# Patient Record
Sex: Male | Born: 1945 | Race: White | Hispanic: No | Smoking: Never smoker
Health system: Southern US, Community
[De-identification: ages and names within clinical notes are randomized; demographics above are authoritative.]

## PROBLEM LIST (undated history)

## (undated) DIAGNOSIS — F259 Schizoaffective disorder, unspecified: Secondary | ICD-10-CM

## (undated) DIAGNOSIS — F0390 Unspecified dementia without behavioral disturbance: Secondary | ICD-10-CM

## (undated) DIAGNOSIS — F250 Schizoaffective disorder, bipolar type: Secondary | ICD-10-CM

## (undated) DIAGNOSIS — I679 Cerebrovascular disease, unspecified: Secondary | ICD-10-CM

## (undated) DIAGNOSIS — R569 Unspecified convulsions: Secondary | ICD-10-CM

---

## 2005-07-22 ENCOUNTER — Inpatient Hospital Stay: Payer: Self-pay | Admitting: Unknown Physician Specialty

## 2006-10-27 ENCOUNTER — Ambulatory Visit: Payer: Self-pay | Admitting: Cardiovascular Disease

## 2008-06-09 ENCOUNTER — Ambulatory Visit: Payer: Self-pay | Admitting: Internal Medicine

## 2008-08-08 ENCOUNTER — Ambulatory Visit: Payer: Self-pay | Admitting: Gastroenterology

## 2008-11-16 ENCOUNTER — Ambulatory Visit: Payer: Self-pay | Admitting: Internal Medicine

## 2009-01-14 ENCOUNTER — Emergency Department: Payer: Self-pay | Admitting: Internal Medicine

## 2011-02-21 ENCOUNTER — Inpatient Hospital Stay: Payer: Self-pay | Admitting: Orthopedic Surgery

## 2011-03-07 ENCOUNTER — Emergency Department: Payer: Self-pay | Admitting: Emergency Medicine

## 2011-11-05 ENCOUNTER — Ambulatory Visit: Payer: Self-pay | Admitting: Urology

## 2011-11-05 DIAGNOSIS — I10 Essential (primary) hypertension: Secondary | ICD-10-CM

## 2011-11-05 LAB — BASIC METABOLIC PANEL
Anion Gap: 7 (ref 7–16)
BUN: 15 mg/dL (ref 7–18)
Chloride: 102 mmol/L (ref 98–107)
Co2: 33 mmol/L — ABNORMAL HIGH (ref 21–32)
EGFR (Non-African Amer.): >60
Osmolality: 283 (ref 275–301)
Potassium: 4.6 mmol/L (ref 3.5–5.1)
Sodium: 142 mmol/L (ref 136–145)

## 2011-11-05 LAB — HEMOGLOBIN: HGB: 15.6 g/dL (ref 13.0–18.0)

## 2011-11-12 ENCOUNTER — Ambulatory Visit: Payer: Self-pay | Admitting: Urology

## 2011-11-14 LAB — PATHOLOGY REPORT

## 2012-01-19 ENCOUNTER — Ambulatory Visit: Payer: Self-pay

## 2012-02-16 ENCOUNTER — Inpatient Hospital Stay: Payer: Self-pay | Admitting: Psychiatry

## 2012-02-16 LAB — CBC
HCT: 43.3 % (ref 40.0–52.0)
HGB: 14.8 g/dL (ref 13.0–18.0)
MCHC: 34.3 g/dL (ref 32.0–36.0)
RBC: 4.79 10*6/uL (ref 4.40–5.90)
WBC: 5.5 10*3/uL (ref 3.8–10.6)

## 2012-02-16 LAB — SALICYLATE LEVEL: Salicylates, Serum: <1.7 mg/dL

## 2012-02-16 LAB — TSH: Thyroid Stimulating Horm: 2.01 u[IU]/mL

## 2012-02-16 LAB — COMPREHENSIVE METABOLIC PANEL
Anion Gap: 9 (ref 7–16)
BUN: 17 mg/dL (ref 7–18)
Bilirubin,Total: 0.4 mg/dL (ref 0.2–1.0)
Calcium, Total: 9.4 mg/dL (ref 8.5–10.1)
Chloride: 105 mmol/L (ref 98–107)
Co2: 26 mmol/L (ref 21–32)
Creatinine: 0.69 mg/dL (ref 0.60–1.30)
EGFR (African American): >60
EGFR (Non-African Amer.): >60
Potassium: 3.9 mmol/L (ref 3.5–5.1)
SGOT(AST): 46 U/L — ABNORMAL HIGH (ref 15–37)
SGPT (ALT): 45 U/L (ref 12–78)
Sodium: 140 mmol/L (ref 136–145)
Total Protein: 7.1 g/dL (ref 6.4–8.2)

## 2012-02-16 LAB — DRUG SCREEN, URINE
Amphetamines, Ur Screen: NEGATIVE (ref ?–1000)
Barbiturates, Ur Screen: NEGATIVE (ref ?–200)
Cocaine Metabolite,Ur ~~LOC~~: NEGATIVE (ref ?–300)
MDMA (Ecstasy)Ur Screen: NEGATIVE (ref ?–500)
Methadone, Ur Screen: NEGATIVE (ref ?–300)
Opiate, Ur Screen: NEGATIVE (ref ?–300)
Phencyclidine (PCP) Ur S: NEGATIVE (ref ?–25)
Tricyclic, Ur Screen: NEGATIVE (ref ?–1000)

## 2012-02-16 LAB — ETHANOL: Ethanol: <3 mg/dL

## 2012-02-16 LAB — VALPROIC ACID LEVEL: Valproic Acid: 74 ug/mL

## 2012-02-16 LAB — ACETAMINOPHEN LEVEL: Acetaminophen: <2 ug/mL

## 2012-02-22 LAB — VALPROIC ACID LEVEL: Valproic Acid: 93 ug/mL

## 2012-02-29 LAB — VALPROIC ACID LEVEL: Valproic Acid: 97 ug/mL

## 2014-07-12 NOTE — Op Note (Signed)
PATIENT NAME:  Mason May, Mason May MR#:  [id number] DATE OF BIRTH:  1945-10-02  DATE OF PROCEDURE:  11/12/2011  PREOPERATIVE DIAGNOSIS: Bladder tumor.   POSTOPERATIVE DIAGNOSIS: Bladder tumor.  PROCEDURES: Cystoscopy, bladder biopsy, and mitomycin bladder instillation.   SURGEON: Assunta GamblesBrian [place], M.D.   ANESTHESIA: Laryngeal mask airway anesthesia.   INDICATIONS: The patient is a 69 year old gentleman who presented for evaluation of hematuria and detrusor instability. On examination he was noted to have an approximate 1 to 1.5 cm right lateral wall papillary-appearing tumor consistent with early papillary transitional cell carcinoma. He presents for biopsy and mitomycin bladder instillation.   DESCRIPTION OF PROCEDURE: After informed consent was obtained, the patient was taken to the Operating Room and placed in the dorsal lithotomy position under laryngeal mask airway anesthesia. The patient was then prepped and draped in the usual standard fashion. The 22 French rigid cystoscope was introduced into the urethra under direct vision. A urethral false passage was noted at the 5 o'clock position in mid bulbar urethra. Upon entering the prostate, moderate bilobar hypertrophy was noted with partial visual obstruction. Upon entering the bladder, the mucosa was inspected in its entirety. Several small diverticula were noted with the predominate being on the left lateral wall. Just in front of the area of the diverticulum was an approximate 1 to 1.5 cm papillary-appearing lesion consistent with early papillary transitional cell carcinoma. No other lesions were noted throughout the bladder. Prominent trabeculation was present. Cold cup biopsy forceps were then utilized to remove the lesion. The area was then extensively cauterized up to the opening of the diverticulum. No significant bleeding was encountered. The bladder was drained, the cystoscope was removed, and an 18 French red rubber catheter was then placed without  difficulty. 20 mg of mitomycin reconstituted in 50 mL of sterile water was administered into the bladder. The catheter was then removed. The patient was     returned to the supine position and awakened from laryngeal mask airway anesthesia. He was taken to the recovery room in stable condition. There were no problems or complications. The patient tolerated the procedure well. ____________________________ Madolyn FriezeBrian S. Achilles Dunkope, MD bsc:slb D: 11/12/2011 08:22:42 ET T: 11/12/2011 10:47:26 ET JOB#: [id number]  cc: Madolyn FriezeBrian S. Achilles Dunkope, MD, <Dictator> Madolyn FriezeBRIAN S Lessly Stigler MD ELECTRONICALLY SIGNED 11/14/2011 8:59

## 2014-07-12 NOTE — Consult Note (Signed)
Brief Consult Note: Diagnosis: 1. LE edema - likely dependent edema (no evidence of cellulitis) 2. Schizophrenia 3. hx of CAD 4. HTN 5. Hypothyroidism 6. Restless leg syndrome. 7. BPH 8. Urinary Incontinence.   Patient was seen by consultant.   Consult note dictated.   Orders entered.   Comments: 69 yo  male w/ hx of Schizophrenia, HTN, CAD, Hyperlipidemia, restless leg syndrome, hypothyroidism, BPH, Urinary incotinence came into hospital due to auditory hallucinations/delusions due to schizophrenia.    1. LE swelling/redness - unlikely cellulitis but likely dependent edema from PVD.   - will get Doppler to r/o DVt.  [facility] and keep leg elevated when not ambulating.  - no abx at this time.  2. HTN - cont. Metoprolol, HCTZ 3. Hypothyroidism - cont. synthroid.  4. Restless leg - cont. Requip.  5. BPH - cont. Flomax.   6. Schizophrenia/delusions - cont. care as per Psych.   Full Code Thanks for the consult.  Job # [id number].  Electronic Signatures: [place] SirenSainani, Vivek J (MD)  (Signed ([phone]-Nov-13 17:12)  Authored: Brief Consult Note   Last Updated: 30-Nov-13 17:12 by [place] SirenSainani, Vivek J (MD)

## 2014-07-12 NOTE — Discharge Summary (Signed)
PATIENT NAME:  Quinteros, Mason May MR#:  [id number] DATE OF BIRTH:  [birth year]  DATE OF ADMISSION:  02/16/2012 DATE OF DISCHARGE:  03/04/2012  HOSPITAL COURSE: See dictated history and physical for details of admission. This 69 year old man was brought to the hospital under involuntary commitment because of increase in paranoid behavior and agitation with conflict with his neighbors. He had become convinced that people were somehow piping gases into his house that he could smell and had become more disruptive in his milieu, was frequently calling the law. In the hospital the patient was initially quite disorganized and bizarre in his appearance and behavior. He was also fairly agitated and belligerent at times. He was not violent or threatening, however, and at no time voiced any suicidal ideation. Psychiatrically he was treated with medication as well as individual and group therapy focusing on psychoeducation and cognitive and supportive therapy. His medications primarily have been Seroquel and Depakote. We have tried to find a dosage of medication that will treat his psychotic manic symptoms but also be agreeable to the patient and cause as few side effects as possible. After some titration we seem to have achieved that. He seems to only be able to tolerate 200 mg total of Seroquel a day without getting oversedated. He particularly is very sedated if he takes any in the morning and is very resistant to increasing the nighttime dose. Higher doses of Depakote also seem to cause some slurring of his speech and weakness. He is tolerating his current medication profile well, however. He has become more appropriate in his appearance and shows adequate ability to take care of himself. He is not reporting having any hallucinations and does not appear paranoid currently. He is agreeable to following up with Dr. Janeece RiggersSu in the community. As far as his medical treatment, we have continued to treat his blood pressure, his urinary  incontinence and his hypercholesterolemia as well as his chronic obstructive pulmonary disease, hypothyroidism and gastric reflux and restless legs. Some minor changes have been made in medication but overall he has stayed stable. Patient understands his medical condition and is agreeable to staying on all of his medicine and to following up with his physician's assistant in the community. At the time of discharge he is not suicidal, aggressive, violent and is agreeable to the outpatient treatment plan.   DISCHARGE MEDICATIONS:  1. Quetiapine 200 mg at bedtime.  2. Colace 200 mg twice a day.  3. Depakote 500 mg twice a day.  4. Lorazepam 2 mg every six hours p.r.n. for anxiety. I have educated the patient that he should not take this medicine more than twice a day at most and should keep his doses at least six preferably eight hours apart and should only take it if he is feeling panicky in a way that he cannot manage on his own.  5. Flomax 0.4 mg per day.  6. Requip 2 mg per day, which he prefers to take in the morning.  7. Ditropan 5 mg twice a day.  8. Prilosec 20 mg in the morning.  9. Synthroid 50 mcg per day.  10. Hydrochlorothiazide 25 mg per day.  11. Advair Diskus 1 puff twice a day.  12. Zetia 10 mg per day.  13. Celebrex 200 mg per day.   LABORATORY, DIAGNOSTIC AND RADIOLOGICAL DATA: Admission labs done on the 24th showed a drug screen that was negative, valproic acid level of 74, TSH 2.01 which is normal, alcohol undetectable, chemistry panel without significant abnormalities.  Slightly elevated AST, but only at 46. Hematology panel entirely normal. Acetaminophen and salicylates undetectable. Blood gas showed pO2 slightly low at 65. I am not sure if that really is an accurate test given that it does not reflect any problems with shortness of breath he is having. CT of the head was done and showed chronic involutional changes but no acute abnormality, valproic acid level retested on 11/30  was 93. it was 97 on 12/07. Ultrasound was done of the lower extremity to rule out deep vein thrombosis and did not show any sign of a thrombus.   DISCHARGE MENTAL STATUS EXAMINATION: Neatly dressed and groomed. Good eye contact. Normal psychomotor activity. Speech normal in rate, tone, and volume. Affect somewhat blunted but not grossly bizarre. Mood stated as being good. Thoughts are lucid without any loosening of associations or delusional thinking. Denies auditory or visual hallucinations. Denies any olfactory hallucinations. Denies suicidal or homicidal ideation. Judgment and insight are improved. Baseline intelligence normal. Short and long-term memory intact. Alert and oriented x4.   DISPOSITION: Discharge back to independent living. His sister will be taking him home. He is to follow-up with Dr. Janeece Riggers and with his physician's assistant for primary care.   DIAGNOSIS PRINCIPLE AND PRIMARY: AXIS I: Bipolar disorder, manic, with psychotic features, now resolved.   SECONDARY DIAGNOSES:  AXIS I: No further.   AXIS II: Deferred.   AXIS III:  1. Hypertension.  2. Elevated cholesterol.  3. Chronic obstructive pulmonary disease. 4. Chronic pain in his left knee and ankle. 5. Hypothyroidism. 6. Gastric reflux symptoms.  7. Constipation. 8. Urinary incontinence probably related to prostate problem.   AXIS IV: Moderate. Chronic stress from isolation and mental illness.   AXIS V: Functioning at time of discharge 60.   ____________________________ Audery Amel, MD jtc:cms D: 03/04/2012 11:59:55 ET T: 03/04/2012 12:10:18 ET JOB#: [id number]  cc: Audery Amel, MD, <Dictator> Audery Amel MD ELECTRONICALLY SIGNED 03/04/2012 13:55

## 2014-07-12 NOTE — H&P (Signed)
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 Patient reports in 1996 when his wife died he started living by himself in a one bedroom apartment. Patient comes for inpatient [facility] in [facility] with a chief complaint "It is not my problem. It is my neighbor's problem. They think something is wrong with me. I don't even pass by them. I don't even walk around them and I have been living there longer than them."   HISTORY OF PRESENT ILLNESS: According to information obtained from the chart patient had been paranoid and agitated and hostile and deranging property. For several weeks he has been smelling vapors coming through the wall of his apartment. He stated that his neighboring apartment was using gas but that now they are using hair spray. He has woken his other neighbors in the middle of the night and has called the fire department out to the residence on multiple occasions. He was shown several times that there is nothing in the apartment that he is talking about and nothing has been discovered to come from the presence of any harmful substances. There are no smells that officers or fire fighters have been able to detect. He has jammed clothes hangers in his windows and doors and has attempted to stuff things on the residences of the neighbors. He has also been trying to get to  his neighbors with clothes hangers. He is believed paranoid and is dangerous to himself or neighbors. Patient absolutely denies all these statements and he said the neighbors came in after he was living there and they are accusing him of things that he has not done. In fact patient has been wearing dark glasses and reports that there is some vision  problem and he is sensitive to the light and he has been told the same by his physicians.  PAST PSYCHIATRIC HISTORY: Patient reports that his first inpatient hospitalization in psychiatry [place] when he admitted himself on a voluntary basis for evaluation in 1969. He was inpatient for two months. Longest period of inpatient hospitalization in psychiatry [place] two months in [place]. Last inpatient hospitalization was in 2007 to [facility] when he was a patient of Dr. Alycia Rossettiyan and was sent to state hospital for further help and he did not like it. He tried to kill himself on one occasion when he tried to overdose of pills. Currently he is being [place]. Last appointment a couple of weeks ago and next appointment is on 02/18/2012. Patient reports that Seroquel and Depakote are the medications that helped him the best.   FAMILY HISTORY OF MENTAL ILLNESS: Not known for mental illness. No known history of suicides in the family.   FAMILY HISTORY: Raised by mother until they were divorced and then by father and stepmother. Father was a Psychologist, occupationalwelder. Father died of cancer and he does not know his age. Mother worked for an Scientist, forensicinsurance company and an Film/video editoroffice worker. Stepmother was a Armed forces operational officerdental hygienist. Mother and stepmother died. Has four living sisters and three living brothers. Not close to them as they all live in [place].  PERSONAL HISTORY: [place], [place] most of the time. Moved  to [place] in 2005 to be closer to his siblings who lived here at that time. Graduated from high school. One year of college in [place], [place] and quit because it did not work out as he was playing baseball and had to spend most of the time playing baseball and had to work 20 hours a week to keep himself going.   WORK HISTORY: First job cigarettes Customer service manager at 20 years. This job lasted for two months. Quit because of having to  move and difficulties with living situation. 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   MARRIAGES: Married once. Marriage lasted for 12 years until she died. No children because she could not have any children but had two stepchildren.  ALCOHOL AND DRUGS: First drink of alcohol never. Denies street or prescription drug abuse. Denies smoking nicotine cigarettes.   PAST MEDICAL HISTORY: Mild hypertension which is labile. No known history of diabetes mellitus. Status post arthroscopy surgery on left knee. Status post surgery for pilonidal cyst removal on the back many years ago. No history of motor vehicle accident, never been unconscious. No known drug allergies. Was being followed at [facility] by Dr. Mariana Kaufman. Last appointment was with the PA about a week ago. Next appointment is on 03/05/2012. He is on Flomax, aspirin 81 mg every day, multivitamin, metoprolol, levothyroxine for hypothyroidism.     PHYSICAL EXAMINATION: VITAL SIGNS: Temperature 97.6, pulse 90 per minute regular, respirations 20 per minute regular, blood pressure 140/90 mmHg.   HEENT: Head is normocephalic, atraumatic. Pupils are equal, round, and reactive to light and accommodation. Fundi bilaterally benign. Extraocular movements visualized. Tympanic membrane visualized, no exudates. Edentulous, moist.   NECK: Supple without any organomegaly, lymphadenopathy, thyromegaly.  CHEST: Normal expansion. Normal breath sounds heard.   ABDOMEN: Soft. No organomegaly. Bowel sounds heard.   RECTAL: Deferred.  NEUROLOGICAL: Gait is normal. Romberg is negative. Cranial nerves II through XII grossly intact. DTRs 2+ and normal. Plantars normal response.  MENTAL STATUS EXAM: Patient is dressed in street clothes, alert and oriented to place, person and time, pleasant, calm and cooperative. No agitation.  Affect is appropriate with his mood which is not depressed and he denies feeling depressed and he smiled while talking about the same. Admits that his neighbors are all against him and he moved in a long time before them and they are talking about him and are against him. In fact he is wearing dark glasses and said that he is sensitive to the light and he was told by his doctors about the same. When he was asked about his behavior he reports that he did not do it and it was correct that there was some smell coming and he had to take care of it. He is paranoid and suspicious about people around him. Denies any paranoid or suspicious ideas and reports that these are all true things and in fact his neighbors are paranoid about him. He could spell the word world forward and backward. He could do serial sevens. He could count money. He could tell his date of birth. Denies any ideas or plans to hurt himself or others. Insight and judgment guarded/impaired.  IMPRESSION: AXIS I: Schizophrenia, chronic paranoid exacerbation.   AXIS II: Deferred.  AXIS III: 1. Status post arthroscopic surgery of his left knee-remote.  2. Hypertension-mild.  3.  Hypothyroidism.   AXIS IV: Severe-occupational, financial, long history of mental illness and noncompliance with medication.  AXIS V: Global Assessment of Functioning 25.  PLAN: Patient admitted to [facility] for close observation, evaluation and help. He will be started back on all of his medications and he will be stabilized. During the stay in the hospital compliance issues will be addressed. Social Services will try to contact mental health about his follow-up appointment and compliance issues. He will be stabilized and appropriate follow-up appointment will be made at the time of discharge.   ____________________________ Jannet Mantis. Guss Bunde, MD skc:cms D: 02/16/2012 18:48:00 ET T: 02/17/2012 07:04:16 ET JOB#: [id number]  cc: Monika Salk K. Guss Bunde, MD,  <Dictator> Beau Fanny MD ELECTRONICALLY SIGNED 02/18/2012 20:06

## 2014-07-12 NOTE — Consult Note (Signed)
PATIENT NAME:  Mason May, Mason May MR#:  [id number] DATE OF BIRTH:  [birth year]  DATE OF CONSULTATION:  02/22/2012  REFERRING PHYSICIAN:  Jolanta B. Jennet Maduro, MD  CONSULTING PHYSICIAN:  Rolly Pancake. Cherlynn Kaiser, MD PRIMARY CARE PHYSICIAN: Mason [place], MD    REASON FOR CONSULTATION: Lower extremity swelling and redness.   HISTORY OF PRESENT ILLNESS: [name] who was admitted to Behavioral Medicine for schizophrenia with auditory hallucinations and delusions. Hospitalist Services were contacted as he has been having some lower extremity swelling and redness, more on the left compared to the right, with also some red areas suspicious for cellulitis. The patient is a poor historian given his underlying psychiatric illness. As per the patient, he has been having swelling in his left leg for about a month or so, and the right leg started to get swollen maybe about a week or so ago. He does not complain of any pain on ambulation. He does note that there are some red areas which have appeared. He denies any fevers, chills, nausea, vomiting, abdominal pain or any other associated symptoms presently.   REVIEW OF SYSTEMS: CONSTITUTIONAL: No documented fever. No weight gain, no weight loss. EYES: No blurry or double vision. ENT: No tinnitus, no postnasal drip, no redness of the oropharynx. RESPIRATORY: No cough, no wheeze, no hemoptysis. CARDIOVASCULAR: No chest pain, no orthopnea, no palpitations, no syncope. GASTROINTESTINAL: No nausea, no vomiting, no diarrhea, no abdominal pain, no melena or hematochezia. GU: No dysuria, no hematuria. ENDOCRINE: No polyuria or nocturia. No heat or cold intolerance. HEMATOLOGIC/LYMPHATIC: No anemia, no bruising, no bleeding. INTEGUMENTARY: No rashes, no lesions. MUSCULOSKELETAL: No arthritis, no swelling, no gout. NEUROLOGIC: No numbness, tingling, no ataxia, no seizure-type activity. PSYCHIATRIC: No anxiety, no insomnia, no ADD.   PAST MEDICAL HISTORY:  1. Coronary artery  disease.  2. Hypertension.  3. Hypothyroidism.  4. Restless leg syndrome.  5. Benign prostatic hypertrophy.  6. Urinary incontinence.  7. Schizophrenia.  8. Hyperlipidemia.   ALLERGIES: No known drug allergies.   SOCIAL HISTORY: No smoking. No alcohol abuse. No illicit drug abuse. He lives by himself.   FAMILY HISTORY: [name] died from complications of congestive heart failure. Father died from a malignancy of unknown type.   CURRENT MEDICATIONS:  1. Aspirin 81 mg daily.  2. Benztropine 1 mg b.i.d.  3. Celebrex 200 mg daily.  4. Cymbalta 30 mg daily.  5. Depakote 5 mg b.i.d.  6. Colace 100 mg daily.  7. Metoprolol tartrate 25 mg b.i.d.  8. Omeprazole 20 mg daily.  9. Oxybutynin 15 mg b.i.d.  10. Requip 2 mg at bedtime.  11. Risperidone 2 mg at bedtime.  12. Seroquel 100 mg at bedtime.  13. Synthroid 50 mcg daily.  14. Flomax 0.4 mg in the morning and also at bedtime.  15. Zetia 10 mg daily.   PHYSICAL EXAMINATION:  VITAL SIGNS: Presently, temperature is 97.7, pulse 75, respirations 20, blood pressure 143/71.   GENERAL: The patient is a pleasant-appearing male in no apparent distress.   HEENT: Atraumatic, normocephalic. Extraocular muscles are intact. Pupils are equal and reactive to light. Sclerae are anicteric. No conjunctival injection. No pharyngeal erythema.   NECK: Supple. There is no jugular venous distention, no bruits, no lymphadenopathy. No thyromegaly.   HEART: Regular rate and rhythm. No murmurs, rubs, or clicks.   LUNGS: Clear to auscultation bilaterally. No rales, no rhonchi, no wheezes.   ABDOMEN: Soft, flat, nontender, nondistended. Good bowel sounds. No hepatosplenomegaly appreciated.  EXTREMITIES: No evidence of any cyanosis or clubbing. Does have +2 pitting edema from the knees to the ankles bilaterally. He also has some excoriations on his lower extremities bilaterally which appear like scratches.   SKIN: Moist and warm with no  rashes.   LYMPHATIC: There is no cervical or axillary lymphadenopathy.   NEUROLOGICAL: He is alert, awake, and oriented x3 with no focal motor or sensory deficits appreciated bilaterally.   ASSESSMENT AND PLAN: [name] with a history of schizophrenia, hypertension, coronary artery disease, hyperlipidemia, restless leg syndrome, hypothyroidism, benign prostatic hypertrophy, urinary incontinence, who presented to the hospital due to auditory  hallucinations, delusions, admitted to Behavioral Medicine. Hospitalist Services were contacted for lower extremity edema and redness.   1. Lower extremity edema and redness: This is likely dependent edema from peripheral vascular disease and poor mobility, unlikely cellulitis. The patient is afebrile, does not have an elevated Zalenski cell count. His leg is not fluctuant or red consistent with cellulitis. I will get Dopplers of his lower extremities to just rule out a deep vein thrombosis. I will place a TED hose from the knees to the ankles bilaterally and tell him to keep his leg elevated when not ambulating. I will not prescribe any antibiotics at this time.  2. Hypertension: He is presently hemodynamically stable. I will continue his metoprolol and hydrochlorothiazide.  3. Hypothyroidism: Continue with his Synthroid.  4. Restless leg syndrome: Continue Requip. 5. Benign prostatic hypertrophy: Continue Flomax.  6. Schizophrenia/delusions: Continue care as per Psychiatry.   CODE STATUS:  The patient is a FULL CODE.    Thank you so much for the consultation. I will sign off for now. Call back if any further help is needed. If the patient's Doppler of his lower extremities are positive, I will come back and re-evaluate the patient.   TIME SPENT WITH CONSULT:   45 minutes.   ____________________________ Rolly PancakeVivek J. Cherlynn KaiserSainani, MD vjs:cbb D: 02/22/2012 17:13:37 ET T: 02/23/2012 11:11:55 ET JOB#: [id number]  cc: Rolly PancakeVivek J. Cherlynn KaiserSainani, MD,  <Dictator> Vic RipperPaul C. Mariana Kaufmanobin, MD [place] SirenVIVEK J SAINANI MD ELECTRONICALLY SIGNED 02/27/2012 20:31

## 2016-03-26 ENCOUNTER — Emergency Department: Payer: Medicare Other

## 2016-03-26 ENCOUNTER — Encounter: Payer: Self-pay | Admitting: Emergency Medicine

## 2016-03-26 ENCOUNTER — Inpatient Hospital Stay
Admission: EM | Admit: 2016-03-26 | Discharge: 2016-04-01 | DRG: 871 | Disposition: A | Discharge status: Skilled Nursing Facility | Payer: Medicare Other | Attending: Internal Medicine | Admitting: Internal Medicine

## 2016-03-26 DIAGNOSIS — J189 Pneumonia, unspecified organism: Secondary | ICD-10-CM | POA: Diagnosis present

## 2016-03-26 DIAGNOSIS — A419 Sepsis, unspecified organism: Secondary | ICD-10-CM | POA: Diagnosis not present

## 2016-03-26 DIAGNOSIS — F250 Schizoaffective disorder, bipolar type: Secondary | ICD-10-CM | POA: Diagnosis not present

## 2016-03-26 DIAGNOSIS — F259 Schizoaffective disorder, unspecified: Secondary | ICD-10-CM | POA: Diagnosis present

## 2016-03-26 DIAGNOSIS — E872 Acidosis: Secondary | ICD-10-CM | POA: Diagnosis present

## 2016-03-26 DIAGNOSIS — E039 Hypothyroidism, unspecified: Secondary | ICD-10-CM | POA: Diagnosis present

## 2016-03-26 DIAGNOSIS — R4182 Altered mental status, unspecified: Secondary | ICD-10-CM | POA: Diagnosis not present

## 2016-03-26 DIAGNOSIS — Z8673 Personal history of transient ischemic attack (TIA), and cerebral infarction without residual deficits: Secondary | ICD-10-CM

## 2016-03-26 DIAGNOSIS — R4189 Other symptoms and signs involving cognitive functions and awareness: Secondary | ICD-10-CM | POA: Insufficient documentation

## 2016-03-26 DIAGNOSIS — Z79899 Other long term (current) drug therapy: Secondary | ICD-10-CM

## 2016-03-26 DIAGNOSIS — Z66 Do not resuscitate: Secondary | ICD-10-CM | POA: Diagnosis present

## 2016-03-26 DIAGNOSIS — N485 Ulcer of penis: Secondary | ICD-10-CM | POA: Diagnosis present

## 2016-03-26 DIAGNOSIS — J9621 Acute and chronic respiratory failure with hypoxia: Secondary | ICD-10-CM | POA: Diagnosis present

## 2016-03-26 DIAGNOSIS — Z7951 Long term (current) use of inhaled steroids: Secondary | ICD-10-CM | POA: Diagnosis not present

## 2016-03-26 DIAGNOSIS — Z9981 Dependence on supplemental oxygen: Secondary | ICD-10-CM

## 2016-03-26 DIAGNOSIS — F22 Delusional disorders: Secondary | ICD-10-CM | POA: Diagnosis present

## 2016-03-26 DIAGNOSIS — Z7952 Long term (current) use of systemic steroids: Secondary | ICD-10-CM | POA: Diagnosis not present

## 2016-03-26 DIAGNOSIS — R569 Unspecified convulsions: Secondary | ICD-10-CM | POA: Diagnosis not present

## 2016-03-26 DIAGNOSIS — J849 Interstitial pulmonary disease, unspecified: Secondary | ICD-10-CM

## 2016-03-26 DIAGNOSIS — N390 Urinary tract infection, site not specified: Secondary | ICD-10-CM | POA: Diagnosis present

## 2016-03-26 DIAGNOSIS — R2681 Unsteadiness on feet: Secondary | ICD-10-CM

## 2016-03-26 DIAGNOSIS — I679 Cerebrovascular disease, unspecified: Secondary | ICD-10-CM | POA: Diagnosis present

## 2016-03-26 DIAGNOSIS — F0390 Unspecified dementia without behavioral disturbance: Secondary | ICD-10-CM | POA: Diagnosis present

## 2016-03-26 DIAGNOSIS — J9611 Chronic respiratory failure with hypoxia: Secondary | ICD-10-CM

## 2016-03-26 HISTORY — DX: Unspecified dementia, unspecified severity, without behavioral disturbance, psychotic disturbance, mood disturbance, and anxiety: F03.90

## 2016-03-26 HISTORY — DX: Unspecified convulsions: R56.9

## 2016-03-26 HISTORY — DX: Cerebrovascular disease, unspecified: I67.9

## 2016-03-26 HISTORY — DX: Schizoaffective disorder, bipolar type: F25.0

## 2016-03-26 HISTORY — DX: Schizoaffective disorder, unspecified: F25.9

## 2016-03-26 LAB — URINALYSIS, COMPLETE (UACMP) WITH MICROSCOPIC
BACTERIA UA: NONE SEEN
Bilirubin Urine: NEGATIVE
Glucose, UA: NEGATIVE mg/dL
KETONES UR: 80 mg/dL — AB
Leukocytes, UA: NEGATIVE
Nitrite: NEGATIVE
PROTEIN: 100 mg/dL — AB
Specific Gravity, Urine: 1.024 (ref 1.005–1.030)
pH: 5 (ref 5.0–8.0)

## 2016-03-26 LAB — COMPREHENSIVE METABOLIC PANEL
ALBUMIN: 3.9 g/dL (ref 3.5–5.0)
ALK PHOS: 51 U/L (ref 38–126)
ALT: 32 U/L (ref 17–63)
AST: 50 U/L — AB (ref 15–41)
Anion gap: 13 (ref 5–15)
BUN: 12 mg/dL (ref 6–20)
CHLORIDE: 97 mmol/L — AB (ref 101–111)
CO2: 28 mmol/L (ref 22–32)
CREATININE: 0.7 mg/dL (ref 0.61–1.24)
Calcium: 9 mg/dL (ref 8.9–10.3)
GFR calc Af Amer: >60 mL/min (ref >60)
GFR calc non Af Amer: >60 mL/min (ref >60)
GLUCOSE: 91 mg/dL (ref 65–99)
Potassium: 3.4 mmol/L — ABNORMAL LOW (ref 3.5–5.1)
SODIUM: 138 mmol/L (ref 135–145)
Total Bilirubin: 0.7 mg/dL (ref 0.3–1.2)
Total Protein: 6.9 g/dL (ref 6.5–8.1)

## 2016-03-26 LAB — URINE DRUG SCREEN, QUALITATIVE (ARMC ONLY)
Amphetamines, Ur Screen: NOT DETECTED
BENZODIAZEPINE, UR SCRN: NOT DETECTED
Barbiturates, Ur Screen: NOT DETECTED
Cannabinoid 50 Ng, Ur ~~LOC~~: NOT DETECTED
Cocaine Metabolite,Ur ~~LOC~~: NOT DETECTED
MDMA (ECSTASY) UR SCREEN: NOT DETECTED
Methadone Scn, Ur: NOT DETECTED
Opiate, Ur Screen: POSITIVE — AB
PHENCYCLIDINE (PCP) UR S: NOT DETECTED
TRICYCLIC, UR SCREEN: NOT DETECTED

## 2016-03-26 LAB — CBC
HCT: 38.5 % — ABNORMAL LOW (ref 40.0–52.0)
HEMOGLOBIN: 13 g/dL (ref 13.0–18.0)
MCH: 29.2 pg (ref 26.0–34.0)
MCHC: 33.8 g/dL (ref 32.0–36.0)
MCV: 86.2 fL (ref 80.0–100.0)
Platelets: 228 10*3/uL (ref 150–440)
RBC: 4.47 MIL/uL (ref 4.40–5.90)
RDW: 14.3 % (ref 11.5–14.5)
WBC: 13.6 10*3/uL — AB (ref 3.8–10.6)

## 2016-03-26 LAB — LACTIC ACID, PLASMA
LACTIC ACID, VENOUS: 3.9 mmol/L — AB (ref 0.5–1.9)
LACTIC ACID, VENOUS: 1.1 mmol/L (ref 0.5–1.9)

## 2016-03-26 LAB — INFLUENZA PANEL BY PCR (TYPE A & B)
INFLAPCR: NEGATIVE
INFLBPCR: NEGATIVE

## 2016-03-26 LAB — TROPONIN I: Troponin I: <0.03 ng/mL (ref <0.03)

## 2016-03-26 LAB — VALPROIC ACID LEVEL: Valproic Acid Lvl: 39 ug/mL — ABNORMAL LOW (ref 50.0–100.0)

## 2016-03-26 LAB — MRSA PCR SCREENING: MRSA BY PCR: NEGATIVE

## 2016-03-26 MED ORDER — PIPERACILLIN-TAZOBACTAM 3.375 G IVPB 30 MIN
3.3750 g | Freq: Once | INTRAVENOUS | Status: AC
  Administered 2016-03-26: 3.375 g via INTRAVENOUS
  Filled 2016-03-26: qty 50

## 2016-03-26 MED ORDER — ATORVASTATIN CALCIUM 20 MG PO TABS
40.0000 mg | ORAL_TABLET | Freq: Every day | ORAL | Status: DC
  Administered 2016-03-26 – 2016-03-31 (×5): 40 mg via ORAL
  Filled 2016-03-26 (×5): qty 2

## 2016-03-26 MED ORDER — ALFUZOSIN HCL ER 10 MG PO TB24
10.0000 mg | ORAL_TABLET | Freq: Every day | ORAL | Status: DC
  Administered 2016-03-27 – 2016-04-01 (×6): 10 mg via ORAL
  Filled 2016-03-26 (×6): qty 1

## 2016-03-26 MED ORDER — SODIUM CHLORIDE 0.9 % IV SOLN
1000.0000 mL | Freq: Once | INTRAVENOUS | Status: AC
  Administered 2016-03-26: 1000 mL via INTRAVENOUS

## 2016-03-26 MED ORDER — PANTOPRAZOLE SODIUM 40 MG PO TBEC
40.0000 mg | DELAYED_RELEASE_TABLET | Freq: Every day | ORAL | Status: DC
  Administered 2016-03-26 – 2016-04-01 (×7): 40 mg via ORAL
  Filled 2016-03-26 (×7): qty 1

## 2016-03-26 MED ORDER — LORAZEPAM 2 MG/ML IJ SOLN
2.0000 mg | Freq: Four times a day (QID) | INTRAMUSCULAR | Status: DC | PRN
  Administered 2016-03-27 – 2016-04-01 (×3): 2 mg via INTRAVENOUS
  Filled 2016-03-26 (×3): qty 1

## 2016-03-26 MED ORDER — TRAMADOL HCL 50 MG PO TABS
50.0000 mg | ORAL_TABLET | Freq: Four times a day (QID) | ORAL | Status: DC | PRN
  Administered 2016-03-26 – 2016-04-01 (×8): 50 mg via ORAL
  Filled 2016-03-26 (×8): qty 1

## 2016-03-26 MED ORDER — SODIUM CHLORIDE 0.9 % IV SOLN
INTRAVENOUS | Status: DC
  Administered 2016-03-26 – 2016-03-28 (×3): via INTRAVENOUS

## 2016-03-26 MED ORDER — SODIUM CHLORIDE 0.9% FLUSH
3.0000 mL | Freq: Two times a day (BID) | INTRAVENOUS | Status: DC
  Administered 2016-03-26 – 2016-04-01 (×9): 3 mL via INTRAVENOUS

## 2016-03-26 MED ORDER — MOMETASONE FURO-FORMOTEROL FUM 200-5 MCG/ACT IN AERO
2.0000 | INHALATION_SPRAY | Freq: Two times a day (BID) | RESPIRATORY_TRACT | Status: DC
  Administered 2016-03-26 – 2016-04-01 (×12): 2 via RESPIRATORY_TRACT
  Filled 2016-03-26: qty 8.8

## 2016-03-26 MED ORDER — NYSTATIN 100000 UNIT/GM EX POWD
Freq: Three times a day (TID) | CUTANEOUS | Status: DC
  Administered 2016-03-26 – 2016-04-01 (×16): via TOPICAL
  Filled 2016-03-26 (×2): qty 15

## 2016-03-26 MED ORDER — MYCOPHENOLATE MOFETIL 250 MG PO CAPS
1500.0000 mg | ORAL_CAPSULE | Freq: Two times a day (BID) | ORAL | Status: DC
  Administered 2016-03-26 – 2016-04-01 (×12): 1500 mg via ORAL
  Filled 2016-03-26 (×12): qty 6

## 2016-03-26 MED ORDER — BENZONATATE 100 MG PO CAPS: 100.0000 mg | ORAL_CAPSULE | Freq: Three times a day (TID) | ORAL | Status: DC | PRN

## 2016-03-26 MED ORDER — DIVALPROEX SODIUM 500 MG PO DR TAB
500.0000 mg | DELAYED_RELEASE_TABLET | Freq: Two times a day (BID) | ORAL | Status: DC
  Administered 2016-03-26 – 2016-03-28 (×4): 500 mg via ORAL
  Filled 2016-03-26 (×4): qty 1

## 2016-03-26 MED ORDER — VANCOMYCIN HCL IN DEXTROSE 1-5 GM/200ML-% IV SOLN
1000.0000 mg | Freq: Two times a day (BID) | INTRAVENOUS | Status: DC
  Administered 2016-03-26 – 2016-03-27 (×3): 1000 mg via INTRAVENOUS
  Filled 2016-03-26 (×4): qty 200

## 2016-03-26 MED ORDER — DULOXETINE HCL 30 MG PO CPEP
30.0000 mg | ORAL_CAPSULE | Freq: Every day | ORAL | Status: DC
  Administered 2016-03-26 – 2016-04-01 (×7): 30 mg via ORAL
  Filled 2016-03-26 (×7): qty 1

## 2016-03-26 MED ORDER — PREDNISONE 10 MG PO TABS
10.0000 mg | ORAL_TABLET | Freq: Every day | ORAL | Status: DC
  Administered 2016-03-26 – 2016-04-01 (×7): 10 mg via ORAL
  Filled 2016-03-26 (×7): qty 1

## 2016-03-26 MED ORDER — DARIFENACIN HYDROBROMIDE ER 7.5 MG PO TB24
7.5000 mg | ORAL_TABLET | Freq: Every day | ORAL | Status: DC
  Administered 2016-03-26 – 2016-04-01 (×7): 7.5 mg via ORAL
  Filled 2016-03-26 (×7): qty 1

## 2016-03-26 MED ORDER — RISPERIDONE 1 MG PO TABS
2.0000 mg | ORAL_TABLET | Freq: Two times a day (BID) | ORAL | Status: DC
  Administered 2016-03-26 – 2016-04-01 (×12): 2 mg via ORAL
  Filled 2016-03-26 (×12): qty 2

## 2016-03-26 MED ORDER — HEPARIN SODIUM (PORCINE) 5000 UNIT/ML IJ SOLN
5000.0000 [IU] | Freq: Three times a day (TID) | INTRAMUSCULAR | Status: DC
  Administered 2016-03-26 – 2016-04-01 (×17): 5000 [IU] via SUBCUTANEOUS
  Filled 2016-03-26 (×18): qty 1

## 2016-03-26 MED ORDER — VANCOMYCIN HCL IN DEXTROSE 1-5 GM/200ML-% IV SOLN
1000.0000 mg | Freq: Once | INTRAVENOUS | Status: AC
  Administered 2016-03-26: 1000 mg via INTRAVENOUS
  Filled 2016-03-26: qty 200

## 2016-03-26 MED ORDER — LEVOTHYROXINE SODIUM 50 MCG PO TABS
50.0000 ug | ORAL_TABLET | Freq: Every day | ORAL | Status: DC
  Administered 2016-03-27 – 2016-04-01 (×6): 50 ug via ORAL
  Filled 2016-03-26 (×6): qty 1

## 2016-03-26 MED ORDER — PIPERACILLIN-TAZOBACTAM 3.375 G IVPB
3.3750 g | Freq: Three times a day (TID) | INTRAVENOUS | Status: DC
  Administered 2016-03-26 – 2016-03-31 (×14): 3.375 g via INTRAVENOUS
  Filled 2016-03-26 (×14): qty 50

## 2016-03-26 NOTE — ED Triage Notes (Signed)
Pt presents to ED c/o AMS. EMS report they were called out for a "sick call." Reports pt lives alone, house was extremely cold and pt did not want to run gas heater because he uses home O2. Reports pt had seizure in truck PTA. Pt is confused on arrival and somewhat combative. D/[facility] where he was seen for schizophrenia.

## 2016-03-26 NOTE — Progress Notes (Signed)
Sister, Cherylann RatelJanis, called to say that patient's..." Psychiatrist that prescribes the Risperdol, is not associated with any hospital; They were trying to wean him off of it...and were going to try Invega".  Windy Carinaurner,Masayoshi Couzens K, RN10:46 PM 03/26/2016

## 2016-03-26 NOTE — ED Triage Notes (Signed)
Pt's sister states pt is confused at baseline, hx dementia and cerebral vascular disease. [facility]. Pt has not started abt.

## 2016-03-26 NOTE — Progress Notes (Signed)
RN received admission information from sister Mason May. Pt is alert to self and time. Best number to reach Mason May at is [phone].  Jomaira Darr [employer]

## 2016-03-26 NOTE — ED Provider Notes (Addendum)
[facility] Emergency Department Provider Note   ____________________________________________    I have reviewed the triage vital signs and the nursing notes.   HISTORY  Chief Complaint Altered Mental Status and Seizures  History limited by altered mental status   HPI Yunior Jain is a 71 y.o. male with a history of dementia and schizophrenia who presents with altered mental status. Patient apparently lives alone, was found down in his apartment with the heat turned off. Was recently treated at [facility] per EMS. Patient is reportedly on oxygen at home, questionable history of seizures.   Past Medical History:  Diagnosis Date  . Cerebral vascular disease   . Dementia   . Schizo affective schizophrenia (HCC)   . Seizures (HCC)     There are no active problems to display for this patient.   History reviewed. No pertinent surgical history.  Prior to Admission medications   Not on File     Allergies Patient has no known allergies.  History reviewed. No pertinent family history.  Social History Social History  Substance Use Topics  . Smoking status: Never Smoker  . Smokeless tobacco: Never Used  . Alcohol use No    Level V caveat: Unable to obtain Review of Systems due to altered mental status    ____________________________________________   PHYSICAL EXAM:  VITAL SIGNS: ED Triage Vitals  Enc Vitals Group     BP 03/26/16 1057 (!) 150/76     Pulse Rate 03/26/16 1057 (!) 119     Resp 03/26/16 1057 (!) 23     Temp 03/26/16 1057 98.7 F (37.1 C)     Temp Source 03/26/16 1057 Oral     SpO2 03/26/16 1057 94 %     Weight 03/26/16 1055 150 lb (68 kg)     Height 03/26/16 1055 5' 9.5" (1.765 m)     Head Circumference --      Peak Flow --      Pain Score --      Pain Loc --      Pain Edu? --      Excl. in GC? --     Constitutional: Alert, disoriented Eyes: Conjunctivae are normal.  Head: Mild erythema across the forehead, no  ecchymosis Nose: No congestion/rhinnorhea. No epistaxis or bruising Mouth/Throat: Mucous membranes are moist.   Neck:  Painless ROM, no vertebral tenderness to palpation Cardiovascular: Tachycardia, regular rhythm. Grossly normal heart sounds.  Good peripheral circulation. Respiratory: Normal respiratory effort.  No retractions. Lungs CTAB. Gastrointestinal: Soft and nontender. No distention.  No CVA tenderness. Genitourinary: deferred Musculoskeletal:  Warm and well perfused. Ecchymosis to the left lateral upper arm, mild bruising to the right lower extremity Neurologic:   No gross focal neurologic deficits are appreciated.  Skin:  Skin is warm, dry. Psychiatric: Unable to examine  ____________________________________________   LABS (all labs ordered are listed, but only abnormal results are displayed)  Labs Reviewed  CBC - Abnormal; Notable for the following:       Result Value   WBC 13.6 (*)    HCT 38.5 (*)    All other components within normal limits  COMPREHENSIVE METABOLIC PANEL - Abnormal; Notable for the following:    Potassium 3.4 (*)    Chloride 97 (*)    AST 50 (*)    All other components within normal limits  LACTIC ACID, PLASMA - Abnormal; Notable for the following:    Lactic Acid, Venous 3.9 (*)    All other  components within normal limits  CULTURE, BLOOD (ROUTINE X 2)  CULTURE, BLOOD (ROUTINE X 2)  URINE CULTURE  TROPONIN I  URINALYSIS, COMPLETE (UACMP) WITH MICROSCOPIC  URINE DRUG SCREEN, QUALITATIVE ([facility] ONLY)  LACTIC ACID, PLASMA   ____________________________________________  EKG  ED ECG REPORT I, Jene EveryKINNER, Loreli Debruler, the attending physician, personally viewed and interpreted this ECG.  Date: 03/26/2016 EKG Time: 1:50 PM Rate: 111 Rhythm: Sinus tachycardia QRS Axis: normal Intervals: normal ST/T Wave abnormalities: normal Conduction Disturbances: none   ____________________________________________  RADIOLOGY  CT head unremarkable Chest  x-ray consistent with interstitial lung disease, possible potential superimposed pneumonia ____________________________________________   PROCEDURES  Procedure(s) performed: No    Critical Care performed: No ____________________________________________   INITIAL IMPRESSION / ASSESSMENT AND PLAN / ED COURSE  Pertinent labs & imaging results that were available during my care of the patient were reviewed by me and considered in my medical decision making (see chart for details).  ----------------------------------------- 12:37 PM on 03/26/2016 -----------------------------------------   Patient presents with altered mental status, he is tachycardic as lactic acid is 3.9 his [place] count is elevated as well. He is immunocompromised on CellCept and prednisone for his lung disease. Strongly suspect sepsis, we will treat empirically and admit to the hospitalist for further management  Clinical Course    ____________________________________________   FINAL CLINICAL IMPRESSION(S) / ED DIAGNOSES  Final diagnoses:  Altered mental status, unspecified altered mental status type  Sepsis, due to unspecified organism [facility](HCC)      NEW MEDICATIONS STARTED DURING THIS VISIT:  New Prescriptions   No medications on file     Note:  This document was prepared using Dragon voice recognition software and may include unintentional dictation errors.    Jene Everyobert Nadia Viar, MD 03/26/16 [id number]    Jene Everyobert Romani Wilbon, MD 03/26/16 [phone]

## 2016-03-26 NOTE — Progress Notes (Signed)
Verbal order to place order for Ativan 2 mg Q6 PRN for seizures and agitation.   Mason May [employer]

## 2016-03-26 NOTE — Progress Notes (Signed)
Pt arrived on the unit, VSS. Pt is alert to self and time only. Bed alarm was placed , seizure pads were placed on the bed and central monitoring was placed on pt.   Tom Macpherson [employer]

## 2016-03-26 NOTE — Consult Note (Signed)
Pharmacy Antibiotic Note  Mason May is a 71 y.o. male admitted on 03/26/2016 with pneumonia and sepsis.  Pharmacy has been consulted for vancomycin/zosyn dosing. Pt is immunocomprosimed on cellcept and prednisone. Therefore no PCT ordered.   Plan: Pt received 1g of vancomycin in the ED. Will give next dose in 6 hours for stacked dosing. Vancomycin 1000mg  IV every 12 hours.  Goal trough 15-20 mcg/mL. Zosyn 3.375g IV q8h (4 hour infusion).  Trough at steady state 1/4 @ 0630  Height: 5' 9.5" (176.5 cm) Weight: 150 lb (68 kg) IBW/kg (Calculated) : 71.85  Temp (24hrs), Avg:98.7 F (37.1 C), Min:98.7 F (37.1 C), Max:98.7 F (37.1 C)   Recent Labs Lab 03/26/16 1120  WBC 13.6*  CREATININE 0.70  LATICACIDVEN 3.9*    Estimated Creatinine Clearance: 85 mL/min (by C-G formula based on SCr of 0.7 mg/dL).    No Known Allergies  Antimicrobials this admission: vancomcin 1/2 >>  zosyn 1/2 >>  Dose adjustments this admission:   Microbiology results: 1/2 BCx:  1/2 UCx:   1/2 MRSA PCR: needs to be collected 1/2 chest x-ray 1. Areas of bilateral interstitial and airspace lung opacity that are similar to the prior chest radiographs. Chronic areas of scarring suspected. Acute superimposed pneumonia is possible and should be considered likely if there are consistent clinical symptoms. No evidence pulmonary edema.  Thank you for allowing pharmacy to be a part of this patient's care.  Olene FlossMelissa D Linley Moskal, Pharm.D, BCPS Clinical Pharmacist  03/26/2016 1:52 PM

## 2016-03-26 NOTE — H&P (Signed)
[employer] [facility] [facility] [place] [facility]   PATIENT NAME: Mason May    MR#:  [id number]  DATE OF BIRTH:  Jan 19, 1946  DATE OF ADMISSION:  03/26/2016  PRIMARY CARE PHYSICIAN: No primary care provider on file.   REQUESTING/REFERRING PHYSICIAN: kinner  CHIEF COMPLAINT:   Chief Complaint  Patient presents with  . Altered Mental Status  . Seizures    HISTORY OF PRESENT ILLNESS: Mason May  is a 71 y.o. male with a known history of CVA, dementia, seizure, schizophrenia- for last 4-5 days was more confused and having some chills he was also having visual hallucinations and delusions as were his sister was present in the room at the time of my visit. Worried with this she took him to ER in [facility] days ago, where he was found to have UTI and was given some antibiotic injection 2 times while he was in ER for 12 hours. They wanted to admit him but because he was having his flight of thoughts due to schizophrenia he denied to get admitted and they discharged him home with prescriptions sent to his pharmacy for antibiotic. He did not receive the medicine so far so did not receive any antibiotics at home. Continue to be worsening and more confused so sister decided to call EMS and bring him to ER again. In ER he was noted to have a high lactic acid, confusion, bilateral pneumonia and he also had 2 episodes of seizures and one in EMS transportation and 1 in ER.\ Patient is confused and so all history obtained from his sister in the room.   PAST MEDICAL HISTORY:   Past Medical History:  Diagnosis Date  . Cerebral vascular disease   . Dementia   . Schizo affective schizophrenia (HCC)   . Seizures (HCC)     PAST SURGICAL HISTORY: History reviewed. No pertinent surgical history.  SOCIAL HISTORY:  Social History  Substance Use Topics  . Smoking status: Never Smoker  . Smokeless tobacco: Never Used  . Alcohol use No    FAMILY HISTORY:  Family History  Problem  Relation Age of Onset  . Kidney cancer Mother   . Lung cancer Father     DRUG ALLERGIES: No Known Allergies  REVIEW OF SYSTEMS:   Unable to get review of systems because patient is confused.  MEDICATIONS AT HOME:  Prior to Admission medications   Medication Sig Start Date End Date Taking? Authorizing Provider  alfuzosin (UROXATRAL) 10 MG 24 hr tablet Take 10 mg by mouth daily with breakfast.   Yes Historical Provider, MD  atorvastatin (LIPITOR) 40 MG tablet Take 40 mg by mouth daily.   Yes Historical Provider, MD  azithromycin (ZITHROMAX) 250 MG tablet Take 250 mg by mouth daily.   Yes Historical Provider, MD  benzonatate (TESSALON) 100 MG capsule Take 100 mg by mouth 3 (three) times daily as needed for cough.   Yes Historical Provider, MD  budesonide-formoterol (SYMBICORT) 160-4.5 MCG/ACT inhaler Inhale 2 puffs into the lungs 2 (two) times daily.   Yes Historical Provider, MD  divalproex (DEPAKOTE) 500 MG DR tablet Take 500 mg by mouth 2 (two) times daily.   Yes Historical Provider, MD  DULoxetine (CYMBALTA) 30 MG capsule Take 30 mg by mouth daily.   Yes Historical Provider, MD  levothyroxine (SYNTHROID, LEVOTHROID) 50 MCG tablet Take 50 mcg by mouth daily before breakfast.   Yes Historical Provider, MD  mycophenolate (CELLCEPT) 500 MG tablet Take 1,500 mg by mouth 2 (  two) times daily.   Yes Historical Provider, MD  nystatin (MYCOSTATIN/NYSTOP) powder Apply topically 3 (three) times daily.   Yes Historical Provider, MD  omeprazole (PRILOSEC) 40 MG capsule Take 40 mg by mouth daily.   Yes Historical Provider, MD  predniSONE (DELTASONE) 10 MG tablet Take 10 mg by mouth daily.   Yes Historical Provider, MD  risperiDONE (RISPERDAL) 2 MG tablet Take 2 mg by mouth 2 (two) times daily.   Yes Historical Provider, MD  traMADol (ULTRAM) 50 MG tablet Take 50 mg by mouth every 6 (six) hours as needed for severe pain.   Yes Historical Provider, MD  solifenacin (VESICARE) 10 MG tablet Take 10 mg by  mouth daily.    Historical Provider, MD      PHYSICAL EXAMINATION:   VITAL SIGNS: Blood pressure (!) 150/76, pulse (!) 119, temperature 98.7 F (37.1 C), temperature source Oral, resp. rate (!) 23, height 5' 9.5" (1.765 m), weight 68 kg (150 lb), SpO2 94 %.  GENERAL:  71 y.o.-year-old patient lying in the bed with no acute distress. Appears completely confused and moving his limbs trying to remove is covering and his clothes. EYES: Pupils equal, round, reactive to light and accommodation. No scleral icterus. Extraocular muscles intact.  HEENT: Head atraumatic, normocephalic. Oropharynx and nasopharynx clear.  NECK:  Supple, no jugular venous distention. No thyroid enlargement, no tenderness.  LUNGS: Normal breath sounds bilaterally, no wheezing,  some  crepitation. No use of accessory muscles of respiration.  CARDIOVASCULAR: S1, S2 normal. No murmurs, rubs, or gallops.  ABDOMEN: Soft, nontender, nondistended. Bowel sounds present. No organomegaly or mass. on his penis there is a small ulcer present   EXTREMITIES: No pedal edema, cyanosis, or clubbing.  NEUROLOGIC:  patient is completely confused, appears agitated. He is moving his limbs and trying to remove his diaper and his coverings. He is not following commands. ER nurses saw him having tonic-clonic seizures.  PSYCHIATRIC: The patient isconfused.  SKIN:  some redness on his left knee.  LABORATORY PANEL:   CBC  Recent Labs Lab 03/26/16 1120  WBC 13.6*  HGB 13.0  HCT 38.5*  PLT 228  MCV 86.2  MCH 29.2  MCHC 33.8  RDW 14.3   ------------------------------------------------------------------------------------------------------------------  Chemistries   Recent Labs Lab 03/26/16 1120  NA 138  K 3.4*  CL 97*  CO2 28  GLUCOSE 91  BUN 12  CREATININE 0.70  CALCIUM 9.0  AST 50*  ALT 32  ALKPHOS 51  BILITOT 0.7    ------------------------------------------------------------------------------------------------------------------ estimated creatinine clearance is 85 mL/min (by C-G formula based on SCr of 0.7 mg/dL). ------------------------------------------------------------------------------------------------------------------ No results for input(s): TSH, T4TOTAL, T3FREE, THYROIDAB in the last 72 hours.  Invalid input(s): FREET3   Coagulation profile No results for input(s): INR, PROTIME in the last 168 hours. ------------------------------------------------------------------------------------------------------------------- No results for input(s): DDIMER in the last 72 hours. -------------------------------------------------------------------------------------------------------------------  Cardiac Enzymes  Recent Labs Lab 03/26/16 1120  TROPONINI <0.03   ------------------------------------------------------------------------------------------------------------------ Invalid input(s): POCBNP  ---------------------------------------------------------------------------------------------------------------  Urinalysis    Component Value Date/Time   COLORURINE YELLOW (A) 03/26/2016 1120   APPEARANCEUR CLEAR (A) 03/26/2016 1120   LABSPEC 1.024 03/26/2016 1120   PHURINE 5.0 03/26/2016 1120   GLUCOSEU NEGATIVE 03/26/2016 1120   HGBUR MODERATE (A) 03/26/2016 1120   BILIRUBINUR NEGATIVE 03/26/2016 1120   KETONESUR 80 (A) 03/26/2016 1120   PROTEINUR 100 (A) 03/26/2016 1120   NITRITE NEGATIVE 03/26/2016 1120   LEUKOCYTESUR NEGATIVE 03/26/2016 1120     RADIOLOGY: Ct Head Wo Contrast  Result Date: 03/26/2016 CLINICAL DATA:  Altered mental status.  Seizure. EXAM: CT HEAD WITHOUT CONTRAST TECHNIQUE: Contiguous axial images were obtained from the base of the skull through the vertex without intravenous contrast. COMPARISON:  CT scan dated 02/16/2012 FINDINGS: Brain: No evidence of acute  infarction, hemorrhage, hydrocephalus, extra-axial collection or mass lesion/mass effect. Diffuse cerebral cortical and cerebellar atrophy, unchanged since the prior study. No ventricular dilatation. Vascular: No hyperdense vessel or unexpected calcification. Skull: Normal. Negative for fracture or focal lesion. Sinuses/Orbits: No acute finding. Other: None. IMPRESSION: No acute abnormality.  Chronic atrophy, stable. Electronically Signed   By: Francene BoyersJames  Maxwell M.D.   On: 03/26/2016 11:47   Dg Chest Port 1 View  Result Date: 03/26/2016 CLINICAL DATA:  Pt presents to ED c/o AMS. EMS report they were called out for a "sick call." Reports pt lives alone, house was extremely cold and pt did not want to run gas heater because he uses home O2. Reports pt had seizure in truck PTA. Pt is confused on arrival and somewhat combative. [facility] where he was seen for schizophrenia. EXAM: PORTABLE CHEST 1 VIEW COMPARISON:  CT, 02/26/2002.  Chest radiograph, 02/25/2011. FINDINGS: Cardiac silhouette is normal in size.  Mediastinal hilar masses. There are irregular bilateral interstitial airspace opacities, similar to the prior radiographs. This may reflect chronic scarring. Acute infiltrate not excluded but felt less likely. No evidence of pulmonary edema. No convincing pleural effusion. No pneumothorax. Old, healed rib fractures on the left. IMPRESSION: 1. Areas of bilateral interstitial and airspace lung opacity that are similar to the prior chest radiographs. Chronic areas of scarring suspected. Acute superimposed pneumonia is possible and should be considered likely if there are consistent clinical symptoms. No evidence pulmonary edema. Electronically Signed   By: Amie Portlandavid  Ormond M.D.   On: 03/26/2016 11:57    EKG: Orders placed or performed during the hospital encounter of 03/26/16  . ED EKG  . ED EKG  . EKG 12-Lead  . EKG 12-Lead    IMPRESSION AND PLAN:  * Sepsis, altered mental status, lactic  acidosis, bilateral pneumonia, healthcare to suggest a pneumonia      IV fluids, broad-spectrum antibiotics, blood culture and urine culture are sent.   Check for influenza and MRSA PCR.    * Seizures   Check Depakote level.   Neurologic consult.  * Hypothyroidism   Continue levothyroxine.  * Interstitial lung disease and on chronic immune suppressant.   Continue meds for now.  * Schizophrenia and hallucinations.   Psych consult.   All the records are reviewed and case discussed with ED provider. Management plans discussed with the patient, family and they are in agreement.  CODE STATUS: DO NOT RESUSCITATE  Code Status History    This patient does not have a recorded code status. Please follow your organizational policy for patients in this situation.     Patient's sister was present in the room during my visit.  TOTAL TIME TAKING CARE OF THIS PATIENT:  50 critical care minutes.    Altamese DillingVACHHANI, Mason May M.D on 03/26/2016   Between 7am to 6pm - Pager - [phone]  After 6pm go to [email or web address] - Social research officer, [email or web address] [email or web address]  [facility] [place] [facility]  Office  [phone]  CC: Primary care physician; No primary care provider on file.   Note: This dictation was prepared with Dragon dictation along with smaller phrase technology. Any transcriptional errors that result from this process are unintentional.

## 2016-03-26 NOTE — Progress Notes (Signed)
Family Meeting Note  Advance Directive:no  Today a meeting took place with the sister.  Patient is unable to participate due ZO:[id number]:Lacked capacity confused.   The following clinical team members were present during this meeting:confused  The following were discussed:Patient's diagnosis: schizophrenia, sepsis, Patient's progosis: Unable to determine and Goals for treatment: DNR  Additional follow-up to be provided: neurology and psych consult.  Time spent during discussion:20 minutes  Deloma Spindle, Heath GoldVAIBHAVKUMAR, MD
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
## 2016-03-26 NOTE — ED Notes (Signed)
Pt's sister Modena JanskyJanis Kim states she may be reached at [phone]

## 2016-03-27 DIAGNOSIS — F250 Schizoaffective disorder, bipolar type: Secondary | ICD-10-CM

## 2016-03-27 DIAGNOSIS — F259 Schizoaffective disorder, unspecified: Secondary | ICD-10-CM

## 2016-03-27 LAB — URINE CULTURE: Culture: NO GROWTH

## 2016-03-27 LAB — CBC
HEMATOCRIT: 37.1 % — AB (ref 40.0–52.0)
Hemoglobin: 12.4 g/dL — ABNORMAL LOW (ref 13.0–18.0)
MCH: 28.9 pg (ref 26.0–34.0)
MCHC: 33.5 g/dL (ref 32.0–36.0)
MCV: 86.3 fL (ref 80.0–100.0)
PLATELETS: 199 10*3/uL (ref 150–440)
RBC: 4.3 MIL/uL — AB (ref 4.40–5.90)
RDW: 14.7 % — ABNORMAL HIGH (ref 11.5–14.5)
WBC: 7.7 10*3/uL (ref 3.8–10.6)

## 2016-03-27 LAB — BASIC METABOLIC PANEL
ANION GAP: 9 (ref 5–15)
BUN: 7 mg/dL (ref 6–20)
CHLORIDE: 101 mmol/L (ref 101–111)
CO2: 29 mmol/L (ref 22–32)
Calcium: 8.3 mg/dL — ABNORMAL LOW (ref 8.9–10.3)
Creatinine, Ser: 0.58 mg/dL — ABNORMAL LOW (ref 0.61–1.24)
GFR calc non Af Amer: >60 mL/min (ref >60)
Glucose, Bld: 96 mg/dL (ref 65–99)
Potassium: 2.9 mmol/L — ABNORMAL LOW (ref 3.5–5.1)
SODIUM: 139 mmol/L (ref 135–145)

## 2016-03-27 MED ORDER — LORAZEPAM 0.5 MG PO TABS
0.2500 mg | ORAL_TABLET | Freq: Two times a day (BID) | ORAL | Status: DC
  Administered 2016-03-27 – 2016-03-29 (×6): 0.25 mg via ORAL
  Administered 2016-03-30: 0.5 mg via ORAL
  Administered 2016-03-30 – 2016-04-01 (×4): 0.25 mg via ORAL
  Filled 2016-03-27 (×11): qty 1

## 2016-03-27 MED ORDER — ONDANSETRON HCL 4 MG/2ML IJ SOLN: 4.0000 mg | Freq: Four times a day (QID) | INTRAMUSCULAR | Status: DC | PRN

## 2016-03-27 MED ORDER — POTASSIUM CHLORIDE CRYS ER 20 MEQ PO TBCR
40.0000 meq | EXTENDED_RELEASE_TABLET | ORAL | Status: AC
  Administered 2016-03-27: 40 meq via ORAL
  Filled 2016-03-27: qty 2
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
## 2016-03-27 NOTE — Progress Notes (Signed)
[employer] [facility] - [place] at [facility]   PATIENT NAME: Mason May    MR#:  [id number]  DATE OF BIRTH:  1945-05-07  SUBJECTIVE:  CHIEF COMPLAINT:   Chief Complaint  Patient presents with  . Altered Mental Status  . Seizures   Has cough. Some shortness of breath. Overall feels better.  REVIEW OF SYSTEMS:    Review of Systems  Unable to perform ROS: Mental acuity   DRUG ALLERGIES:  No Known Allergies  VITALS:  Blood pressure (!) 113/53, pulse 74, temperature 98.9 F (37.2 C), temperature source Oral, resp. rate 18, height 5' 9.5" (1.765 m), weight 68 kg (150 lb), SpO2 97 %.  PHYSICAL EXAMINATION:   Physical Exam  GENERAL:  71 y.o.-year-old patient lying in the bed with no acute distress.  EYES: Pupils equal, round, reactive to light and accommodation. No scleral icterus. Extraocular muscles intact.  HEENT: Head atraumatic, normocephalic. Oropharynx and nasopharynx clear.  NECK:  Supple, no jugular venous distention. No thyroid enlargement, no tenderness.  LUNGS: Normal breath sounds bilaterally, no wheezing, rales, rhonchi. No use of accessory muscles of respiration.  CARDIOVASCULAR: S1, S2 normal. No murmurs, rubs, or gallops.  ABDOMEN: Soft, nontender, nondistended. Bowel sounds present. No organomegaly or mass.  EXTREMITIES: No cyanosis, clubbing or edema b/l.    NEUROLOGIC: Cranial nerves II through XII are intact. No focal Motor or sensory deficits b/l.   PSYCHIATRIC: The patient is alert and oriented x 3.  SKIN: No obvious rash, lesion, or ulcer.   LABORATORY PANEL:   CBC  Recent Labs Lab 03/27/16 0424  WBC 7.7  HGB 12.4*  HCT 37.1*  PLT 199   ------------------------------------------------------------------------------------------------------------------ Chemistries   Recent Labs Lab 03/26/16 1120 03/27/16 0424  NA 138 139  K 3.4* 2.9*  CL 97* 101  CO2 28 29  GLUCOSE 91 96  BUN 12 7  CREATININE 0.70 0.58*  CALCIUM 9.0 8.3*   AST 50*  --   ALT 32  --   ALKPHOS 51  --   BILITOT 0.7  --    ------------------------------------------------------------------------------------------------------------------  Cardiac Enzymes  Recent Labs Lab 03/26/16 1120  TROPONINI <0.03   ------------------------------------------------------------------------------------------------------------------  RADIOLOGY:  Ct Head Wo Contrast  Result Date: 03/26/2016 CLINICAL DATA:  Altered mental status.  Seizure. EXAM: CT HEAD WITHOUT CONTRAST TECHNIQUE: Contiguous axial images were obtained from the base of the skull through the vertex without intravenous contrast. COMPARISON:  CT scan dated 02/16/2012 FINDINGS: Brain: No evidence of acute infarction, hemorrhage, hydrocephalus, extra-axial collection or mass lesion/mass effect. Diffuse cerebral cortical and cerebellar atrophy, unchanged since the prior study. No ventricular dilatation. Vascular: No hyperdense vessel or unexpected calcification. Skull: Normal. Negative for fracture or focal lesion. Sinuses/Orbits: No acute finding. Other: None. IMPRESSION: No acute abnormality.  Chronic atrophy, stable. Electronically Signed   By: Francene Boyers M.D.   On: 03/26/2016 11:47   Dg Chest Port 1 View  Result Date: 03/26/2016 CLINICAL DATA:  Pt presents to ED c/o AMS. EMS report they were called out for a "sick call." Reports pt lives alone, house was extremely cold and pt did not want to run gas heater because he uses home O2. Reports pt had seizure in truck PTA. Pt is confused on arrival and somewhat combative. D/c from [facility] ED 12/31 where he was seen for schizophrenia. EXAM: PORTABLE CHEST 1 VIEW COMPARISON:  CT, 02/26/2002.  Chest radiograph, 02/25/2011. FINDINGS: Cardiac silhouette is normal in size.  Mediastinal hilar masses. There are irregular bilateral interstitial airspace  opacities, similar to the prior radiographs. This may reflect chronic scarring. Acute infiltrate not excluded but felt  less likely. No evidence of pulmonary edema. No convincing pleural effusion. No pneumothorax. Old, healed rib fractures on the left. IMPRESSION: 1. Areas of bilateral interstitial and airspace lung opacity that are similar to the prior chest radiographs. Chronic areas of scarring suspected. Acute superimposed pneumonia is possible and should be considered likely if there are consistent clinical symptoms. No evidence pulmonary edema. Electronically Signed   By: Amie Portlandavid  Ormond M.D.   On: 03/26/2016 11:57     ASSESSMENT AND PLAN:   * Bibasilar pneumonia with sepsis and acute on chronic respiratory failure IV antibiotics. Influenza negative. Wean oxygen as tolerated. Nebulizers when necessary    * Seizures Consult neurology. Continue Depakote.  * Hypothyroidism   Continue levothyroxine.  * Interstitial lung disease and on chronic immune suppressant.   Continue meds for now.  * Schizophrenia and hallucinations.   Psychiatry consulted   All the records are reviewed and case discussed with Care Management/Social Workerr. Management plans discussed with the patient, family and they are in agreement.  CODE STATUS: FULL CODE  DVT Prophylaxis: SCDs  TOTAL TIME TAKING CARE OF THIS PATIENT: 35 minutes.   POSSIBLE D/C IN 2-3 DAYS, DEPENDING ON CLINICAL CONDITION.  Milagros LollSudini, Rylin Seavey R M.D on 03/27/2016 at 1:55 PM  Between 7am to 6pm - Pager - [phone]  After 6pm go to [email or web address] - password [email or web address] [facility]  [facility]  Office  [phone]  CC: Primary care physician; No primary care provider on file.  Note: This dictation was prepared with Dragon dictation along with smaller phrase technology. Any transcriptional errors that result from this process are unintentional.

## 2016-03-27 NOTE — Progress Notes (Signed)
Dr. Sheryle Hailiamond notified of K+ of 2.9. Acknowledged. Windy Carinaurner,Mariany Mackintosh K, RN 6:31 AM 03/27/2016

## 2016-03-27 NOTE — Consult Note (Signed)
Reason for Consult:Seizures Referring Physician: Sudini  CC: Seizures  HPI: Mason May is an 71 y.o. male who is unable to provide any history since he reports there is no history of seizures and he is unable to tell me why he is on Depakote.  From chart though it appears that the patient has a history of seizures.  On Depakote.  Was admitted due to a high lactic acid, confusion, bilateral pneumonia.  He also had 2 episodes of seizures and one in EMS during transportation and 1 in the ER.  Consult called for further recommendations.    Past Medical History:  Diagnosis Date  . Cerebral vascular disease   . Dementia   . Schizo affective schizophrenia (HCC)   . Seizures (HCC)     History reviewed. No pertinent surgical history.  Family History  Problem Relation Age of Onset  . Kidney cancer Mother   . Lung cancer Father     Social History:  reports that he has never smoked. He has never used smokeless tobacco. He reports that he does not drink alcohol or use drugs.  No Known Allergies  Medications:  I have reviewed the patient's current medications. Prior to Admission:  Prescriptions Prior to Admission  Medication Sig Dispense Refill Last Dose  . alfuzosin (UROXATRAL) 10 MG 24 hr tablet Take 10 mg by mouth daily with breakfast.   unknown at unknown  . atorvastatin (LIPITOR) 40 MG tablet Take 40 mg by mouth daily.   unknown at unknown  . azithromycin (ZITHROMAX) 250 MG tablet Take 250 mg by mouth daily.   unknown at unknown  . benzonatate (TESSALON) 100 MG capsule Take 100 mg by mouth 3 (three) times daily as needed for cough.   unknown at unknown  . budesonide-formoterol (SYMBICORT) 160-4.5 MCG/ACT inhaler Inhale 2 puffs into the lungs 2 (two) times daily.   unknown at unknown  . divalproex (DEPAKOTE) 500 MG DR tablet Take 500 mg by mouth 2 (two) times daily.   unknown at unknown  . DULoxetine (CYMBALTA) 30 MG capsule Take 30 mg by mouth daily.   unknown at unknown  .  levothyroxine (SYNTHROID, LEVOTHROID) 50 MCG tablet Take 50 mcg by mouth daily before breakfast.   unknown at unknown  . mycophenolate (CELLCEPT) 500 MG tablet Take 1,500 mg by mouth 2 (two) times daily.   unknown at unknown  . nystatin (MYCOSTATIN/NYSTOP) powder Apply topically 3 (three) times daily.   unknown at unknown  . omeprazole (PRILOSEC) 40 MG capsule Take 40 mg by mouth daily.   unknown at unknown  . predniSONE (DELTASONE) 10 MG tablet Take 10 mg by mouth daily.   unknown at unknown  . risperiDONE (RISPERDAL) 2 MG tablet Take 2 mg by mouth 2 (two) times daily.   unknown at unknown  . traMADol (ULTRAM) 50 MG tablet Take 50 mg by mouth every 6 (six) hours as needed for severe pain.   unknown at unknown  . solifenacin (VESICARE) 10 MG tablet Take 10 mg by mouth daily.   unknown at unknown   Scheduled: . alfuzosin  10 mg Oral Q breakfast  . atorvastatin  40 mg Oral Daily  . darifenacin  7.5 mg Oral Daily  . divalproex  500 mg Oral BID  . DULoxetine  30 mg Oral Daily  . heparin  5,000 Units Subcutaneous Q8H  . levothyroxine  50 mcg Oral QAC breakfast  . mometasone-formoterol  2 puff Inhalation BID  . mycophenolate  1,500 mg Oral BID  .  nystatin   Topical TID  . pantoprazole  40 mg Oral Daily  . piperacillin-tazobactam (ZOSYN)  IV  3.375 g Intravenous Q8H  . potassium chloride  40 mEq Oral Q4H  . predniSONE  10 mg Oral Daily  . risperiDONE  2 mg Oral BID  . sodium chloride flush  3 mL Intravenous Q12H  . vancomycin  1,000 mg Intravenous Q12H    ROS: History obtained from the patient  General ROS: negative for - chills, fatigue, fever, night sweats, weight gain or weight loss Psychological ROS: negative for - behavioral disorder, hallucinations, memory difficulties, mood swings or suicidal ideation Ophthalmic ROS: negative for - blurry vision, double vision, eye pain or loss of vision ENT ROS: negative for - epistaxis, nasal discharge, oral lesions, sore throat, tinnitus or  vertigo Allergy and Immunology ROS: negative for - hives or itchy/watery eyes Hematological and Lymphatic ROS: negative for - bleeding problems, bruising or swollen lymph nodes Endocrine ROS: negative for - galactorrhea, hair pattern changes, polydipsia/polyuria or temperature intolerance Respiratory ROS: negative for - cough, hemoptysis, shortness of breath or wheezing Cardiovascular ROS: left leg swelling Gastrointestinal ROS: negative for - abdominal pain, diarrhea, hematemesis, nausea/vomiting or stool incontinence Genito-Urinary ROS: negative for - dysuria, hematuria, incontinence or urinary frequency/urgency Musculoskeletal ROS: left arm soreness Neurological ROS: as noted in HPI Dermatological ROS: negative for rash and skin lesion changes  Physical Examination: Blood pressure (!) 113/53, pulse 74, temperature 98.9 F (37.2 C), temperature source Oral, resp. rate 18, height 5' 9.5" (1.765 m), weight 68 kg (150 lb), SpO2 97 %.  HEENT-  Normocephalic, no lesions, without obvious abnormality.  Normal external eye and conjunctiva.  Normal TM's bilaterally.  Normal auditory canals and external ears. Normal external nose, mucus membranes and septum.  Normal pharynx. Cardiovascular- S1, S2 normal, pulses palpable throughout   Lungs- chest clear, no wheezing, rales, normal symmetric air entry Abdomen- soft, non-tender; bowel sounds normal; no masses,  no organomegaly Extremities- left leg edema Lymph-no adenopathy palpable Musculoskeletal-no joint tenderness, deformity or swelling Skin-multiple areas of bruising  Neurological Examination Mental Status: Alert, oriented, thought content appropriate.  Speech fluent without evidence of aphasia.  Able to follow 3 step commands without difficulty. Cranial Nerves: II: Discs flat bilaterally; Visual fields grossly normal, pupils equal, round, reactive to light and accommodation III,IV, VI: ptosis not present, extra-ocular motions intact  bilaterally V,VII: smile symmetric, facial light touch sensation normal bilaterally VIII: hearing normal bilaterally IX,X: gag reflex present XI: bilateral shoulder shrug XII: midline tongue extension Motor: Generalized weakness with patient giving 5-/5 strength throughout Sensory: Pinprick and light touch intact throughout, bilaterally Deep Tendon Reflexes: 1+ and symmetric with absent AJ's bilaterally Plantars: Right: downgoing   Left: downgoing Cerebellar: Normal finger-to-nose and normal heel-to-shin testing bilaterally Gait: not tested due to safety concerns    Laboratory Studies:   Basic Metabolic Panel:  Recent Labs Lab 03/26/16 1120 03/27/16 0424  NA 138 139  K 3.4* 2.9*  CL 97* 101  CO2 28 29  GLUCOSE 91 96  BUN 12 7  CREATININE 0.70 0.58*  CALCIUM 9.0 8.3*    Liver Function Tests:  Recent Labs Lab 03/26/16 1120  AST 50*  ALT 32  ALKPHOS 51  BILITOT 0.7  PROT 6.9  ALBUMIN 3.9   No results for input(s): LIPASE, AMYLASE in the last 168 hours. No results for input(s): AMMONIA in the last 168 hours.  CBC:  Recent Labs Lab 03/26/16 1120 03/27/16 0424  WBC 13.6* 7.7  HGB  13.0 12.4*  HCT 38.5* 37.1*  MCV 86.2 86.3  PLT 228 199    Cardiac Enzymes:  Recent Labs Lab 03/26/16 1120  TROPONINI <0.03    BNP: Invalid input(s): POCBNP  CBG: No results for input(s): GLUCAP in the last 168 hours.  Microbiology: Results for orders placed or performed during the hospital encounter of 03/26/16  Blood Culture (routine x 2)     Status: None (Preliminary result)   Collection Time: 03/26/16 11:20 AM  Result Value Ref Range Status   Specimen Description BLOOD LEFT AC  Final   Special Requests   Final    BOTTLES DRAWN AEROBIC AND ANAEROBIC AER ANA   Culture NO GROWTH < 24 HOURS  Final   Report Status PENDING  Incomplete  Blood Culture (routine x 2)     Status: None (Preliminary result)   Collection Time: 03/26/16  2:30 PM  Result Value Ref  Range Status   Specimen Description BLOOD L AC  Final   Special Requests   Final    BOTTLES DRAWN AEROBIC AND ANAEROBIC AER ANA   Culture NO GROWTH < 24 HOURS  Final   Report Status PENDING  Incomplete  MRSA PCR Screening     Status: None   Collection Time: 03/26/16  3:34 PM  Result Value Ref Range Status   MRSA by PCR NEGATIVE NEGATIVE Final    Comment:        The GeneXpert MRSA Assay (FDA approved for NASAL specimens only), is one component of a comprehensive MRSA colonization surveillance program. It is not intended to diagnose MRSA infection nor to guide or monitor treatment for MRSA infections.     Coagulation Studies: No results for input(s): LABPROT, INR in the last 72 hours.  Urinalysis:  Recent Labs Lab 03/26/16 1120  COLORURINE YELLOW*  LABSPEC 1.024  PHURINE 5.0  GLUCOSEU NEGATIVE  HGBUR MODERATE*  BILIRUBINUR NEGATIVE  KETONESUR 80*  PROTEINUR 100*  NITRITE NEGATIVE  LEUKOCYTESUR NEGATIVE    Lipid Panel:  No results found for: CHOL, TRIG, HDL, CHOLHDL, VLDL, LDLCALC  HgbA1C: No results found for: HGBA1C  Urine Drug Screen:     Component Value Date/Time   LABOPIA POSITIVE (A) 03/26/2016 1120   COCAINSCRNUR NONE DETECTED 03/26/2016 1120   LABBENZ NONE DETECTED 03/26/2016 1120   AMPHETMU NONE DETECTED 03/26/2016 1120   THCU NONE DETECTED 03/26/2016 1120   LABBARB NONE DETECTED 03/26/2016 1120    Alcohol Level: No results for input(s): ETH in the last 168 hours.  Other results: EKG: sinus tachycardia at 111 bpm.  Imaging: Ct Head Wo Contrast  Result Date: 03/26/2016 CLINICAL DATA:  Altered mental status.  Seizure. EXAM: CT HEAD WITHOUT CONTRAST TECHNIQUE: Contiguous axial images were obtained from the base of the skull through the vertex without intravenous contrast. COMPARISON:  CT scan dated 02/16/2012 FINDINGS: Brain: No evidence of acute infarction, hemorrhage, hydrocephalus, extra-axial collection or mass lesion/mass effect. Diffuse  cerebral cortical and cerebellar atrophy, unchanged since the prior study. No ventricular dilatation. Vascular: No hyperdense vessel or unexpected calcification. Skull: Normal. Negative for fracture or focal lesion. Sinuses/Orbits: No acute finding. Other: None. IMPRESSION: No acute abnormality.  Chronic atrophy, stable. Electronically Signed   By: Francene Boyers M.D.   On: 03/26/2016 11:47   Dg Chest Port 1 View  Result Date: 03/26/2016 CLINICAL DATA:  Pt presents to ED c/o AMS. EMS report they were called out for a "sick call." Reports pt lives alone, house was extremely cold  and pt did not want to run gas heater because he uses home O2. Reports pt had seizure in truck PTA. Pt is confused on arrival and somewhat combative. D/c from [facility] ED 12/31 where he was seen for schizophrenia. EXAM: PORTABLE CHEST 1 VIEW COMPARISON:  CT, 02/26/2002.  Chest radiograph, 02/25/2011. FINDINGS: Cardiac silhouette is normal in size.  Mediastinal hilar masses. There are irregular bilateral interstitial airspace opacities, similar to the prior radiographs. This may reflect chronic scarring. Acute infiltrate not excluded but felt less likely. No evidence of pulmonary edema. No convincing pleural effusion. No pneumothorax. Old, healed rib fractures on the left. IMPRESSION: 1. Areas of bilateral interstitial and airspace lung opacity that are similar to the prior chest radiographs. Chronic areas of scarring suspected. Acute superimposed pneumonia is possible and should be considered likely if there are consistent clinical symptoms. No evidence pulmonary edema. Electronically Signed   By: Amie Portland M.D.   On: 03/26/2016 11:57     Assessment/Plan: 71 year old male with a history of seizures on Depakote presenting with infection.  Noted to have two seizures as well.  Head CT reviewed and shows no acute changes.  Seizure threshold likely decreased due to infection.  Depakote level low as well at 39.  From review of old level  patient is usually therapeutic.  Unclear if he was taking his medications correctly prior to admission since he had some confusion and hallucinations.    Recommendations: 1.  Seizure precautions 2.  Ativan prn seizures 3.  Repeat Depakote level in AM.  Will make dosage adjustments if level remains low.    Thana Farr, MD Neurology [phone] 03/27/2016, 2:05 PM

## 2016-03-27 NOTE — Consult Note (Signed)
[facility] Consult   Reason for Consult:  Consult for 71 year old man with a history of schizophrenia or schizoaffective disorder brought into the hospital with altered mental status and found to have a pneumonia Referring Physician:  [place] Patient Identification: Mason May MRN:  [id number] Principal Diagnosis: Sepsis [facility]) Diagnosis:   Patient Active Problem List   Diagnosis Date Noted  . 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 Sepsis ([place]) [A41.9] 03/26/2016  . Seizure ([place]) [R56.9] 03/26/2016  . UTI (urinary tract infection) [N39.0] 03/26/2016    Total Time spent with patient: 1 hour  Subjective:   Mason May is a 71 y.o. male patient admitted with "I guess I just wasn't doing well".  HPI:  Patient interviewed. Chart reviewed. Labs and vitals reviewed. Patient is a 71 year old man with chronic mental health problems and medical issues who was brought in unresponsive and also with reports of having witnessed seizures. On interview today the patient tells a rambling story about how he was trying to tidy up around his house. It's easy to redirect him and get some specific answers to questions but he is not really able to form a clear story of what brought him into the hospital. He is aware that he was told that he had had seizures but doesn't have any particular memory of it although he did say that he had been jerking in his limbs recently. Patient claims that he had been compliant with his medicines but he is a little vague as to the specifics. He tells me that he had been trying to taper himself off of his lorazepam but he is not able to tell me realistic doses. As far as current psychiatric symptoms the patient says his mood feels fine. He denies feeling depressed. He says that he feels like he has been sleeping okay. He denies any suicidal or homicidal thoughts. He denies any awareness of having hallucinations. On the other hand he tells me almost as  an afterthought that there are people who together around outside the window of his apartment and then when he falls asleep they come in and rape him. This sort of thing has been his typical chronic delusion.  Social history: Patient lives by himself in an apartment. We have some reports in the chart that it sounds like he's been in pretty bad shape. Just from his description of it it sounds like it is a mass and that maybe he has some hoarding tendencies. He admits that he doesn't turn his heat on because he is afraid that it will be too dangerous with his home oxygen. He has a sister who is his closest relative and checks up on him.  Medical history: Notably the patient does not have any past history of seizure disorder at all. He has a history of hypothyroidism. He uses home oxygen for reasons that aren't entirely clear to me. He has elevated lipids.  Substance abuse history: Patient has long been prescribed benzodiazepines by his outpatient psychiatrist. Multiple providers in hospital settings have made the recommendation that this ultimately be discontinued. Patient has shown a tendency to overuse benzodiazepines to his detriment in the past. Does not drink regularly and denies any other drug abuse.  Past Psychiatric History: Long history of psychotic disorder. Last hospitalization here was several years ago. It looks like he's had some follow-up visits at [facility] in the interim area patient denies ever having tried to kill himself or been violent in the past. His typical delusions are  believing that someone or something is coming into his house somehow and hurting him. He has been maintained on Depakote and antipsychotics. He tells me that he thinks that he is supposed to be taking Invega right now but the most recent note from [facility] indicates risperidone.  Risk to Self: Is patient at risk for suicide?: No Risk to Others:   Prior Inpatient Therapy:   Prior Outpatient Therapy:    Past Medical History:   Past Medical History:  Diagnosis Date  . Cerebral vascular disease   . Dementia   . 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 Seizures ([place])    History reviewed. No pertinent surgical history. Family History:  Family History  Problem Relation Age of Onset  . Kidney cancer Mother   . Lung cancer Father    Family Psychiatric  History: He is not aware of any family history of mental illness Social History:  History  Alcohol Use No     History  Drug Use No    Social History   Social History  . Marital status: Widowed    Spouse name: N/A  . Number of children: N/A  . Years of education: N/A   Social History Main Topics  . Smoking status: Never Smoker  . Smokeless tobacco: Never Used  . Alcohol use No  . Drug use: No  . Sexual activity: No   Other Topics Concern  . None   Social History Narrative  . None   Additional Social History:    Allergies:  No Known Allergies  Labs:  Results for orders placed or performed during the hospital encounter of 03/26/16 (from the past 48 hour(s))  CBC     Status: Abnormal   Collection Time: 03/26/16 11:20 AM  Result Value Ref Range   WBC 13.6 (H) 3.8 - 10.6 K/uL   RBC 4.47 4.40 - 5.90 MIL/uL   Hemoglobin 13.0 13.0 - 18.0 g/dL   HCT 38.5 (L) 40.0 - 52.0 %   MCV 86.2 80.0 - 100.0 fL   MCH 29.2 26.0 - 34.0 pg   MCHC 33.8 32.0 - 36.0 g/dL   RDW 14.3 11.5 - 14.5 %   Platelets 228 150 - 440 K/uL  Comprehensive metabolic panel     Status: Abnormal   Collection Time: 03/26/16 11:20 AM  Result Value Ref Range   Sodium 138 135 - 145 mmol/L   Potassium 3.4 (L) 3.5 - 5.1 mmol/L   Chloride 97 (L) 101 - 111 mmol/L   CO2 28 22 - 32 mmol/L   Glucose, Bld 91 65 - 99 mg/dL   BUN 12 6 - 20 mg/dL   Creatinine, Ser 0.70 0.61 - 1.24 mg/dL   Calcium 9.0 8.9 - 10.3 mg/dL   Total Protein 6.9 6.5 - 8.1 g/dL   Albumin 3.9 3.5 - 5.0 g/dL   AST 50 (H) 15 - 41 U/L   ALT 32 17 - 63 U/L   Alkaline Phosphatase 51 38 - 126 U/L   Total Bilirubin  0.7 0.3 - 1.2 mg/dL   GFR calc non Af Amer >60 >60 mL/min   GFR calc Af Amer >60 >60 mL/min    Comment: (NOTE) The eGFR has been calculated using the CKD EPI equation. This calculation has not been validated in all clinical situations. eGFR's persistently <60 mL/min signify possible Chronic Kidney Disease.    Anion gap 13 5 - 15  Troponin I     Status: None   Collection Time: 03/26/16 11:20  AM  Result Value Ref Range   Troponin I <0.03 <0.03 ng/mL  Urinalysis, Complete w Microscopic     Status: Abnormal   Collection Time: 03/26/16 11:20 AM  Result Value Ref Range   Color, Urine YELLOW (A) YELLOW   APPearance CLEAR (A) CLEAR   Specific Gravity, Urine 1.024 1.005 - 1.030   pH 5.0 5.0 - 8.0   Glucose, UA NEGATIVE NEGATIVE mg/dL   Hgb urine dipstick MODERATE (A) NEGATIVE   Bilirubin Urine NEGATIVE NEGATIVE   Ketones, ur 80 (A) NEGATIVE mg/dL   Protein, ur 100 (A) NEGATIVE mg/dL   Nitrite NEGATIVE NEGATIVE   Leukocytes, UA NEGATIVE NEGATIVE   RBC / HPF 0-5 0 - 5 RBC/hpf   WBC, UA 0-5 0 - 5 WBC/hpf   Bacteria, UA NONE SEEN NONE SEEN   Squamous Epithelial / LPF 0-5 (A) NONE SEEN   Mucous PRESENT   Urine Drug Screen, Qualitative ([facility] only)     Status: Abnormal   Collection Time: 03/26/16 11:20 AM  Result Value Ref Range   Tricyclic, Ur Screen NONE DETECTED NONE DETECTED   Amphetamines, Ur Screen NONE DETECTED NONE DETECTED   MDMA (Ecstasy)Ur Screen NONE DETECTED NONE DETECTED   Cocaine Metabolite,Ur [place] NONE DETECTED NONE DETECTED   Opiate, Ur Screen POSITIVE (A) NONE DETECTED   Phencyclidine (PCP) Ur S NONE DETECTED NONE DETECTED   Cannabinoid 50 Ng, Ur  NONE DETECTED NONE DETECTED   Barbiturates, Ur Screen NONE DETECTED NONE DETECTED   Benzodiazepine, Ur Scrn NONE DETECTED NONE DETECTED   Methadone Scn, Ur NONE DETECTED NONE DETECTED    Comment: (NOTE) 540  Tricyclics, urine               Cutoff 1000 ng/mL 200  Amphetamines, urine             Cutoff 1000 ng/mL 300  MDMA  (Ecstasy), urine           Cutoff 500 ng/mL 400  Cocaine Metabolite, urine       Cutoff 300 ng/mL 500  Opiate, urine                   Cutoff 300 ng/mL 600  Phencyclidine (PCP), urine      Cutoff 25 ng/mL 700  Cannabinoid, urine              Cutoff 50 ng/mL 800  Barbiturates, urine             Cutoff 200 ng/mL 900  Benzodiazepine, urine           Cutoff 200 ng/mL 1000 Methadone, urine                Cutoff 300 ng/mL 1100 1200 The urine drug screen provides only a preliminary, unconfirmed 1300 analytical test result and should not be used for non-medical 1400 purposes. Clinical consideration and professional judgment should 1500 be applied to any positive drug screen result due to possible 1600 interfering substances. A more specific alternate chemical method 1700 must be used in order to obtain a confirmed analytical result.  1800 Gas chromato graphy / mass spectrometry (GC/MS) is the preferred 1900 confirmatory method.   Lactic acid, plasma     Status: Abnormal   Collection Time: 03/26/16 11:20 AM  Result Value Ref Range   Lactic Acid, Venous 3.9 (HH) 0.5 - 1.9 mmol/L    Comment: CRITICAL RESULT CALLED TO, READ BACK BY AND VERIFIED WITH ALISHA GRANGER AT 1205 ON 03/26/2016 JLJ  Blood Culture (routine x 2)     Status: None (Preliminary result)   Collection Time: 03/26/16 11:20 AM  Result Value Ref Range   Specimen Description BLOOD LEFT AC    Special Requests      BOTTLES DRAWN AEROBIC AND ANAEROBIC AER 9ML ANA 9ML   Culture NO GROWTH < 24 HOURS    Report Status PENDING   Urine culture     Status: None   Collection Time: 03/26/16 11:20 AM  Result Value Ref Range   Specimen Description URINE, RANDOM    Special Requests NONE    Culture NO GROWTH Performed at [facility]     Report Status 03/27/2016 FINAL   Valproic acid level     Status: Abnormal   Collection Time: 03/26/16 11:20 AM  Result Value Ref Range   Valproic Acid Lvl 39 (L) 50.0 - 100.0 ug/mL  Lactic acid,  plasma     Status: None   Collection Time: 03/26/16  2:30 PM  Result Value Ref Range   Lactic Acid, Venous 1.1 0.5 - 1.9 mmol/L  Blood Culture (routine x 2)     Status: None (Preliminary result)   Collection Time: 03/26/16  2:30 PM  Result Value Ref Range   Specimen Description BLOOD L AC    Special Requests      BOTTLES DRAWN AEROBIC AND ANAEROBIC AER 6ML ANA 7ML   Culture NO GROWTH < 24 HOURS    Report Status PENDING   Influenza panel by PCR (type A & B, H1N1)     Status: None   Collection Time: 03/26/16  3:34 PM  Result Value Ref Range   Influenza A By PCR NEGATIVE NEGATIVE   Influenza B By PCR NEGATIVE NEGATIVE    Comment: (NOTE) The Xpert Xpress Flu assay is intended as an aid in the diagnosis of  influenza and should not be used as a sole basis for treatment.  This  assay is FDA approved for nasopharyngeal swab specimens only. Nasal  washings and aspirates are unacceptable for Xpert Xpress Flu testing.   MRSA PCR Screening     Status: None   Collection Time: 03/26/16  3:34 PM  Result Value Ref Range   MRSA by PCR NEGATIVE NEGATIVE    Comment:        The GeneXpert MRSA Assay (FDA approved for NASAL specimens only), is one component of a comprehensive MRSA colonization surveillance program. It is not intended to diagnose MRSA infection nor to guide or monitor treatment for MRSA infections.   Basic metabolic panel     Status: Abnormal   Collection Time: 03/27/16  4:24 AM  Result Value Ref Range   Sodium 139 135 - 145 mmol/L   Potassium 2.9 (L) 3.5 - 5.1 mmol/L   Chloride 101 101 - 111 mmol/L   CO2 29 22 - 32 mmol/L   Glucose, Bld 96 65 - 99 mg/dL   BUN 7 6 - 20 mg/dL   Creatinine, Ser 0.58 (L) 0.61 - 1.24 mg/dL   Calcium 8.3 (L) 8.9 - 10.3 mg/dL   GFR calc non Af Amer >60 >60 mL/min   GFR calc Af Amer >60 >60 mL/min    Comment: (NOTE) The eGFR has been calculated using the CKD EPI equation. This calculation has not been validated in all clinical  situations. eGFR's persistently <60 mL/min signify possible Chronic Kidney Disease.    Anion gap 9 5 - 15  CBC     Status: Abnormal  Collection Time: 03/27/16  4:24 AM  Result Value Ref Range   WBC 7.7 3.8 - 10.6 K/uL   RBC 4.30 (L) 4.40 - 5.90 MIL/uL   Hemoglobin 12.4 (L) 13.0 - 18.0 g/dL   HCT 37.1 (L) 40.0 - 52.0 %   MCV 86.3 80.0 - 100.0 fL   MCH 28.9 26.0 - 34.0 pg   MCHC 33.5 32.0 - 36.0 g/dL   RDW 14.7 (H) 11.5 - 14.5 %   Platelets 199 150 - 440 K/uL    Current Facility-Administered Medications  Medication Dose Route Frequency Provider Last Rate Last Dose  . 0.9 %  sodium chloride infusion   Intravenous Continuous Vaughan Basta, MD 100 mL/hr at 03/26/16 1618    . alfuzosin (UROXATRAL) 24 hr tablet 10 mg  10 mg Oral Q breakfast Vaughan Basta, MD   10 mg at 03/27/16 0731  . atorvastatin (LIPITOR) tablet 40 mg  40 mg Oral Daily Vaughan Basta, MD   40 mg at 03/26/16 1709  . benzonatate (TESSALON) capsule 100 mg  100 mg Oral TID PRN Vaughan Basta, MD      . darifenacin (ENABLEX) 24 hr tablet 7.5 mg  7.5 mg Oral Daily Vaughan Basta, MD   7.5 mg at 03/27/16 0959  . divalproex (DEPAKOTE) DR tablet 500 mg  500 mg Oral BID Vaughan Basta, MD   500 mg at 03/27/16 0959  . DULoxetine (CYMBALTA) DR capsule 30 mg  30 mg Oral Daily Vaughan Basta, MD   30 mg at 03/27/16 0959  . heparin injection 5,000 Units  5,000 Units Subcutaneous Q8H Vaughan Basta, MD   5,000 Units at 03/27/16 1432  . levothyroxine (SYNTHROID, LEVOTHROID) tablet 50 mcg  50 mcg Oral QAC breakfast Vaughan Basta, MD   50 mcg at 03/27/16 0731  . LORazepam (ATIVAN) injection 2 mg  2 mg Intravenous Q6H PRN Vaughan Basta, MD   2 mg at 03/27/16 1134  . LORazepam (ATIVAN) tablet 0.25 mg  0.25 mg Oral BID Gonzella Lex, MD      . mometasone-formoterol [facility]) 200-5 MCG/ACT inhaler 2 puff  2 puff Inhalation BID Vaughan Basta, MD   2 puff at  03/27/16 0731  . mycophenolate (CELLCEPT) capsule 1,500 mg  1,500 mg Oral BID Vaughan Basta, MD   1,500 mg at 03/27/16 0958  . nystatin (MYCOSTATIN/NYSTOP) topical powder   Topical TID Vaughan Basta, MD      . ondansetron (ZOFRAN) injection 4 mg  4 mg Intravenous Q6H PRN Srikar Sudini, MD      . pantoprazole (PROTONIX) EC tablet 40 mg  40 mg Oral Daily Vaughan Basta, MD   40 mg at 03/27/16 0959  . piperacillin-tazobactam (ZOSYN) IVPB 3.375 g  3.375 g Intravenous Q8H Melissa D Maccia, RPH   3.375 g at 03/27/16 1134  . predniSONE (DELTASONE) tablet 10 mg  10 mg Oral Daily Vaughan Basta, MD   10 mg at 03/27/16 0959  . risperiDONE (RISPERDAL) tablet 2 mg  2 mg Oral BID Vaughan Basta, MD   2 mg at 03/27/16 0959  . sodium chloride flush (NS) 0.9 % injection 3 mL  3 mL Intravenous Q12H Vaughan Basta, MD   3 mL at 03/27/16 1007  . traMADol (ULTRAM) tablet 50 mg  50 mg Oral Q6H PRN Vaughan Basta, MD   50 mg at 03/26/16 1709  . vancomycin (VANCOCIN) IVPB 1000 mg/200 mL premix  1,000 mg Intravenous Q12H Ramond Dial, RPH   1,000 mg at 03/27/16 0623  Musculoskeletal: Strength & Muscle Tone: decreased Gait & Station: unsteady Patient leans: N/A  Psychiatric Specialty Exam: Physical Exam  Nursing note and vitals reviewed. Constitutional: He appears well-developed and well-nourished.  HENT:  Head: Normocephalic and atraumatic.  Eyes: Conjunctivae are normal. Pupils are equal, round, and reactive to light.  Neck: Normal range of motion.  Cardiovascular: Normal heart sounds.   Respiratory: He is in respiratory distress.  GI: Soft.  Musculoskeletal: Normal range of motion.  Neurological: He is alert.  Skin: Skin is warm and dry.  Psychiatric: His affect is blunt. His speech is delayed. He is slowed. Thought content is paranoid and delusional. He expresses impulsivity. He expresses no homicidal and no suicidal ideation. He exhibits abnormal  recent memory and abnormal remote memory.    Review of Systems  Constitutional: Positive for malaise/fatigue.  HENT: Negative.   Eyes: Negative.   Respiratory: Positive for shortness of breath.   Cardiovascular: Negative.   Gastrointestinal: Negative.   Musculoskeletal: Negative.   Skin: Negative.   Neurological: Positive for weakness.  Psychiatric/Behavioral: Negative for depression, hallucinations, memory loss, substance abuse and suicidal ideas. The patient is not nervous/anxious and does not have insomnia.     Blood pressure (!) 113/53, pulse 74, temperature 98.9 F (37.2 C), temperature source Oral, resp. rate 18, height 5' 9.5" (1.765 m), weight 68 kg (150 lb), SpO2 97 %.Body mass index is 21.83 kg/m.  General Appearance: Disheveled  Eye Contact:  Fair  Speech:  Slow  Volume:  Decreased  Mood:  Euthymic  Affect:  Constricted  Thought Process:  Disorganized  Orientation:  Full (Time, Place, and Person)  Thought Content:  Paranoid Ideation and Tangential  Suicidal Thoughts:  No  Homicidal Thoughts:  No  Memory:  Immediate;   Good Recent;   Fair Remote;   Fair  Judgement:  Impaired  Insight:  Shallow  Psychomotor Activity:  Decreased  Concentration:  Concentration: Poor  Recall:  [employer] of Knowledge:  Fair  Language:  Fair  Akathisia:  No  Handed:  Right  AIMS (if indicated):     Assets:  Housing Social Support  ADL's:  Impaired  Cognition:  Impaired,  Mild  Sleep:        Treatment Plan Summary: Daily contact with patient to assess and evaluate symptoms and progress in treatment, Medication management and Plan 71 year old man with schizophrenia or schizoaffective disorder. He has been put back on his psychiatric medicine consistent with what he was taking previously although not on any standing lorazepam. I have restarted it at a much lower dose of 0.25 mg twice a day.. Patient probably had several factors contributing to his altered mental status and  presentation mostly his sepsis and his pneumonia. I'm not certain why he was having seizures given that he doesn't have a seizure disorder. One possibility would be that he had abruptly discontinued all of his Depakote. Another might be that he had overtaken his Ativan and then abruptly discontinued it. It sounds like his home situation is really not all that safe. Probably his sister should be involved before discharge and making sure that there is some kind of clear safety in his home living. Perhaps an Adult Protective Services consult .I am not going to make any changes to his medication at this point. He is certainly doing much better now than when he came into the hospital. His paranoia is probably somewhat chronic. I will continue to follow-up while he is here.  Disposition: Patient does not  meet criteria for psychiatric inpatient admission. Supportive therapy provided about ongoing stressors.  Alethia Berthold, MD 03/27/2016 3:13 PM

## 2016-03-27 NOTE — Plan of Care (Signed)
Problem: Physical Regulation: Goal: Ability to maintain clinical measurements within normal limits will improve Outcome: Progressing No seizure activity noted overnight; slept well; large incontinent urine output overnight; K+ 2.9 with new order written.

## 2016-03-28 ENCOUNTER — Inpatient Hospital Stay: Payer: Medicare Other

## 2016-03-28 DIAGNOSIS — R4182 Altered mental status, unspecified: Secondary | ICD-10-CM

## 2016-03-28 DIAGNOSIS — R4189 Other symptoms and signs involving cognitive functions and awareness: Secondary | ICD-10-CM | POA: Insufficient documentation

## 2016-03-28 DIAGNOSIS — R404 Transient alteration of awareness: Secondary | ICD-10-CM | POA: Insufficient documentation

## 2016-03-28 LAB — GLUCOSE, CAPILLARY: GLUCOSE-CAPILLARY: 121 mg/dL — AB (ref 65–99)

## 2016-03-28 LAB — BLOOD GAS, ARTERIAL
ACID-BASE EXCESS: 6.1 mmol/L — AB (ref 0.0–2.0)
BICARBONATE: 32.2 mmol/L — AB (ref 20.0–28.0)
FIO2: 0.32
O2 SAT: 95.9 %
PCO2 ART: 52 mmHg — AB (ref 32.0–48.0)
PH ART: 7.4 (ref 7.350–7.450)
PO2 ART: 81 mmHg — AB (ref 83.0–108.0)
Patient temperature: 37

## 2016-03-28 LAB — BASIC METABOLIC PANEL
ANION GAP: 4 — AB (ref 5–15)
ANION GAP: 5 (ref 5–15)
BUN: 8 mg/dL (ref 6–20)
BUN: 6 mg/dL (ref 6–20)
CALCIUM: 8.5 mg/dL — AB (ref 8.9–10.3)
CHLORIDE: 105 mmol/L (ref 101–111)
CO2: 29 mmol/L (ref 22–32)
CO2: 32 mmol/L (ref 22–32)
Calcium: 8.9 mg/dL (ref 8.9–10.3)
Chloride: 106 mmol/L (ref 101–111)
Creatinine, Ser: 0.54 mg/dL — ABNORMAL LOW (ref 0.61–1.24)
Creatinine, Ser: 0.69 mg/dL (ref 0.61–1.24)
GFR calc Af Amer: >60 mL/min (ref >60)
GFR calc non Af Amer: >60 mL/min (ref >60)
GLUCOSE: 102 mg/dL — AB (ref 65–99)
GLUCOSE: 145 mg/dL — AB (ref 65–99)
POTASSIUM: 2.9 mmol/L — AB (ref 3.5–5.1)
Potassium: 3.4 mmol/L — ABNORMAL LOW (ref 3.5–5.1)
SODIUM: 139 mmol/L (ref 135–145)
Sodium: 142 mmol/L (ref 135–145)

## 2016-03-28 LAB — TROPONIN I: Troponin I: <0.03 ng/mL (ref <0.03)

## 2016-03-28 LAB — CBC
HEMATOCRIT: 38.5 % — AB (ref 40.0–52.0)
HEMOGLOBIN: 12.7 g/dL — AB (ref 13.0–18.0)
MCH: 29 pg (ref 26.0–34.0)
MCHC: 33 g/dL (ref 32.0–36.0)
MCV: 87.8 fL (ref 80.0–100.0)
Platelets: 185 10*3/uL (ref 150–440)
RBC: 4.38 MIL/uL — ABNORMAL LOW (ref 4.40–5.90)
RDW: 14.7 % — ABNORMAL HIGH (ref 11.5–14.5)
WBC: 4.7 10*3/uL (ref 3.8–10.6)

## 2016-03-28 LAB — VALPROIC ACID LEVEL: VALPROIC ACID LVL: 47 ug/mL — AB (ref 50.0–100.0)

## 2016-03-28 LAB — AMMONIA: Ammonia: 28 umol/L (ref 9–35)

## 2016-03-28 LAB — VANCOMYCIN, TROUGH: VANCOMYCIN TR: 5 ug/mL — AB (ref 15–20)

## 2016-03-28 MED ORDER — LEVOFLOXACIN 500 MG PO TABS: 500.0000 mg | ORAL_TABLET | Freq: Every day | ORAL | 0 refills | Status: DC

## 2016-03-28 MED ORDER — SODIUM CHLORIDE 0.9 % IV SOLN
1000.0000 mg | INTRAVENOUS | Status: AC
  Administered 2016-03-28: 1000 mg via INTRAVENOUS
  Filled 2016-03-28: qty 10

## 2016-03-28 MED ORDER — VANCOMYCIN HCL 10 G IV SOLR
1250.0000 mg | Freq: Three times a day (TID) | INTRAVENOUS | Status: AC
  Administered 2016-03-28 (×2): 1250 mg via INTRAVENOUS
  Filled 2016-03-28 (×3): qty 1250

## 2016-03-28 MED ORDER — GADOBENATE DIMEGLUMINE 529 MG/ML IV SOLN
20.0000 mL | Freq: Once | INTRAVENOUS | Status: AC | PRN
  Administered 2016-03-28: 20 mL via INTRAVENOUS

## 2016-03-28 NOTE — Progress Notes (Signed)
Chaplain responded to a rapid response. Chaplain provided support to the medical team. There was no family member for Chaplain to support, but Chaplain did a follow up with the patient.

## 2016-03-28 NOTE — Progress Notes (Signed)
[facility] [place] [facility]   PATIENT NAME: Mason May    MR#:  [id number]  DATE OF BIRTH:  [birth year]  SUBJECTIVE:  CHIEF COMPLAINT:   Chief Complaint  Patient presents with  . Altered Mental Status  . Seizures   Has cough. shortness of breath better.  No further seizures  REVIEW OF SYSTEMS:    Review of Systems  Unable to perform ROS: Mental acuity   DRUG ALLERGIES:  No Known Allergies  VITALS:  Blood pressure (!) 141/65, pulse 84, temperature 98 F (36.7 C), temperature source Oral, resp. rate (!) 22, height 5' 9.5" (1.765 m), weight 68 kg (150 lb), SpO2 98 %.  PHYSICAL EXAMINATION:   Physical Exam  GENERAL:  71 y.o.-year-old patient lying in the bed with no acute distress.  EYES: Pupils equal, round, reactive to light and accommodation. No scleral icterus. Extraocular muscles intact.  HEENT: Head atraumatic, normocephalic. Oropharynx and nasopharynx clear.  NECK:  Supple, no jugular venous distention. No thyroid enlargement, no tenderness.  LUNGS: Normal breath sounds bilaterally, no wheezing, rales, rhonchi. No use of accessory muscles of respiration.  CARDIOVASCULAR: S1, S2 normal. No murmurs, rubs, or gallops.  ABDOMEN: Soft, nontender, nondistended. Bowel sounds present. No organomegaly or mass.  EXTREMITIES: No cyanosis, clubbing or edema b/l.    NEUROLOGIC: Cranial nerves II through XII are intact. No focal Motor or sensory deficits b/l.   PSYCHIATRIC: The patient is alert and awake SKIN: No obvious rash, lesion, or ulcer.   LABORATORY PANEL:   CBC  Recent Labs Lab 03/27/16 0424  WBC 7.7  HGB 12.4*  HCT 37.1*  PLT 199   ------------------------------------------------------------------------------------------------------------------ Chemistries   Recent Labs Lab 03/26/16 1120  03/28/16 0546  NA 138  < > 139  K 3.4*  < > 2.9*  CL 97*  < > 106  CO2 28  < > 29  GLUCOSE 91  < > 102*  BUN 12  < > 8  CREATININE 0.70   < > 0.54*  CALCIUM 9.0  < > 8.5*  AST 50*  --   --   ALT 32  --   --   ALKPHOS 51  --   --   BILITOT 0.7  --   --   < > = values in this interval not displayed. ------------------------------------------------------------------------------------------------------------------  Cardiac Enzymes  Recent Labs Lab 03/26/16 1120  TROPONINI <0.03   ------------------------------------------------------------------------------------------------------------------  RADIOLOGY:  No results found.   ASSESSMENT AND PLAN:   * Bibasilar pneumonia with sepsis and acute on chronic respiratory failure IV antibiotics. Influenza negative. Wean oxygen as tolerated. Nebulizers when necessary    * Seizures, New onset Due to withdrawal from meds? MRI brain. EEG. Discussed with Dr. Thad Ranger of neurology. Seizure precautions.  * Hypothyroidism   Continue levothyroxine.  * Interstitial lung disease and on chronic immune suppressant.   Continue meds for now.  * Schizophrenia and hallucinations.   Psychiatry consulted   All the records are reviewed and case discussed with Care Management/Social Workerr. Management plans discussed with the patient, family and they are in agreement.  CODE STATUS: FULL CODE  DVT Prophylaxis: SCDs  TOTAL TIME TAKING CARE OF THIS PATIENT: 35 minutes.   POSSIBLE D/C IN 2-3 DAYS, DEPENDING ON CLINICAL CONDITION.  Milagros Loll R M.D on 03/28/2016 at 1:53 PM  Between 7am to 6pm - Pager - [phone]  After 6pm go to [email or web address] - password [email or web address] [facility]  [facility]  Office  [phone]  CC: Primary care physician; No primary care provider on file.  Note: This dictation was prepared with Dragon dictation along with smaller phrase technology. Any transcriptional errors that result from this process are unintentional.

## 2016-03-28 NOTE — Progress Notes (Addendum)
Notified Dr. Elpidio AnisSudini that patient is unresponsive. Will place order for keppra 1,000mg  iv once. Pt just returned from EEG and MRI. RN found patient to be unresponsive. No response from patinet with sternal rub. Rapid response was called. Per MRI and EEG this is how patient had been for some time. Pt had wet himself, and pupils were unresponsive. Also will place order for continuous pulse ox and ABG.  Will continue to monitor. Dr. Luberta MutterKonidena to come see patient.

## 2016-03-28 NOTE — Progress Notes (Signed)
Pt unresponsive after he  Came back from MRI brain and EEG.according to RN ,pt was completely alert and communication before he went down to radiology.meds reviwed.only addition is ativan 0.25 mg.in addition to Risperdal  And cymbalta. Physical exam;unresponsive but his vitals,BG,ABG,ekg.wnl.hypokalemiia also resolved. Cvs;s1,s2 regular Lungs;clear to  Auscultation A/p;'Encephalopathy:;unclear reason;MRI brain unremarkable,is it post ictal:no seizure episode reported ;started KEppra  One dose by Primary team. conrtinue abx/steroids for pneumonia, Check EEG.,amonia,d/c Depakote as  Per psych Asked RN to insert foley as  He had full episode of incontinence and bed sheets  Soiled with urine when he came back from radiology. Time spent;20 min

## 2016-03-28 NOTE — Consult Note (Signed)
[place] [facility]iatry Consult   Reason for Consult:  Consult for 71 year old man with a history of schizophrenia or schizoaffective disorder brought into the hospital with altered mental status and found to have a pneumonia Referring Physician:  [place] Patient Identification: Mason May MRN:  [id number] Principal Diagnosis: Sepsis [facility]) Diagnosis:   Patient Active Problem List   Diagnosis Date Noted  . 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 Sepsis ([place]) [A41.9] 03/26/2016  . Seizure ([place]) [R56.9] 03/26/2016  . UTI (urinary tract infection) [N39.0] 03/26/2016    Total Time spent with patient: 15 minutes  Subjective:   Mason May is a 71 y.o. male patient admitted with "I guess I just wasn't doing well".  Follow-up for Thursday the fourth. Patient seen this afternoon. It looks like he has had an abrupt change in mental status today. Rapid response was called in the afternoon because of unresponsiveness. When I came to see the patient he was responding but was not able to speak articulately and was only responding with soft moaning. Not moving. I know that a workup is being done. Agree with checking ammonia level although I really doubt that that is going to have anything to do with the current presentation. Meanwhile I will discontinue the Depakote as he is unlikely to be undertaken in his current condition anyway. I will follow-up I'll he is in the hospital.  HPI:  Patient interviewed. Chart reviewed. Labs and vitals reviewed. Patient is a 71 year old man with chronic mental health problems and medical issues who was brought in unresponsive and also with reports of having witnessed seizures. On interview today the patient tells a rambling story about how he was trying to tidy up around his house. It's easy to redirect him and get some specific answers to questions but he is not really able to form a clear story of what brought him into the hospital. He is aware that  he was told that he had had seizures but doesn't have any particular memory of it although he did say that he had been jerking in his limbs recently. Patient claims that he had been compliant with his medicines but he is a little vague as to the specifics. He tells me that he had been trying to taper himself off of his lorazepam but he is not able to tell me realistic doses. As far as current psychiatric symptoms the patient says his mood feels fine. He denies feeling depressed. He says that he feels like he has been sleeping okay. He denies any suicidal or homicidal thoughts. He denies any awareness of having hallucinations. On the other hand he tells me almost as an afterthought that there are people who together around outside the window of his apartment and then when he falls asleep they come in and rape him. This sort of thing has been his typical chronic delusion.  Social history: Patient lives by himself in an apartment. We have some reports in the chart that it sounds like he's been in pretty bad shape. Just from his description of it it sounds like it is a mass and that maybe he has some hoarding tendencies. He admits that he doesn't turn his heat on because he is afraid that it will be too dangerous with his home oxygen. He has a sister who is his closest relative and checks up on him.  Medical history: Notably the patient does not have any past history of seizure disorder at all. He has a history of hypothyroidism.  He uses home oxygen for reasons that aren't entirely clear to me. He has elevated lipids.  Substance abuse history: Patient has long been prescribed benzodiazepines by his outpatient psychiatrist. Multiple providers in hospital settings have made the recommendation that this ultimately be discontinued. Patient has shown a tendency to overuse benzodiazepines to his detriment in the past. Does not drink regularly and denies any other drug abuse.  Past Psychiatric History: Long history of  psychotic disorder. Last hospitalization here was several years ago. It looks like he's had some follow-up visits at [facility] in the interim area patient denies ever having tried to kill himself or been violent in the past. His typical delusions are believing that someone or something is coming into his house somehow and hurting him. He has been maintained on Depakote and antipsychotics. He tells me that he thinks that he is supposed to be taking Invega right now but the most recent note from [facility] indicates risperidone.  Risk to Self: Is patient at risk for suicide?: No Risk to Others:   Prior Inpatient Therapy:   Prior Outpatient Therapy:    Past Medical History:  Past Medical History:  Diagnosis Date  . Cerebral vascular disease   . Dementia   . Schizo affective schizophrenia ([place])   . Seizures ([place])    History reviewed. No pertinent surgical history. Family History:  Family History  Problem Relation Age of Onset  . Kidney cancer Mother   . Lung cancer Father    Family Psychiatric  History: He is not aware of any family history of mental illness Social History:  History  Alcohol Use No     History  Drug Use No    Social History   Social History  . Marital status: Widowed    Spouse name: N/A  . Number of children: N/A  . Years of education: N/A   Social History Main Topics  . Smoking status: Never Smoker  . Smokeless tobacco: Never Used  . Alcohol use No  . Drug use: No  . Sexual activity: No   Other Topics Concern  . None   Social History Narrative  . None   Additional Social History:    Allergies:  No Known Allergies  Labs:  Results for orders placed or performed during the hospital encounter of 03/26/16 (from the past 48 hour(s))  Basic metabolic panel     Status: Abnormal   Collection Time: 03/27/16  4:24 AM  Result Value Ref Range   Sodium 139 135 - 145 mmol/L   Potassium 2.9 (L) 3.5 - 5.1 mmol/L   Chloride 101 101 - 111 mmol/L   CO2 29 22 - 32 mmol/L    Glucose, Bld 96 65 - 99 mg/dL   BUN 7 6 - 20 mg/dL   Creatinine, Ser 0.58 (L) 0.61 - 1.24 mg/dL   Calcium 8.3 (L) 8.9 - 10.3 mg/dL   GFR calc non Af Amer >60 >60 mL/min   GFR calc Af Amer >60 >60 mL/min    Comment: (NOTE) The eGFR has been calculated using the CKD EPI equation. This calculation has not been validated in all clinical situations. eGFR's persistently <60 mL/min signify possible Chronic Kidney Disease.    Anion gap 9 5 - 15  CBC     Status: Abnormal   Collection Time: 03/27/16  4:24 AM  Result Value Ref Range   WBC 7.7 3.8 - 10.6 K/uL   RBC 4.30 (L) 4.40 - 5.90 MIL/uL   Hemoglobin 12.4 (L)  13.0 - 18.0 g/dL   HCT 37.1 (L) 40.0 - 52.0 %   MCV 86.3 80.0 - 100.0 fL   MCH 28.9 26.0 - 34.0 pg   MCHC 33.5 32.0 - 36.0 g/dL   RDW 14.7 (H) 11.5 - 14.5 %   Platelets 199 150 - 440 K/uL  Basic metabolic panel     Status: Abnormal   Collection Time: 03/28/16  5:46 AM  Result Value Ref Range   Sodium 139 135 - 145 mmol/L   Potassium 2.9 (L) 3.5 - 5.1 mmol/L   Chloride 106 101 - 111 mmol/L   CO2 29 22 - 32 mmol/L   Glucose, Bld 102 (H) 65 - 99 mg/dL   BUN 8 6 - 20 mg/dL   Creatinine, Ser 0.54 (L) 0.61 - 1.24 mg/dL   Calcium 8.5 (L) 8.9 - 10.3 mg/dL   GFR calc non Af Amer >60 >60 mL/min   GFR calc Af Amer >60 >60 mL/min    Comment: (NOTE) The eGFR has been calculated using the CKD EPI equation. This calculation has not been validated in all clinical situations. eGFR's persistently <60 mL/min signify possible Chronic Kidney Disease.    Anion gap 4 (L) 5 - 15  Valproic acid level     Status: Abnormal   Collection Time: 03/28/16  5:46 AM  Result Value Ref Range   Valproic Acid Lvl 47 (L) 50.0 - 100.0 ug/mL  Vancomycin, trough     Status: Abnormal   Collection Time: 03/28/16  5:46 AM  Result Value Ref Range   Vancomycin Tr 5 (L) 15 - 20 ug/mL  Glucose, capillary     Status: Abnormal   Collection Time: 03/28/16  3:52 PM  Result Value Ref Range   Glucose-Capillary 121  (H) 65 - 99 mg/dL   Comment 1 Notify RN   Blood gas, arterial     Status: Abnormal   Collection Time: 03/28/16  4:05 PM  Result Value Ref Range   FIO2 0.32    Delivery systems NASAL CANNULA    pH, Arterial 7.40 7.350 - 7.450   pCO2 arterial 52 (H) 32.0 - 48.0 mmHg   pO2, Arterial 81 (L) 83.0 - 108.0 mmHg   Bicarbonate 32.2 (H) 20.0 - 28.0 mmol/L   Acid-Base Excess 6.1 (H) 0.0 - 2.0 mmol/L   O2 Saturation 95.9 %   Patient temperature 37.0    Collection site RIGHT RADIAL    Sample type ARTERIAL DRAW    Allens test (pass/fail) PASS PASS    Current Facility-Administered Medications  Medication Dose Route Frequency Provider Last Rate Last Dose  . 0.9 %  sodium chloride infusion   Intravenous Continuous Vaughan Basta, MD 100 mL/hr at 03/28/16 0732    . alfuzosin (UROXATRAL) 24 hr tablet 10 mg  10 mg Oral Q breakfast Vaughan Basta, MD   10 mg at 03/28/16 1030  . atorvastatin (LIPITOR) tablet 40 mg  40 mg Oral Daily Vaughan Basta, MD   40 mg at 03/27/16 1806  . benzonatate (TESSALON) capsule 100 mg  100 mg Oral TID PRN Vaughan Basta, MD      . darifenacin (ENABLEX) 24 hr tablet 7.5 mg  7.5 mg Oral Daily Vaughan Basta, MD   7.5 mg at 03/28/16 1031  . DULoxetine (CYMBALTA) DR capsule 30 mg  30 mg Oral Daily Vaughan Basta, MD   30 mg at 03/28/16 1033  . heparin injection 5,000 Units  5,000 Units Subcutaneous Q8H Vaughan Basta, MD   5,000 Units  at 03/28/16 1605  . levothyroxine (SYNTHROID, LEVOTHROID) tablet 50 mcg  50 mcg Oral QAC breakfast Vaughan Basta, MD   50 mcg at 03/28/16 1033  . LORazepam (ATIVAN) injection 2 mg  2 mg Intravenous Q6H PRN Vaughan Basta, MD   2 mg at 03/27/16 1134  . LORazepam (ATIVAN) tablet 0.25 mg  0.25 mg Oral BID Gonzella Lex, MD   0.25 mg at 03/28/16 1032  . mometasone-formoterol (DULERA) 200-5 MCG/ACT inhaler 2 puff  2 puff Inhalation BID Vaughan Basta, MD   2 puff at 03/28/16 1030  .  mycophenolate (CELLCEPT) capsule 1,500 mg  1,500 mg Oral BID Vaughan Basta, MD   1,500 mg at 03/28/16 1308  . nystatin (MYCOSTATIN/NYSTOP) topical powder   Topical TID Vaughan Basta, MD      . ondansetron (ZOFRAN) injection 4 mg  4 mg Intravenous Q6H PRN Srikar Sudini, MD      . pantoprazole (PROTONIX) EC tablet 40 mg  40 mg Oral Daily Vaughan Basta, MD   40 mg at 03/28/16 1032  . piperacillin-tazobactam (ZOSYN) IVPB 3.375 g  3.375 g Intravenous Q8H Melissa D Maccia, RPH   3.375 g at 03/28/16 1605  . predniSONE (DELTASONE) tablet 10 mg  10 mg Oral Daily Vaughan Basta, MD   10 mg at 03/28/16 1031  . risperiDONE (RISPERDAL) tablet 2 mg  2 mg Oral BID Vaughan Basta, MD   2 mg at 03/28/16 1033  . sodium chloride flush (NS) 0.9 % injection 3 mL  3 mL Intravenous Q12H Vaughan Basta, MD   3 mL at 03/28/16 1039  . traMADol (ULTRAM) tablet 50 mg  50 mg Oral Q6H PRN Vaughan Basta, MD   50 mg at 03/26/16 1709  . vancomycin (VANCOCIN) 1,250 mg in sodium chloride 0.9 % 250 mL IVPB  1,250 mg Intravenous Q8H Srikar Sudini, MD   1,250 mg at 03/28/16 1034    Musculoskeletal: Strength & Muscle Tone: decreased Gait & Station: unsteady Patient leans: N/A  Psychiatric Specialty Exam: Physical Exam  Nursing note and vitals reviewed. Constitutional: He appears well-developed and well-nourished.  HENT:  Head: Normocephalic and atraumatic.  Eyes: Conjunctivae are normal. Pupils are equal, round, and reactive to light.  Neck: Normal range of motion.  Cardiovascular: Normal heart sounds.   Respiratory: He is in respiratory distress.  GI: Soft.  Musculoskeletal: Normal range of motion.  Neurological: He is alert.  Skin: Skin is warm and dry.  Psychiatric: His affect is blunt. His speech is delayed. He is slowed. Thought content is paranoid and delusional. He expresses impulsivity. He expresses no homicidal and no suicidal ideation. He exhibits abnormal  recent memory and abnormal remote memory.    Review of Systems  Constitutional: Positive for malaise/fatigue.  HENT: Negative.   Eyes: Negative.   Respiratory: Positive for shortness of breath.   Cardiovascular: Negative.   Gastrointestinal: Negative.   Musculoskeletal: Negative.   Skin: Negative.   Neurological: Positive for weakness.  Psychiatric/Behavioral: Negative for depression, hallucinations, memory loss, substance abuse and suicidal ideas. The patient is not nervous/anxious and does not have insomnia.     Blood pressure (!) 103/58, pulse 83, temperature 98 F (36.7 C), temperature source Oral, resp. rate (!) 28, height 5' 9.5" (1.765 m), weight 68 kg (150 lb), SpO2 97 %.Body mass index is 21.83 kg/m.  General Appearance: Disheveled  Eye Contact:  Fair  Speech:  Slow  Volume:  Decreased  Mood:  Euthymic  Affect:  Constricted  Thought Process:  Disorganized  Orientation:  Full (Time, Place, and Person)  Thought Content:  Paranoid Ideation and Tangential  Suicidal Thoughts:  No  Homicidal Thoughts:  No  Memory:  Immediate;   Good Recent;   Fair Remote;   Fair  Judgement:  Impaired  Insight:  Shallow  Psychomotor Activity:  Decreased  Concentration:  Concentration: Poor  Recall:  [employer] of Knowledge:  Fair  Language:  Fair  Akathisia:  No  Handed:  Right  AIMS (if indicated):     Assets:  Housing Social Support  ADL's:  Impaired  Cognition:  Impaired,  Mild  Sleep:        Treatment Plan Summary: Daily contact with patient to assess and evaluate symptoms and progress in treatment, Medication management and Plan Only changes to discontinue Depakote for now because of his current mental status. Ammonia level pending. Other labs and workup pending. Follow-up as needed.  Disposition: Patient does not meet criteria for psychiatric inpatient admission. Supportive therapy provided about ongoing stressors.  Alethia Berthold, MD 03/28/2016 4:59 PM

## 2016-03-28 NOTE — Care Management (Signed)
Patient lives at home alone.  Sister available for support.  Sister provides transportation, and runs errands for him.  Patient open with [facility] at home for RN, PT and OT.  Tim with [facility] notified of admission.  Patient has RW, hospital bed, and continuous home O2.  Sister is unsure of which agency O2 is through. Sister will transport at discharge, RNCM following

## 2016-03-28 NOTE — Discharge Instructions (Signed)
Resume diet and activity as before ° °Continue Oxygen °

## 2016-03-28 NOTE — Consult Note (Signed)
Pharmacy Antibiotic Note  Mason May is a 71 y.o. male admitted on 03/26/2016 with pneumonia and sepsis.  Pharmacy has been consulted for vancomycin/zosyn dosing. Pt is immunocomprosimed on cellcept and prednisone. Therefore no PCT ordered.   Plan: Pt received 1g of vancomycin in the ED. Will give next dose in 6 hours for stacked dosing. Vancomycin 1000mg  IV every 12 hours.  Goal trough 15-20 mcg/mL. Zosyn 3.375g IV q8h (4 hour infusion).  Trough at steady state 1/4 @ 0630  01/04 0546 vancomycin trough 5 mcg/mL. Recalculated Ke 0.135 hr-1. Increase vancomycin to 1.25 gm IV Q8H, predicted trough 15 mcg/mL. Pharmacy will continue to follow and adjust as needed to maintain trough 15 to 20 mcg/mL.   01/04: End date scheduled for 01/04 @ 23:59 based on conversation with MD Sudini  Height: 5' 9.5" (176.5 cm) Weight: 150 lb (68 kg) IBW/kg (Calculated) : 71.85  Temp (24hrs), Avg:98.4 F (36.9 C), Min:97.6 F (36.4 C), Max:98.9 F (37.2 C)   Recent Labs Lab 03/26/16 1120 03/26/16 1430 03/27/16 0424 03/28/16 0546  WBC 13.6*  --  7.7  --   CREATININE 0.70  --  0.58* 0.54*  LATICACIDVEN 3.9* 1.1  --   --   VANCOTROUGH  --   --   --  5*    Estimated Creatinine Clearance: 85 mL/min (by C-G formula based on SCr of 0.54 mg/dL (L)).    No Known Allergies  Antimicrobials this admission: vancomcin 1/2 >>  zosyn 1/2 >>  Dose adjustments this admission:   Microbiology results: 1/2 BCx:  1/2 UCx:   1/2 MRSA PCR: needs to be collected 1/2 chest x-ray 1. Areas of bilateral interstitial and airspace lung opacity that are similar to the prior chest radiographs. Chronic areas of scarring suspected. Acute superimposed pneumonia is possible and should be considered likely if there are consistent clinical symptoms. No evidence pulmonary edema.  Thank you for allowing pharmacy to be a part of this patient's care.  Nathan A. Sturgeonookson, [place] BCPS Clinical Pharmacist 03/28/2016 10:22  AM

## 2016-03-28 NOTE — Consult Note (Signed)
Pharmacy Antibiotic Note  Mason May is a 71 y.o. male admitted on 03/26/2016 with pneumonia and sepsis.  Pharmacy has been consulted for vancomycin/zosyn dosing. Pt is immunocomprosimed on cellcept and prednisone. Therefore no PCT ordered.   Plan: Pt received 1g of vancomycin in the ED. Will give next dose in 6 hours for stacked dosing. Vancomycin 1000mg  IV every 12 hours.  Goal trough 15-20 mcg/mL. Zosyn 3.375g IV q8h (4 hour infusion).  Trough at steady state 1/4 @ 0630  01/04 0546 vancomycin trough 5 mcg/mL. Recalculated Ke 0.135 hr-1. Increase vancomycin to 1.25 gm IV Q8H, predicted trough 15 mcg/mL. Pharmacy will continue to follow and adjust as needed to maintain trough 15 to 20 mcg/mL.   Height: 5' 9.5" (176.5 cm) Weight: 150 lb (68 kg) IBW/kg (Calculated) : 71.85  Temp (24hrs), Avg:98.4 F (36.9 C), Min:97.6 F (36.4 C), Max:98.9 F (37.2 C)   Recent Labs Lab 03/26/16 1120 03/26/16 1430 03/27/16 0424 03/28/16 0546  WBC 13.6*  --  7.7  --   CREATININE 0.70  --  0.58* 0.54*  LATICACIDVEN 3.9* 1.1  --   --   VANCOTROUGH  --   --   --  5*    Estimated Creatinine Clearance: 85 mL/min (by C-G formula based on SCr of 0.54 mg/dL (L)).    No Known Allergies  Antimicrobials this admission: vancomcin 1/2 >>  zosyn 1/2 >>  Dose adjustments this admission:   Microbiology results: 1/2 BCx:  1/2 UCx:   1/2 MRSA PCR: needs to be collected 1/2 chest x-ray 1. Areas of bilateral interstitial and airspace lung opacity that are similar to the prior chest radiographs. Chronic areas of scarring suspected. Acute superimposed pneumonia is possible and should be considered likely if there are consistent clinical symptoms. No evidence pulmonary edema.  Thank you for allowing pharmacy to be a part of this patient's care.  Jaizon Deroos A. Ovalookson, [place]D., BCPS Clinical Pharmacist 03/28/2016 6:58 AM

## 2016-03-28 NOTE — Progress Notes (Signed)
Subjective: Patient sleeping but arousable.  No new complaints.    Objective: Current vital signs: BP (!) 141/65 (BP Location: Right Arm)   Pulse 84   Temp 98 F (36.7 C) (Oral)   Resp (!) 22   Ht 5' 9.5" (1.765 m)   Wt 68 kg (150 lb)   SpO2 98%   BMI 21.83 kg/m  Vital signs in last 24 hours: Temp:  [97.5 F (36.4 C)-98.7 F (37.1 C)] 98 F (36.7 C) (01/04 1256) Pulse Rate:  [76-84] 84 (01/04 1256) Resp:  [18-22] 22 (01/04 1256) BP: (112-141)/(55-65) 141/65 (01/04 1256) SpO2:  [95 %-100 %] 98 % (01/04 1256)  Intake/Output from previous day: 01/03 0701 - 01/04 0700 In: 2366 [P.O.:300; I.V.:2021; IV Piggyback:45] Out: 800 [Urine:800] Intake/Output this shift: Total I/O In: 901 [I.V.:620; IV Piggyback:281] Out: 475 [Urine:475] Nutritional status: Diet regular Room service appropriate? Yes; Fluid consistency: Thin  Neurologic Exam: Mental Status: Lethargic.  Follows commands.  Speech fluent.   Cranial Nerves: II: Discs flat bilaterally; Visual fields grossly normal, pupils equal, round, reactive to light and accommodation III,IV, VI: ptosis not present, extra-ocular motions intact bilaterally V,VII: smile symmetric, facial light touch sensation normal bilaterally VIII: hearing normal bilaterally IX,X: gag reflex present XI: bilateral shoulder shrug XII: midline tongue extension Motor: Moves all extremities.  No focal weakness noted.     Lab Results: Basic Metabolic Panel:  Recent Labs Lab 03/26/16 1120 03/27/16 0424 03/28/16 0546  NA 138 139 139  K 3.4* 2.9* 2.9*  CL 97* 101 106  CO2 28 29 29   GLUCOSE 91 96 102*  BUN 12 7 8   CREATININE 0.70 0.58* 0.54*  CALCIUM 9.0 8.3* 8.5*    Liver Function Tests:  Recent Labs Lab 03/26/16 1120  AST 50*  ALT 32  ALKPHOS 51  BILITOT 0.7  PROT 6.9  ALBUMIN 3.9   No results for input(s): LIPASE, AMYLASE in the last 168 hours. No results for input(s): AMMONIA in the last 168 hours.  CBC:  Recent Labs Lab  03/26/16 1120 03/27/16 0424  WBC 13.6* 7.7  HGB 13.0 12.4*  HCT 38.5* 37.1*  MCV 86.2 86.3  PLT 228 199    Cardiac Enzymes:  Recent Labs Lab 03/26/16 1120  TROPONINI <0.03    Lipid Panel: No results for input(s): CHOL, TRIG, HDL, CHOLHDL, VLDL, LDLCALC in the last 168 hours.  CBG: No results for input(s): GLUCAP in the last 168 hours.  Microbiology: Results for orders placed or performed during the hospital encounter of 03/26/16  Blood Culture (routine x 2)     Status: None (Preliminary result)   Collection Time: 03/26/16 11:20 AM  Result Value Ref Range Status   Specimen Description BLOOD LEFT AC  Final   Special Requests   Final    BOTTLES DRAWN AEROBIC AND ANAEROBIC AER ANA   Culture NO GROWTH 2 DAYS  Final   Report Status PENDING  Incomplete  Urine culture     Status: None   Collection Time: 03/26/16 11:20 AM  Result Value Ref Range Status   Specimen Description URINE, RANDOM  Final   Special Requests NONE  Final   Culture NO GROWTH Performed at [facility]   Final   Report Status 03/27/2016 FINAL  Final  Blood Culture (routine x 2)     Status: None (Preliminary result)   Collection Time: 03/26/16  2:30 PM  Result Value Ref Range Status   Specimen Description BLOOD L AC  Final  Special Requests   Final    BOTTLES DRAWN AEROBIC AND ANAEROBIC AER 6ML ANA 7ML   Culture NO GROWTH 2 DAYS  Final   Report Status PENDING  Incomplete  MRSA PCR Screening     Status: None   Collection Time: 03/26/16  3:34 PM  Result Value Ref Range Status   MRSA by PCR NEGATIVE NEGATIVE Final    Comment:        The GeneXpert MRSA Assay (FDA approved for NASAL specimens only), is one component of a comprehensive MRSA colonization surveillance program. It is not intended to diagnose MRSA infection nor to guide or monitor treatment for MRSA infections.     Coagulation Studies: No results for input(s): LABPROT, INR in the last 72 hours.  Imaging: No  results found.  Medications:  I have reviewed the patient's current medications. Scheduled: . alfuzosin  10 mg Oral Q breakfast  . atorvastatin  40 mg Oral Daily  . darifenacin  7.5 mg Oral Daily  . divalproex  500 mg Oral BID  . DULoxetine  30 mg Oral Daily  . heparin  5,000 Units Subcutaneous Q8H  . levothyroxine  50 mcg Oral QAC breakfast  . LORazepam  0.25 mg Oral BID  . mometasone-formoterol  2 puff Inhalation BID  . mycophenolate  1,500 mg Oral BID  . nystatin   Topical TID  . pantoprazole  40 mg Oral Daily  . piperacillin-tazobactam (ZOSYN)  IV  3.375 g Intravenous Q8H  . predniSONE  10 mg Oral Daily  . risperiDONE  2 mg Oral BID  . sodium chloride flush  3 mL Intravenous Q12H  . vancomycin  1,250 mg Intravenous Q8H    Assessment/Plan: Although patient has history of seizures in his chart, it appears that he actually may not have a history of seizures.  With that being the case, full seizure work up required due to unclear etiology.  Depakote level 47.  Recommendations: 1.  MRI of the brain with and without contrast 2.  EEG 3.  Continue Depakote at current dosage.      LOS: 2 days   Thana FarrLeslie Josiel Gahm, MD Neurology [phone] 03/28/2016  1:51 PM

## 2016-03-28 NOTE — Progress Notes (Signed)
RT to room for rapid response, upon arrival patient unresponsive to sternal rub, 96% on 3LPM [place].  Other vitals stable, ABG drawn per MD order.

## 2016-03-28 NOTE — Progress Notes (Addendum)
Per Dr. Luberta MutterKonidena place order for Ammonia lab to be drawn stat. Also add CBC, BMET, Troponin, and EKG stat. Will place foley per MD order.

## 2016-03-29 MED ORDER — ACETAMINOPHEN 325 MG PO TABS
650.0000 mg | ORAL_TABLET | Freq: Four times a day (QID) | ORAL | Status: DC | PRN
  Administered 2016-03-29 – 2016-04-01 (×4): 650 mg via ORAL
  Filled 2016-03-29 (×4): qty 2

## 2016-03-29 MED ORDER — DIVALPROEX SODIUM 500 MG PO DR TAB
500.0000 mg | DELAYED_RELEASE_TABLET | Freq: Two times a day (BID) | ORAL | Status: DC
  Administered 2016-03-29 – 2016-04-01 (×6): 500 mg via ORAL
  Filled 2016-03-29 (×6): qty 1

## 2016-03-29 NOTE — Progress Notes (Signed)
[facility] - [place] at [facility]   PATIENT NAME: Mason May    MR#:  [id number]  DATE OF BIRTH:  16-Jan-1946  SUBJECTIVE:  CHIEF COMPLAINT:   Chief Complaint  Patient presents with  . Altered Mental Status  . Seizures   Has cough. shortness of breath better.  No further seizures  Patient had one episode of being unresponsive in the evening of 03/28/2016. Woke up on his own. Did receive 1 dose of IV Keppra for possible seizures.  Depakote stop by psychiatry.  REVIEW OF SYSTEMS:    Review of Systems  Unable to perform ROS: Mental acuity   DRUG ALLERGIES:  No Known Allergies  VITALS:  Blood pressure (!) 153/82, pulse (!) 112, temperature 98.4 F (36.9 C), temperature source Oral, resp. rate 16, height 5' 9.5" (1.765 m), weight 68 kg (150 lb), SpO2 97 %.  PHYSICAL EXAMINATION:   Physical Exam  GENERAL:  71 y.o.-year-old patient lying in the bed with no acute distress.  EYES: Pupils equal, round, reactive to light and accommodation. No scleral icterus. Extraocular muscles intact.  HEENT: Head atraumatic, normocephalic. Oropharynx and nasopharynx clear.  NECK:  Supple, no jugular venous distention. No thyroid enlargement, no tenderness.  LUNGS: Normal breath sounds bilaterally, no wheezing, rales, rhonchi. No use of accessory muscles of respiration.  CARDIOVASCULAR: S1, S2 normal. No murmurs, rubs, or gallops.  ABDOMEN: Soft, nontender, nondistended. Bowel sounds present. No organomegaly or mass.  EXTREMITIES: No cyanosis, clubbing or edema b/l.    NEUROLOGIC: Cranial nerves II through XII are intact. No focal Motor or sensory deficits b/l.   PSYCHIATRIC: The patient is alert and awake SKIN: No obvious rash, lesion, or ulcer.   LABORATORY PANEL:   CBC  Recent Labs Lab 03/28/16 1649  WBC 4.7  HGB 12.7*  HCT 38.5*  PLT 185    ------------------------------------------------------------------------------------------------------------------ Chemistries   Recent Labs Lab 03/26/16 1120  03/28/16 1649  NA 138  < > 142  K 3.4*  < > 3.4*  CL 97*  < > 105  CO2 28  < > 32  GLUCOSE 91  < > 145*  BUN 12  < > 6  CREATININE 0.70  < > 0.69  CALCIUM 9.0  < > 8.9  AST 50*  --   --   ALT 32  --   --   ALKPHOS 51  --   --   BILITOT 0.7  --   --   < > = values in this interval not displayed. ------------------------------------------------------------------------------------------------------------------  Cardiac Enzymes  Recent Labs Lab 03/28/16 1649  TROPONINI <0.03   ------------------------------------------------------------------------------------------------------------------  RADIOLOGY:  Mr Laqueta JeanBrain W Wo Contrast  Result Date: 03/28/2016 CLINICAL DATA:  Initial evaluation for acute seizure. History of CVA, dementia, seizure, and schizophrenia. EXAM: MRI HEAD WITHOUT AND WITH CONTRAST TECHNIQUE: Multiplanar, multiecho pulse sequences of the brain and surrounding structures were obtained without and with intravenous contrast. CONTRAST:  20mL MULTIHANCE GADOBENATE DIMEGLUMINE 529 MG/ML IV SOLN COMPARISON:  Prior CT from 03/26/2016. FINDINGS: Brain: Study is mildly degraded by motion artifact. Diffuse prominence of the CSF containing spaces is compatible with moderate generalized cerebral atrophy. No significant cerebral Devoss matter disease for patient age. No abnormal foci of restricted diffusion to suggest acute or subacute ischemia. Gray-Brigante matter differentiation maintained. No evidence for acute or chronic intracranial hemorrhage. No definite areas of chronic infarction identified. No mass lesion, midline shift or mass effect. Ventricles are normal in size without hydrocephalus. No extra-axial fluid collection. Major  dural sinuses are patent. No abnormal enhancement. No intrinsic temporal lobe abnormality.  Incidental note made of a partially empty sella. Vascular: Major intracranial vascular flow voids are maintained. Skull and upper cervical spine: Craniocervical junction within normal limits. Mild multilevel degenerative spondylolysis noted within the upper cervical spine without significant stenosis. Bone marrow signal intensity within normal limits. No scalp soft tissue abnormality. Sinuses/Orbits: Globes and orbits grossly unremarkable. Mild scattered mucosal thickening within the ethmoidal air cells. Paranasal sinuses are otherwise clear. No mastoid effusion. Inner ear structures grossly normal. IMPRESSION: 1. No acute or reversible intracranial process identified. 2. Moderate generalized cerebral atrophy. Electronically Signed   By: Rise Mu M.D.   On: 03/28/2016 15:17     ASSESSMENT AND PLAN:   * Bibasilar pneumonia with sepsis and acute on chronic respiratory failure IV antibiotics. Influenza negative. Wean oxygen as tolerated. Nebulizers when necessary    * Seizures, New onset Due to withdrawal from meds? MRI brain. EEG. Unremarkable Seizure precautions. No further Keppra. Depakote stop. Monitor for seizures.  * Hypothyroidism   Continue levothyroxine.  * Interstitial lung disease and on chronic immune suppressant.   Continue meds for now.  * Schizophrenia and hallucinations.   Psychiatry consulted. Appreciate input   All the records are reviewed and case discussed with Care Management/Social Workerr. Management plans discussed with the patient, family and they are in agreement.  CODE STATUS: FULL CODE  DVT Prophylaxis: SCDs  TOTAL TIME TAKING CARE OF THIS PATIENT: 35 minutes.   POSSIBLE D/C IN 1-2 DAYS, DEPENDING ON CLINICAL CONDITION.  Milagros Loll R M.D on 03/29/2016 at 2:33 PM  Between 7am to 6pm - Pager - [phone]  After 6pm go to [email or web address] - password [email or web address] [facility]  [facility] [place] [facility]  Office  [phone]  CC: Primary care  physician; Lance Bosch, MD  Note: This dictation was prepared with Dragon dictation along with smaller phrase technology. Any transcriptional errors that result from this process are unintentional.

## 2016-03-29 NOTE — Consult Note (Signed)
[place] Psychiatry Consult   Reason for Consult:  Consult for 71 year old man with a history of schizophrenia or schizoaffective disorder brought into the hospital with altered mental status and found to have a pneumonia Referring Physician:  Pink Patient Identification: Mason May MRN:  [id number] Principal Diagnosis: Sepsis [facility]) Diagnosis:   Patient Active Problem List   Diagnosis Date Noted  . Altered mental status [R41.82]   . 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 Sepsis ([place]) [A41.9] 03/26/2016  . Seizure ([place]) [R56.9] 03/26/2016  . UTI (urinary tract infection) [N39.0] 03/26/2016    Total Time spent with patient: 15 minutes  Subjective:   Mason May is a 71 y.o. male patient admitted with "I guess I just wasn't doing well". Follow-up for Friday the fifth. Patient seen this afternoon chart reviewed. Patient was completely different today. Wide-awake. Eyes open. Fully vulnerable. Interacting appropriately. He says he is aware that there was something about him yesterday and it was scaring everyone. He finds the whole thing puzzling. Says he is feeling quite okay today. Mood is feeling stable. Not expressing any delusions or psychotic symptoms. In fact, he was able to very rationally discuss what I think is an acute concern about the safety of his discharge plan given how cold it is outside and how little resources he has.  Follow-up for Thursday the fourth. Patient seen this afternoon. It looks like he has had an abrupt change in mental status today. Rapid response was called in the afternoon because of unresponsiveness. When I came to see the patient he was responding but was not able to speak articulately and was only responding with soft moaning. Not moving. I know that a workup is being done. Agree with checking ammonia level although I really doubt that that is going to have anything to do with the current presentation. Meanwhile I will discontinue  the Depakote as he is unlikely to be undertaken in his current condition anyway. I will follow-up I'll he is in the hospital.  HPI:  Patient interviewed. Chart reviewed. Labs and vitals reviewed. Patient is a 71 year old man with chronic mental health problems and medical issues who was brought in unresponsive and also with reports of having witnessed seizures. On interview today the patient tells a rambling story about how he was trying to tidy up around his house. It's easy to redirect him and get some specific answers to questions but he is not really able to form a clear story of what brought him into the hospital. He is aware that he was told that he had had seizures but doesn't have any particular memory of it although he did say that he had been jerking in his limbs recently. Patient claims that he had been compliant with his medicines but he is a little vague as to the specifics. He tells me that he had been trying to taper himself off of his lorazepam but he is not able to tell me realistic doses. As far as current psychiatric symptoms the patient says his mood feels fine. He denies feeling depressed. He says that he feels like he has been sleeping okay. He denies any suicidal or homicidal thoughts. He denies any awareness of having hallucinations. On the other hand he tells me almost as an afterthought that there are people who together around outside the window of his apartment and then when he falls asleep they come in and rape him. This sort of thing has been his typical chronic delusion.  Social  history: Patient lives by himself in an apartment. We have some reports in the chart that it sounds like he's been in pretty bad shape. Just from his description of it it sounds like it is a mass and that maybe he has some hoarding tendencies. He admits that he doesn't turn his heat on because he is afraid that it will be too dangerous with his home oxygen. He has a sister who is his closest relative and  checks up on him.  Medical history: Notably the patient does not have any past history of seizure disorder at all. He has a history of hypothyroidism. He uses home oxygen for reasons that aren't entirely clear to me. He has elevated lipids.  Substance abuse history: Patient has long been prescribed benzodiazepines by his outpatient psychiatrist. Multiple providers in hospital settings have made the recommendation that this ultimately be discontinued. Patient has shown a tendency to overuse benzodiazepines to his detriment in the past. Does not drink regularly and denies any other drug abuse.  Past Psychiatric History: Long history of psychotic disorder. Last hospitalization here was several years ago. It looks like he's had some follow-up visits at [facility] in the interim area patient denies ever having tried to kill himself or been violent in the past. His typical delusions are believing that someone or something is coming into his house somehow and hurting him. He has been maintained on Depakote and antipsychotics. He tells me that he thinks that he is supposed to be taking Invega right now but the most recent note from [facility] indicates risperidone.  Risk to Self: Is patient at risk for suicide?: No Risk to Others:   Prior Inpatient Therapy:   Prior Outpatient Therapy:    Past Medical History:  Past Medical History:  Diagnosis Date  . Cerebral vascular disease   . Dementia   . 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 Seizures ([place])    History reviewed. No pertinent surgical history. Family History:  Family History  Problem Relation Age of Onset  . Kidney cancer Mother   . Lung cancer Father    Family Psychiatric  History: He is not aware of any family history of mental illness Social History:  History  Alcohol Use No     History  Drug Use No    Social History   Social History  . Marital status: Widowed    Spouse name: N/A  . Number of children: N/A  . Years of education: N/A    Social History Main Topics  . Smoking status: Never Smoker  . Smokeless tobacco: Never Used  . Alcohol use No  . Drug use: No  . Sexual activity: No   Other Topics Concern  . None   Social History Narrative  . None   Additional Social History:    Allergies:  No Known Allergies  Labs:  Results for orders placed or performed during the hospital encounter of 03/26/16 (from the past 48 hour(s))  Basic metabolic panel     Status: Abnormal   Collection Time: 03/28/16  5:46 AM  Result Value Ref Range   Sodium 139 135 - 145 mmol/L   Potassium 2.9 (L) 3.5 - 5.1 mmol/L   Chloride 106 101 - 111 mmol/L   CO2 29 22 - 32 mmol/L   Glucose, Bld 102 (H) 65 - 99 mg/dL   BUN 8 6 - 20 mg/dL   Creatinine, Ser 0.54 (L) 0.61 - 1.24 mg/dL   Calcium 8.5 (L) 8.9 -  10.3 mg/dL   GFR calc non Af Amer >60 >60 mL/min   GFR calc Af Amer >60 >60 mL/min    Comment: (NOTE) The eGFR has been calculated using the CKD EPI equation. This calculation has not been validated in all clinical situations. eGFR's persistently <60 mL/min signify possible Chronic Kidney Disease.    Anion gap 4 (L) 5 - 15  Valproic acid level     Status: Abnormal   Collection Time: 03/28/16  5:46 AM  Result Value Ref Range   Valproic Acid Lvl 47 (L) 50.0 - 100.0 ug/mL  Vancomycin, trough     Status: Abnormal   Collection Time: 03/28/16  5:46 AM  Result Value Ref Range   Vancomycin Tr 5 (L) 15 - 20 ug/mL  Glucose, capillary     Status: Abnormal   Collection Time: 03/28/16  3:52 PM  Result Value Ref Range   Glucose-Capillary 121 (H) 65 - 99 mg/dL   Comment 1 Notify RN   Blood gas, arterial     Status: Abnormal   Collection Time: 03/28/16  4:05 PM  Result Value Ref Range   FIO2 0.32    Delivery systems NASAL CANNULA    pH, Arterial 7.40 7.350 - 7.450   pCO2 arterial 52 (H) 32.0 - 48.0 mmHg   pO2, Arterial 81 (L) 83.0 - 108.0 mmHg   Bicarbonate 32.2 (H) 20.0 - 28.0 mmol/L   Acid-Base Excess 6.1 (H) 0.0 - 2.0 mmol/L    O2 Saturation 95.9 %   Patient temperature 37.0    Collection site RIGHT RADIAL    Sample type ARTERIAL DRAW    Allens test (pass/fail) PASS PASS  Ammonia     Status: None   Collection Time: 03/28/16  4:49 PM  Result Value Ref Range   Ammonia 28 9 - 35 umol/L  Troponin I     Status: None   Collection Time: 03/28/16  4:49 PM  Result Value Ref Range   Troponin I <0.03 <0.03 ng/mL  Basic metabolic panel     Status: Abnormal   Collection Time: 03/28/16  4:49 PM  Result Value Ref Range   Sodium 142 135 - 145 mmol/L   Potassium 3.4 (L) 3.5 - 5.1 mmol/L   Chloride 105 101 - 111 mmol/L   CO2 32 22 - 32 mmol/L   Glucose, Bld 145 (H) 65 - 99 mg/dL   BUN 6 6 - 20 mg/dL   Creatinine, Ser 0.69 0.61 - 1.24 mg/dL   Calcium 8.9 8.9 - 10.3 mg/dL   GFR calc non Af Amer >60 >60 mL/min   GFR calc Af Amer >60 >60 mL/min    Comment: (NOTE) The eGFR has been calculated using the CKD EPI equation. This calculation has not been validated in all clinical situations. eGFR's persistently <60 mL/min signify possible Chronic Kidney Disease.    Anion gap 5 5 - 15  CBC     Status: Abnormal   Collection Time: 03/28/16  4:49 PM  Result Value Ref Range   WBC 4.7 3.8 - 10.6 K/uL   RBC 4.38 (L) 4.40 - 5.90 MIL/uL   Hemoglobin 12.7 (L) 13.0 - 18.0 g/dL   HCT 38.5 (L) 40.0 - 52.0 %   MCV 87.8 80.0 - 100.0 fL   MCH 29.0 26.0 - 34.0 pg   MCHC 33.0 32.0 - 36.0 g/dL   RDW 14.7 (H) 11.5 - 14.5 %   Platelets 185 150 - 440 K/uL    Current Facility-Administered Medications  Medication Dose Route Frequency Provider Last Rate Last Dose  . acetaminophen (TYLENOL) tablet 650 mg  650 mg Oral Q6H PRN Hillary Bow, MD   650 mg at 03/29/16 1351  . alfuzosin (UROXATRAL) 24 hr tablet 10 mg  10 mg Oral Q breakfast Vaughan Basta, MD   10 mg at 03/29/16 0743  . atorvastatin (LIPITOR) tablet 40 mg  40 mg Oral Daily Vaughan Basta, MD   40 mg at 03/27/16 1806  . benzonatate (TESSALON) capsule 100 mg  100 mg  Oral TID PRN Vaughan Basta, MD      . darifenacin (ENABLEX) 24 hr tablet 7.5 mg  7.5 mg Oral Daily Vaughan Basta, MD   7.5 mg at 03/29/16 0952  . divalproex (DEPAKOTE) DR tablet 500 mg  500 mg Oral Q12H Gonzella Lex, MD      . DULoxetine (CYMBALTA) DR capsule 30 mg  30 mg Oral Daily Vaughan Basta, MD   30 mg at 03/29/16 0953  . heparin injection 5,000 Units  5,000 Units Subcutaneous Q8H Vaughan Basta, MD   5,000 Units at 03/29/16 1351  . levothyroxine (SYNTHROID, LEVOTHROID) tablet 50 mcg  50 mcg Oral QAC breakfast Vaughan Basta, MD   50 mcg at 03/29/16 0743  . LORazepam (ATIVAN) injection 2 mg  2 mg Intravenous Q6H PRN Vaughan Basta, MD   2 mg at 03/27/16 1134  . LORazepam (ATIVAN) tablet 0.25 mg  0.25 mg Oral BID Gonzella Lex, MD   0.25 mg at 03/29/16 0953  . mometasone-formoterol (DULERA) 200-5 MCG/ACT inhaler 2 puff  2 puff Inhalation BID Vaughan Basta, MD   2 puff at 03/29/16 0743  . mycophenolate (CELLCEPT) capsule 1,500 mg  1,500 mg Oral BID Vaughan Basta, MD   1,500 mg at 03/29/16 0952  . nystatin (MYCOSTATIN/NYSTOP) topical powder   Topical TID Vaughan Basta, MD      . ondansetron (ZOFRAN) injection 4 mg  4 mg Intravenous Q6H PRN Srikar Sudini, MD      . pantoprazole (PROTONIX) EC tablet 40 mg  40 mg Oral Daily Vaughan Basta, MD   40 mg at 03/29/16 0953  . piperacillin-tazobactam (ZOSYN) IVPB 3.375 g  3.375 g Intravenous Q8H Melissa D Maccia, RPH   3.375 g at 03/29/16 1513  . predniSONE (DELTASONE) tablet 10 mg  10 mg Oral Daily Vaughan Basta, MD   10 mg at 03/29/16 0953  . risperiDONE (RISPERDAL) tablet 2 mg  2 mg Oral BID Vaughan Basta, MD   2 mg at 03/29/16 0952  . sodium chloride flush (NS) 0.9 % injection 3 mL  3 mL Intravenous Q12H Vaughan Basta, MD   Stopped at 03/29/16 1000  . traMADol (ULTRAM) tablet 50 mg  50 mg Oral Q6H PRN Vaughan Basta, MD   50 mg at 03/29/16 1513     Musculoskeletal: Strength & Muscle Tone: decreased Gait & Station: unsteady Patient leans: N/A  Psychiatric Specialty Exam: Physical Exam  Nursing note and vitals reviewed. Constitutional: He appears well-developed and well-nourished.  HENT:  Head: Normocephalic and atraumatic.  Eyes: Conjunctivae are normal. Pupils are equal, round, and reactive to light.  Neck: Normal range of motion.  Cardiovascular: Normal heart sounds.   Respiratory: He is in respiratory distress.  GI: Soft.  Musculoskeletal: Normal range of motion.  Neurological: He is alert.  Skin: Skin is warm and dry.  Psychiatric: He has a normal mood and affect. His affect is not blunt. His speech is delayed. He is slowed. Thought content is not  paranoid and not delusional. He expresses impulsivity. He expresses no homicidal and no suicidal ideation. He exhibits abnormal remote memory. He exhibits normal recent memory.    Review of Systems  Constitutional: Positive for malaise/fatigue.  HENT: Negative.   Eyes: Negative.   Respiratory: Positive for shortness of breath.   Cardiovascular: Negative.   Gastrointestinal: Negative.   Musculoskeletal: Negative.   Skin: Negative.   Neurological: Positive for weakness.  Psychiatric/Behavioral: Negative for depression, hallucinations, memory loss, substance abuse and suicidal ideas. The patient is not nervous/anxious and does not have insomnia.     Blood pressure (!) 153/82, pulse (!) 112, temperature 98.4 F (36.9 C), temperature source Oral, resp. rate 16, height 5' 9.5" (1.765 m), weight 68 kg (150 lb), SpO2 97 %.Body mass index is 21.83 kg/m.  General Appearance: Disheveled  Eye Contact:  Fair  Speech:  Slow  Volume:  Decreased  Mood:  Euthymic  Affect:  Constricted  Thought Process:  Disorganized  Orientation:  Full (Time, Place, and Person)  Thought Content:  Paranoid Ideation and Tangential  Suicidal Thoughts:  No  Homicidal Thoughts:  No  Memory:   Immediate;   Good Recent;   Fair Remote;   Fair  Judgement:  Impaired  Insight:  Shallow  Psychomotor Activity:  Decreased  Concentration:  Concentration: Poor  Recall:  [employer] of Knowledge:  Fair  Language:  Fair  Akathisia:  No  Handed:  Right  AIMS (if indicated):     Assets:  Housing Social Support  ADL's:  Impaired  Cognition:  Impaired,  Mild  Sleep:        Treatment Plan Summary: Daily contact with patient to assess and evaluate symptoms and progress in treatment, Medication management and Plan No ammonia level was checked but I never thought that was likely to be relevant. I don't think it this point the Depakote was probably to blame for his presentation yesterday. Dr. Doy Mince note was reviewed and I agree with restarting the Depakote 500 mg twice a day. Patient really needs to make sure that he has a safe environment to go to before discharge. No other change to psychiatric medicine or treatment at this point.  Disposition: Patient does not meet criteria for psychiatric inpatient admission. Supportive therapy provided about ongoing stressors.  Alethia Berthold, MD 03/29/2016 4:02 PM

## 2016-03-29 NOTE — Consult Note (Signed)
Pharmacy Antibiotic Note  Mason May is a 71 y.o. male admitted on 03/26/2016 with pneumonia and sepsis.  Pharmacy has been consulted for zosyn dosing. Pt is immunocomprosimed on cellcept and prednisone.  Plan: Continue Zosyn 3.375 g IV q8 hours.    Height: 5' 9.5" (176.5 cm) Weight: 150 lb (68 kg) IBW/kg (Calculated) : 71.85  Temp (24hrs), Avg:98.1 F (36.7 C), Min:98 F (36.7 C), Max:98.2 F (36.8 C)   Recent Labs Lab 03/26/16 1120 03/26/16 1430 03/27/16 0424 03/28/16 0546 03/28/16 1649  WBC 13.6*  --  7.7  --  4.7  CREATININE 0.70  --  0.58* 0.54* 0.69  LATICACIDVEN 3.9* 1.1  --   --   --   VANCOTROUGH  --   --   --  5*  --     Estimated Creatinine Clearance: 85 mL/min (by C-G formula based on SCr of 0.69 mg/dL).    No Known Allergies  Antimicrobials this admission: vancomcin 1/2 >>  zosyn 1/2 >>  Dose adjustments this admission:   Microbiology results: 1/2 BCx:  1/2 UCx:   1/2 MRSA PCR: needs to be collected 1/2 chest x-ray 1. Areas of bilateral interstitial and airspace lung opacity that are similar to the prior chest radiographs. Chronic areas of scarring suspected. Acute superimposed pneumonia is possible and should be considered likely if there are consistent clinical symptoms. No evidence pulmonary edema.  Thank you for allowing pharmacy to be a part of this patient's care.  Demetrius Charityeldrin D. Kiyra Slaubaugh, PharmD  Clinical Pharmacist 03/29/2016 11:37 AM

## 2016-03-29 NOTE — Care Management Important Message (Signed)
Important Message  Patient Details  Name: Mason May MRN: [id number] Date of Birth: 1945-09-19   Medicare Important Message Given:  Yes    Chapman FitchBOWEN, Ohn Bostic T, RN 03/29/2016, 12:51 PM

## 2016-03-29 NOTE — Progress Notes (Signed)
Subjective: Patient poorly responsive yesterday but and remains lethargic this morning.  There was some concern yesterday for seizure activity since the patient was poorly responsive and Keppra was started.  Depakote was discontinued.    Objective: Current vital signs: BP (!) 115/54 (BP Location: Right Arm)   Pulse 98   Temp 98 F (36.7 C) (Oral)   Resp 16   Ht 5' 9.5" (1.765 m)   Wt 68 kg (150 lb)   SpO2 96%   BMI 21.83 kg/m  Vital signs in last 24 hours: Temp:  [98 F (36.7 C)-98.2 F (36.8 C)] 98 F (36.7 C) (01/05 0455) Pulse Rate:  [75-98] 98 (01/05 0455) Resp:  [16-28] 16 (01/05 0455) BP: (103-147)/(54-72) 115/54 (01/05 0455) SpO2:  [94 %-100 %] 96 % (01/05 0455)  Intake/Output from previous day: 01/04 0701 - 01/05 0700 In: 2746 [P.O.:480; I.V.:1912; IV Piggyback:354] Out: 2100 [Urine:2100] Intake/Output this shift: Total I/O In: 201 [I.V.:201] Out: 300 [Urine:300] Nutritional status: Diet regular Room service appropriate? Yes; Fluid consistency: Thin  Neurologic Exam: Mental Status: Lethargic.  Follows commands.  Speech fluent.   Cranial Nerves: II: Discs flat bilaterally; Visual fields grossly normal, pupils equal, round, reactive to light and accommodation III,IV, VI: ptosis not present, extra-ocular motions intact bilaterally V,VII: smile symmetric, facial light touch sensation normal bilaterally VIII: hearing normal bilaterally IX,X: gag reflex present XI: bilateral shoulder shrug XII: midline tongue extension Motor: Moves all extremities.  No focal weakness noted.     Lab Results: Basic Metabolic Panel:  Recent Labs Lab 03/26/16 1120 03/27/16 0424 03/28/16 0546 03/28/16 1649  NA 138 139 139 142  K 3.4* 2.9* 2.9* 3.4*  CL 97* 101 106 105  CO2 28 29 29  32  GLUCOSE 91 96 102* 145*  BUN 12 7 8 6   CREATININE 0.70 0.58* 0.54* 0.69  CALCIUM 9.0 8.3* 8.5* 8.9    Liver Function Tests:  Recent Labs Lab 03/26/16 1120  AST 50*  ALT 32   ALKPHOS 51  BILITOT 0.7  PROT 6.9  ALBUMIN 3.9   No results for input(s): LIPASE, AMYLASE in the last 168 hours.  Recent Labs Lab 03/28/16 1649  AMMONIA 28    CBC:  Recent Labs Lab 03/26/16 1120 03/27/16 0424 03/28/16 1649  WBC 13.6* 7.7 4.7  HGB 13.0 12.4* 12.7*  HCT 38.5* 37.1* 38.5*  MCV 86.2 86.3 87.8  PLT 228 199 185    Cardiac Enzymes:  Recent Labs Lab 03/26/16 1120 03/28/16 1649  TROPONINI <0.03 <0.03    Lipid Panel: No results for input(s): CHOL, TRIG, HDL, CHOLHDL, VLDL, LDLCALC in the last 168 hours.  CBG:  Recent Labs Lab 03/28/16 1552  GLUCAP 121*    Microbiology: Results for orders placed or performed during the hospital encounter of 03/26/16  Blood Culture (routine x 2)     Status: None (Preliminary result)   Collection Time: 03/26/16 11:20 AM  Result Value Ref Range Status   Specimen Description BLOOD LEFT AC  Final   Special Requests   Final    BOTTLES DRAWN AEROBIC AND ANAEROBIC AER ANA   Culture NO GROWTH 3 DAYS  Final   Report Status PENDING  Incomplete  Urine culture     Status: None   Collection Time: 03/26/16 11:20 AM  Result Value Ref Range Status   Specimen Description URINE, RANDOM  Final   Special Requests NONE  Final   Culture NO GROWTH Performed at [facility]   Final  Report Status 03/27/2016 FINAL  Final  Blood Culture (routine x 2)     Status: None (Preliminary result)   Collection Time: 03/26/16  2:30 PM  Result Value Ref Range Status   Specimen Description BLOOD L AC  Final   Special Requests   Final    BOTTLES DRAWN AEROBIC AND ANAEROBIC AER ANA   Culture NO GROWTH 3 DAYS  Final   Report Status PENDING  Incomplete  MRSA PCR Screening     Status: None   Collection Time: 03/26/16  3:34 PM  Result Value Ref Range Status   MRSA by PCR NEGATIVE NEGATIVE Final    Comment:        The GeneXpert MRSA Assay (FDA approved for NASAL specimens only), is one component of a comprehensive  MRSA colonization surveillance program. It is not intended to diagnose MRSA infection nor to guide or monitor treatment for MRSA infections.     Coagulation Studies: No results for input(s): LABPROT, INR in the last 72 hours.  Imaging: Mr Laqueta Jean ZO Contrast  Result Date: 03/28/2016 CLINICAL DATA:  Initial evaluation for acute seizure. History of CVA, dementia, seizure, and schizophrenia. EXAM: MRI HEAD WITHOUT AND WITH CONTRAST TECHNIQUE: Multiplanar, multiecho pulse sequences of the brain and surrounding structures were obtained without and with intravenous contrast. CONTRAST:  20mL MULTIHANCE GADOBENATE DIMEGLUMINE 529 MG/ML IV SOLN COMPARISON:  Prior CT from 03/26/2016. FINDINGS: Brain: Study is mildly degraded by motion artifact. Diffuse prominence of the CSF containing spaces is compatible with moderate generalized cerebral atrophy. No significant cerebral Zuniga matter disease for patient age. No abnormal foci of restricted diffusion to suggest acute or subacute ischemia. Gray-Mollett matter differentiation maintained. No evidence for acute or chronic intracranial hemorrhage. No definite areas of chronic infarction identified. No mass lesion, midline shift or mass effect. Ventricles are normal in size without hydrocephalus. No extra-axial fluid collection. Major dural sinuses are patent. No abnormal enhancement. No intrinsic temporal lobe abnormality. Incidental note made of a partially empty sella. Vascular: Major intracranial vascular flow voids are maintained. Skull and upper cervical spine: Craniocervical junction within normal limits. Mild multilevel degenerative spondylolysis noted within the upper cervical spine without significant stenosis. Bone marrow signal intensity within normal limits. No scalp soft tissue abnormality. Sinuses/Orbits: Globes and orbits grossly unremarkable. Mild scattered mucosal thickening within the ethmoidal air cells. Paranasal sinuses are otherwise clear. No  mastoid effusion. Inner ear structures grossly normal. IMPRESSION: 1. No acute or reversible intracranial process identified. 2. Moderate generalized cerebral atrophy. Electronically Signed   By: Rise Mu M.D.   On: 03/28/2016 15:17    Medications:  I have reviewed the patient's current medications. Scheduled: . alfuzosin  10 mg Oral Q breakfast  . atorvastatin  40 mg Oral Daily  . darifenacin  7.5 mg Oral Daily  . DULoxetine  30 mg Oral Daily  . heparin  5,000 Units Subcutaneous Q8H  . levothyroxine  50 mcg Oral QAC breakfast  . LORazepam  0.25 mg Oral BID  . mometasone-formoterol  2 puff Inhalation BID  . mycophenolate  1,500 mg Oral BID  . nystatin   Topical TID  . pantoprazole  40 mg Oral Daily  . piperacillin-tazobactam (ZOSYN)  IV  3.375 g Intravenous Q8H  . predniSONE  10 mg Oral Daily  . risperiDONE  2 mg Oral BID  . sodium chloride flush  3 mL Intravenous Q12H    Assessment/Plan: Patient was actually lethargic most of the day yesterday.  Unclear that  was actually seizure since patient had EEG yesterday as well that was only significant for low voltage sleep.  Patient with unclear compliance with medications at home.  Compliance here may be causing some sedation.    Recommendations: 1.  Maintenance Keppra not initiated.  Would not initiate at this time but would continue to follow clinically.         LOS: 3 days   Thana FarrLeslie Theia Dezeeuw, MD Neurology [phone] 03/29/2016  11:13 AM

## 2016-03-30 DIAGNOSIS — R569 Unspecified convulsions: Secondary | ICD-10-CM

## 2016-03-30 NOTE — Progress Notes (Signed)
Called out to desk complaining that he was having a seizure coming on, states his legs and jerking and he remained alert and oriented and responsive, no seizure activity noted by staff. Pt admits to restless leg syndrome and requested some tylenol for the discomfort .

## 2016-03-30 NOTE — Progress Notes (Signed)
[employer] [facility] [place] [facility]   PATIENT NAME: Mason May    MR#:  [id number]  DATE OF BIRTH:  1945-07-22  SUBJECTIVE:  CHIEF COMPLAINT:   Chief Complaint  Patient presents with  . Altered Mental Status  . Seizures   cough. shortness of breath are  better.  No further seizures  Patient had one episode of being unresponsive in the evening of 03/28/2016. Woke up on his own. Did receive 1 dose of IV Keppra for possible seizures.  Depakote stopped by psychiatry.  REVIEW OF SYSTEMS:    Review of Systems  Unable to perform ROS: Mental acuity   DRUG ALLERGIES:  No Known Allergies  VITALS:  Blood pressure (!) 145/68, pulse (!) 108, temperature 98.1 F (36.7 C), temperature source Oral, resp. rate 16, height 5' 9.5" (1.765 m), weight 68 kg (150 lb), SpO2 98 %.  PHYSICAL EXAMINATION:   Physical Exam  GENERAL:  71 y.o.-year-old patient lying in the bed with no acute distress.  EYES: Pupils equal, round, reactive to light and accommodation. No scleral icterus. Extraocular muscles intact.  HEENT: Head atraumatic, normocephalic. Oropharynx and nasopharynx clear.  NECK:  Supple, no jugular venous distention. No thyroid enlargement, no tenderness.  LUNGS: Normal breath sounds bilaterally, no wheezing, rales, rhonchi. No use of accessory muscles of respiration.  CARDIOVASCULAR: S1, S2 normal. No murmurs, rubs, or gallops.  ABDOMEN: Soft, nontender, nondistended. Bowel sounds present. No organomegaly or mass.  EXTREMITIES: No cyanosis, clubbing or edema b/l.    NEUROLOGIC: Cranial nerves II through XII are intact. No focal Motor or sensory deficits b/l.   PSYCHIATRIC: The patient is alert and awake SKIN: No obvious rash, lesion, or ulcer.   LABORATORY PANEL:   CBC  Recent Labs Lab 03/28/16 1649  WBC 4.7  HGB 12.7*  HCT 38.5*  PLT 185    ------------------------------------------------------------------------------------------------------------------ Chemistries   Recent Labs Lab 03/26/16 1120  03/28/16 1649  NA 138  < > 142  K 3.4*  < > 3.4*  CL 97*  < > 105  CO2 28  < > 32  GLUCOSE 91  < > 145*  BUN 12  < > 6  CREATININE 0.70  < > 0.69  CALCIUM 9.0  < > 8.9  AST 50*  --   --   ALT 32  --   --   ALKPHOS 51  --   --   BILITOT 0.7  --   --   < > = values in this interval not displayed. ------------------------------------------------------------------------------------------------------------------  Cardiac Enzymes  Recent Labs Lab 03/28/16 1649  TROPONINI <0.03   ------------------------------------------------------------------------------------------------------------------  RADIOLOGY:  No results found.   ASSESSMENT AND PLAN:   * Bibasilar pneumonia with sepsis and acute on chronic respiratory failure IV Zosyn. Influenza negative. Blood cultures are negative 2, urine cultures negative, MRSA PCR is negative, sputum cultures were not obtained Weaning oxygen as tolerated. Nebulizers when necessary    * Seizures, New onset MRI brain. EEG. Unremarkable Seizure precautions. No maintenance Keppra was recommended. Depakote was restarted at 500 mg twice daily dose. No further seizures were noted. Etiology of seizures this point is unclear, appreciate neurology's input.  * Hypothyroidism   Continue levothyroxine.  * Interstitial lung disease and on chronic immune suppressant.   Continue meds for now.  * Schizophrenia and hallucinations.   Psychiatry consulted. Appreciate input. Will continue Depakote  *The mental status was felt to be due to medication noncompliance and sedation. Initiating these medications, resolved, no  further interventions, MRI of brain was unremarkable, but cerebral atrophy  All the records are reviewed and case discussed with Care Management/Social  Workerr. Management plans discussed with the patient, family and they are in agreement.  CODE STATUS: FULL CODE  DVT Prophylaxis: SCDs  TOTAL TIME TAKING CARE OF THIS PATIENT: 35 minutes.   POSSIBLE D/C IN 1-2 DAYS, DEPENDING ON CLINICAL CONDITION.  [name],Makhia Vosler M.D on 03/30/2016 at 5:25 PM  Between 7am to 6pm - Pager - [phone]  After 6pm go to [email or web address] - password [email or web address] [email or web address]  [facility] [place] [facility]  Office  [phone]  CC: Primary care physician; Lance BoschGregg A Warshaw, MD  Note: This dictation was prepared with Dragon dictation along with smaller phrase technology. Any transcriptional errors that result from this process are unintentional.

## 2016-03-30 NOTE — Progress Notes (Signed)
Pt called out to desk stating he fells "like a seizure is coming on" he states his "feet are starting to wiggle". Ativan given VSS. No seizure activity noted

## 2016-03-30 NOTE — Progress Notes (Signed)
Subjective: Mentation appears to have improved. No seizures overnight.   Objective: Current vital signs: BP (!) 146/74   Pulse 82   Temp 98.1 F (36.7 C) (Oral)   Resp 16   Ht 5' 9.5" (1.765 m)   Wt 68 kg (150 lb)   SpO2 97%   BMI 21.83 kg/m  Vital signs in last 24 hours: Temp:  [96.7 F (35.9 C)-98.4 F (36.9 C)] 98.1 F (36.7 C) (01/06 0811) Pulse Rate:  [82-112] 82 (01/06 0811) Resp:  [16-22] 16 (01/06 0811) BP: (96-153)/(51-82) 146/74 (01/06 0811) SpO2:  [94 %-100 %] 97 % (01/06 0811)  Intake/Output from previous day: 01/05 0701 - 01/06 0700 In: 201 [I.V.:201] Out: 3600 [Urine:3600] Intake/Output this shift: Total I/O In: 480 [P.O.:480] Out: -  Nutritional status: Diet regular Room service appropriate? Yes; Fluid consistency: Thin  Neurologic Exam: Mental Status: Lethargic.  Follows commands.  Speech fluent.   Cranial Nerves: II: Discs flat bilaterally; Visual fields grossly normal, pupils equal, round, reactive to light and accommodation III,IV, VI: ptosis not present, extra-ocular motions intact bilaterally V,VII: smile symmetric, facial light touch sensation normal bilaterally VIII: hearing normal bilaterally IX,X: gag reflex present XI: bilateral shoulder shrug XII: midline tongue extension Motor: Moves all extremities.  No focal weakness noted.     Lab Results: Basic Metabolic Panel:  Recent Labs Lab 03/26/16 1120 03/27/16 0424 03/28/16 0546 03/28/16 1649  NA 138 139 139 142  K 3.4* 2.9* 2.9* 3.4*  CL 97* 101 106 105  CO2 28 29 29  32  GLUCOSE 91 96 102* 145*  BUN 12 7 8 6   CREATININE 0.70 0.58* 0.54* 0.69  CALCIUM 9.0 8.3* 8.5* 8.9    Liver Function Tests:  Recent Labs Lab 03/26/16 1120  AST 50*  ALT 32  ALKPHOS 51  BILITOT 0.7  PROT 6.9  ALBUMIN 3.9   No results for input(s): LIPASE, AMYLASE in the last 168 hours.  Recent Labs Lab 03/28/16 1649  AMMONIA 28    CBC:  Recent Labs Lab 03/26/16 1120 03/27/16 0424  03/28/16 1649  WBC 13.6* 7.7 4.7  HGB 13.0 12.4* 12.7*  HCT 38.5* 37.1* 38.5*  MCV 86.2 86.3 87.8  PLT 228 199 185    Cardiac Enzymes:  Recent Labs Lab 03/26/16 1120 03/28/16 1649  TROPONINI <0.03 <0.03    Lipid Panel: No results for input(s): CHOL, TRIG, HDL, CHOLHDL, VLDL, LDLCALC in the last 168 hours.  CBG:  Recent Labs Lab 03/28/16 1552  GLUCAP 121*    Microbiology: Results for orders placed or performed during the hospital encounter of 03/26/16  Blood Culture (routine x 2)     Status: None (Preliminary result)   Collection Time: 03/26/16 11:20 AM  Result Value Ref Range Status   Specimen Description BLOOD LEFT AC  Final   Special Requests   Final    BOTTLES DRAWN AEROBIC AND ANAEROBIC AER ANA   Culture NO GROWTH 4 DAYS  Final   Report Status PENDING  Incomplete  Urine culture     Status: None   Collection Time: 03/26/16 11:20 AM  Result Value Ref Range Status   Specimen Description URINE, RANDOM  Final   Special Requests NONE  Final   Culture NO GROWTH Performed at [facility]   Final   Report Status 03/27/2016 FINAL  Final  Blood Culture (routine x 2)     Status: None (Preliminary result)   Collection Time: 03/26/16  2:30 PM  Result Value Ref Range  Status   Specimen Description BLOOD L AC  Final   Special Requests   Final    BOTTLES DRAWN AEROBIC AND ANAEROBIC AER 6ML ANA 7ML   Culture NO GROWTH 4 DAYS  Final   Report Status PENDING  Incomplete  MRSA PCR Screening     Status: None   Collection Time: 03/26/16  3:34 PM  Result Value Ref Range Status   MRSA by PCR NEGATIVE NEGATIVE Final    Comment:        The GeneXpert MRSA Assay (FDA approved for NASAL specimens only), is one component of a comprehensive MRSA colonization surveillance program. It is not intended to diagnose MRSA infection nor to guide or monitor treatment for MRSA infections.     Coagulation Studies: No results for input(s): LABPROT, INR in the last 72  hours.  Imaging: Mr Laqueta JeanBrain W ZOWo Contrast  Result Date: 03/28/2016 CLINICAL DATA:  Initial evaluation for acute seizure. History of CVA, dementia, seizure, and schizophrenia. EXAM: MRI HEAD WITHOUT AND WITH CONTRAST TECHNIQUE: Multiplanar, multiecho pulse sequences of the brain and surrounding structures were obtained without and with intravenous contrast. CONTRAST:  20mL MULTIHANCE GADOBENATE DIMEGLUMINE 529 MG/ML IV SOLN COMPARISON:  Prior CT from 03/26/2016. FINDINGS: Brain: Study is mildly degraded by motion artifact. Diffuse prominence of the CSF containing spaces is compatible with moderate generalized cerebral atrophy. No significant cerebral Piscitello matter disease for patient age. No abnormal foci of restricted diffusion to suggest acute or subacute ischemia. Gray-Missildine matter differentiation maintained. No evidence for acute or chronic intracranial hemorrhage. No definite areas of chronic infarction identified. No mass lesion, midline shift or mass effect. Ventricles are normal in size without hydrocephalus. No extra-axial fluid collection. Major dural sinuses are patent. No abnormal enhancement. No intrinsic temporal lobe abnormality. Incidental note made of a partially empty sella. Vascular: Major intracranial vascular flow voids are maintained. Skull and upper cervical spine: Craniocervical junction within normal limits. Mild multilevel degenerative spondylolysis noted within the upper cervical spine without significant stenosis. Bone marrow signal intensity within normal limits. No scalp soft tissue abnormality. Sinuses/Orbits: Globes and orbits grossly unremarkable. Mild scattered mucosal thickening within the ethmoidal air cells. Paranasal sinuses are otherwise clear. No mastoid effusion. Inner ear structures grossly normal. IMPRESSION: 1. No acute or reversible intracranial process identified. 2. Moderate generalized cerebral atrophy. Electronically Signed   By: Rise MuBenjamin  McClintock M.D.   On:  03/28/2016 15:17    Medications:  I have reviewed the patient's current medications. Scheduled: . alfuzosin  10 mg Oral Q breakfast  . atorvastatin  40 mg Oral Daily  . darifenacin  7.5 mg Oral Daily  . divalproex  500 mg Oral Q12H  . DULoxetine  30 mg Oral Daily  . heparin  5,000 Units Subcutaneous Q8H  . levothyroxine  50 mcg Oral QAC breakfast  . LORazepam  0.25 mg Oral BID  . mometasone-formoterol  2 puff Inhalation BID  . mycophenolate  1,500 mg Oral BID  . nystatin   Topical TID  . pantoprazole  40 mg Oral Daily  . piperacillin-tazobactam (ZOSYN)  IV  3.375 g Intravenous Q8H  . predniSONE  10 mg Oral Daily  . risperiDONE  2 mg Oral BID  . sodium chloride flush  3 mL Intravenous Q12H    Assessment/Plan: Patient was actually lethargic most of the day yesterday.  Unclear that was actually seizure since patient had EEG yesterday as well that was only significant for low voltage sleep.  Patient with unclear compliance with medications  at home.  Compliance here may be causing some sedation.    Mentation improved. Agree with d/c Keppra Call with questions.    03/30/2016  11:06 AM

## 2016-03-31 ENCOUNTER — Inpatient Hospital Stay: Payer: Medicare Other

## 2016-03-31 DIAGNOSIS — J9611 Chronic respiratory failure with hypoxia: Secondary | ICD-10-CM

## 2016-03-31 DIAGNOSIS — E039 Hypothyroidism, unspecified: Secondary | ICD-10-CM

## 2016-03-31 DIAGNOSIS — J849 Interstitial pulmonary disease, unspecified: Secondary | ICD-10-CM

## 2016-03-31 DIAGNOSIS — J189 Pneumonia, unspecified organism: Secondary | ICD-10-CM

## 2016-03-31 LAB — CBC
HEMATOCRIT: 41.3 % (ref 40.0–52.0)
Hemoglobin: 13.7 g/dL (ref 13.0–18.0)
MCH: 29.1 pg (ref 26.0–34.0)
MCHC: 33.3 g/dL (ref 32.0–36.0)
MCV: 87.5 fL (ref 80.0–100.0)
PLATELETS: 231 10*3/uL (ref 150–440)
RBC: 4.72 MIL/uL (ref 4.40–5.90)
RDW: 14.9 % — AB (ref 11.5–14.5)
WBC: 9 10*3/uL (ref 3.8–10.6)

## 2016-03-31 LAB — CULTURE, BLOOD (ROUTINE X 2)
CULTURE: NO GROWTH
Culture: NO GROWTH

## 2016-03-31 MED ORDER — LEVOFLOXACIN 500 MG PO TABS: 500.0000 mg | ORAL_TABLET | Freq: Every day | ORAL | 0 refills | Status: DC

## 2016-03-31 MED ORDER — AMOXICILLIN-POT CLAVULANATE 875-125 MG PO TABS
1.0000 | ORAL_TABLET | Freq: Two times a day (BID) | ORAL | Status: DC
  Administered 2016-03-31 – 2016-04-01 (×2): 1 via ORAL
  Filled 2016-03-31 (×3): qty 1

## 2016-03-31 NOTE — Progress Notes (Signed)
[facility] [place] [facility]   PATIENT NAME: Mason May    MR#:  [id number]  DATE OF BIRTH:  24-Sep-1945  SUBJECTIVE:  CHIEF COMPLAINT:   Chief Complaint  Patient presents with  . Altered Mental Status  . Seizures   cough. shortness of breath are  better.  No further seizures  Patient had one episode of being unresponsive in the evening of 03/28/2016. Woke up on his own. Did receive 1 dose of IV Keppra for possible seizures.  Depakote Resumed by psychiatry. Patient feels good today, denies any significant problems, he was evaluated by physical therapist and recommended skilled nursing facility placement. Social worker is involved for placement next week  REVIEW OF SYSTEMS:    Review of Systems  Unable to perform ROS: Mental acuity   DRUG ALLERGIES:  No Known Allergies  VITALS:  Blood pressure 137/78, pulse (!) 126, temperature 98.2 F (36.8 C), temperature source Oral, resp. rate 16, height 5' 9.5" (1.765 m), weight 68 kg (150 lb), SpO2 94 %.  PHYSICAL EXAMINATION:   Physical Exam  GENERAL:  71 y.o.-year-old patient lying in the bed with no acute distress.  EYES: Pupils equal, round, reactive to light and accommodation. No scleral icterus. Extraocular muscles intact.  HEENT: Head atraumatic, normocephalic. Oropharynx and nasopharynx clear.  NECK:  Supple, no jugular venous distention. No thyroid enlargement, no tenderness.  LUNGS: Normal breath sounds bilaterally, no wheezing, rales, rhonchi. No use of accessory muscles of respiration.  CARDIOVASCULAR: S1, S2 normal. No murmurs, rubs, or gallops.  ABDOMEN: Soft, nontender, nondistended. Bowel sounds present. No organomegaly or mass.  EXTREMITIES: No cyanosis, clubbing or edema b/l.    NEUROLOGIC: Cranial nerves II through XII are intact. No focal Motor or sensory deficits b/l.   PSYCHIATRIC: The patient is alert and awake SKIN: No obvious rash, lesion, or ulcer.   LABORATORY PANEL:    CBC  Recent Labs Lab 03/31/16 0531  WBC 9.0  HGB 13.7  HCT 41.3  PLT 231   ------------------------------------------------------------------------------------------------------------------ Chemistries   Recent Labs Lab 03/26/16 1120  03/28/16 1649  NA 138  < > 142  K 3.4*  < > 3.4*  CL 97*  < > 105  CO2 28  < > 32  GLUCOSE 91  < > 145*  BUN 12  < > 6  CREATININE 0.70  < > 0.69  CALCIUM 9.0  < > 8.9  AST 50*  --   --   ALT 32  --   --   ALKPHOS 51  --   --   BILITOT 0.7  --   --   < > = values in this interval not displayed. ------------------------------------------------------------------------------------------------------------------  Cardiac Enzymes  Recent Labs Lab 03/28/16 1649  TROPONINI <0.03   ------------------------------------------------------------------------------------------------------------------  RADIOLOGY:  Dg Chest 2 View  Result Date: 03/31/2016 CLINICAL DATA:  71 year old male with history of bibasilar pneumonia and sepsis. Acute on chronic respiratory failure. EXAM: CHEST  2 VIEW COMPARISON:  Chest x-ray 03/26/2016. FINDINGS: Lung volumes have increased slightly. Patchy multifocal interstitial and airspace opacities are again noted throughout the lungs bilaterally, similar to numerous prior examinations, overall with slightly improved aeration compared to the recent prior study. No pleural effusions. No evidence of pulmonary edema. Heart size is normal. Upper mediastinal contours are within normal limits. IMPRESSION: 1. Fat Improving aeration throughout the lungs bilaterally. Given the slight improvement in patchy multifocal interstitial and airspace disease compared to the prior study, findings are favored to reflect a  resolving multilobar bronchopneumonia. However, some of the residual abnormality in the appearance of the lungs is likely chronic, potentially sequela of prior infection with associated post infectious/inflammatory scarring.  Electronically Signed   By: Trudie Reed M.D.   On: 03/31/2016 10:36     ASSESSMENT AND PLAN:   * Bibasilar pneumonia with sepsis and acute on chronic respiratory failure IV Zosyn for the past 5 days. Influenza negative. Blood cultures are negative 2, urine cultures negative, MRSA PCR is negative, sputum cultures were not obtained Weaning oxygen as tolerated. Nebulizers when necessary. Chest x-ray today revealed improved aeration, change to Augmentin    * Seizures, New onset MRI brain. EEG. Unremarkable Seizure precautions. No maintenance Keppra was recommended. Depakote was restarted at 500 mg twice daily dose. No further seizures were noted. Etiology of seizures this point is unclear, appreciate neurology's input.  * Hypothyroidism   Continue levothyroxine.  * Interstitial lung disease and on chronic immune suppressant.   Continue meds for now.  * Schizophrenia and hallucinations.   Psychiatry consulted. Appreciate input. Will continue Depakote  *Unresponsive episode, the mental status was felt to be due to medication noncompliance and sedation. Resolved, no further interventions, MRI of brain was unremarkable, but cerebral atrophy  *generalized weakness, patient was intubated by physical therapist and recommended skilled nursing facility placement, Management is involved for bed search   All the records are reviewed and case discussed with Care Management/Social Workerr. Management plans discussed with the patient, family and they are in agreement.  CODE STATUS: FULL CODE  DVT Prophylaxis: SCDs  TOTAL TIME TAKING CARE OF THIS PATIENT: 35 minutes.   POSSIBLE D/C IN 1-2 DAYS, DEPENDING ON CLINICAL CONDITION.  Katharina Caper M.D on 03/31/2016 at 4:57 PM  Between 7am to 6pm - Pager - [phone]  After 6pm go to [email or web address] - password [email or web address] [facility]  [facility]  Office  [phone]  CC: Primary care physician; Lance Bosch, MD  Note: This  dictation was prepared with Dragon dictation along with smaller phrase technology. Any transcriptional errors that result from this process are unintentional.

## 2016-03-31 NOTE — Evaluation (Signed)
Physical Therapy Evaluation Patient Details Name: Mason May MRN: [id number] DOB: Jul 31, 1945 Today's Date: 03/31/2016   History of Present Illness  Pt is a 71 y/o M who for the last 4-5 days PTA was more confued, visual hallucinatino, and delusions.  In the ER he was noted to have a high lactic acid, confusion, bilateral pneumonia and he also had 2 episodes of seizures, one in EMS transportation and one in ER.  He had one episode of being unresponsive in the evening of 03/28/16.  Pt was in ER at [facility] 2 days PTA where he was found to have UTI and was given antibiotic.  Pt's PMH includes CVA, dementia, seizure, schizophrenia.    Clinical Impression  Pt admitted with above diagnosis. Pt currently with functional limitations due to the deficits listed below (see PT Problem List). Mr. Perkey reports he is Ind at home ambulating household distances without AD and denies any falls in the past 6 months.  He has an aide that comes 30 minutes/day and assists with sponge bath.  He currently requires close min guard assist for all aspects of mobility due to instability.  As pt lives alone, and given pt's current mobility status, recommending SNF at d/c.  Pt will benefit from skilled PT to increase their independence and safety with mobility to allow discharge to the venue listed below.      Follow Up Recommendations SNF    Equipment Recommendations  None recommended by PT    Recommendations for Other Services       Precautions / Restrictions Precautions Precautions: Fall;Other (comment) Precaution Comments: seizures during this admission; on home O2 Restrictions Weight Bearing Restrictions: No      Mobility  Bed Mobility Overal bed mobility: Needs Assistance Bed Mobility: Supine to Sit     Supine to sit: Min guard;HOB elevated     General bed mobility comments: Min guard for safety as pt performs without assist but is unsteady.  Transfers Overall transfer level: Needs  assistance Equipment used: Rolling walker (2 wheeled) Transfers: Sit to/from Stand Sit to Stand: Min guard         General transfer comment: Pt mildly unsteady. Pt slow to stand  Ambulation/Gait Ambulation/Gait assistance: Min guard Ambulation Distance (Feet): 40 Feet Assistive device: Rolling walker (2 wheeled) Gait Pattern/deviations: Step-through pattern;Decreased stride length Gait velocity: decreased Gait velocity interpretation: Below normal speed for age/gender General Gait Details: Decreased gait speed mild unsteadiness, requiring min guard assist  Stairs            Wheelchair Mobility    Modified Rankin (Stroke Patients Only)       Balance Overall balance assessment: Needs assistance Sitting-balance support: No upper extremity supported;Feet supported Sitting balance-Leahy Scale: Good     Standing balance support: No upper extremity supported;During functional activity Standing balance-Leahy Scale: Fair                               Pertinent Vitals/Pain Pain Assessment: No/denies pain    Home Living Family/patient expects to be discharged to:: Private residence Living Arrangements: Alone Available Help at Discharge: Family;Available PRN/intermittently Type of Home: House         Home Equipment: Walker - 2 wheels;Cane - single point      Prior Function Level of Independence: Needs assistance   Gait / Transfers Assistance Needed: Pt reports he ambulates in his home without assist.  Denies any falls in the past 6  months.  Limited ambulation in home to length of long oxygen cord.  ADL's / Homemaking Assistance Needed: Needs assist from aide for sponge bath.    Comments: Pt has aide that comes for 30 minutes each day.  Pt reports he was receiving HHPT and HHOT     Hand Dominance        Extremity/Trunk Assessment   Upper Extremity Assessment Upper Extremity Assessment: RUE deficits/detail (bruise L upper arm) RUE Deficits /  Details: Strength grossly 4/5, pt reports h/o dislocating R shoulder    Lower Extremity Assessment Lower Extremity Assessment: Overall WFL for tasks assessed       Communication   Communication: No difficulties  Cognition Arousal/Alertness: Awake/alert Behavior During Therapy: WFL for tasks assessed/performed Overall Cognitive Status: Within Functional Limits for tasks assessed                      General Comments      Exercises General Exercises - Upper Extremity Shoulder Flexion: AROM;Both;10 reps;Supine General Exercises - Lower Extremity Straight Leg Raises: Strengthening;Both;10 reps;Supine   Assessment/Plan    PT Assessment Patient needs continued PT services  PT Problem List Decreased strength;Decreased activity tolerance;Decreased balance;Decreased knowledge of use of DME;Decreased safety awareness;Cardiopulmonary status limiting activity          PT Treatment Interventions DME instruction;Gait training;Stair training;Functional mobility training;Therapeutic activities;Therapeutic exercise;Neuromuscular re-education;Balance training;Patient/family education    PT Goals (Current goals can be found in the Care Plan section)  Acute Rehab PT Goals Patient Stated Goal: rehab before home PT Goal Formulation: With patient Time For Goal Achievement: 04/14/16 Potential to Achieve Goals: Good    Frequency Min 2X/week   Barriers to discharge Decreased caregiver support lives alone    Co-evaluation               End of Session Equipment Utilized During Treatment: Gait belt;Oxygen Activity Tolerance: Patient tolerated treatment well Patient left: in chair;with call bell/phone within reach;with chair alarm set Nurse Communication: Mobility status         Time: [id number] PT Time Calculation (min) (ACUTE ONLY): 19 min   Charges:   PT Evaluation $PT Eval Low Complexity: 1 Procedure     PT G CodesEncarnacion Chu:        Ama Mcmaster PT, DPT 03/31/2016,  1:37 PM

## 2016-04-01 MED ORDER — MYCOPHENOLATE MOFETIL 250 MG PO CAPS: 1500.0000 mg | ORAL_CAPSULE | Freq: Two times a day (BID) | ORAL | 0 refills | Status: AC

## 2016-04-01 MED ORDER — DOCUSATE SODIUM 100 MG PO CAPS
100.0000 mg | ORAL_CAPSULE | Freq: Two times a day (BID) | ORAL | Status: DC
  Administered 2016-04-01: 100 mg via ORAL
  Filled 2016-04-01: qty 1

## 2016-04-01 MED ORDER — ROPINIROLE HCL 0.25 MG PO TABS
1.0000 mg | ORAL_TABLET | Freq: Two times a day (BID) | ORAL | Status: DC
  Administered 2016-04-01: 1 mg via ORAL
  Filled 2016-04-01: qty 4

## 2016-04-01 MED ORDER — LEVOFLOXACIN 500 MG PO TABS: 500.0000 mg | ORAL_TABLET | Freq: Every day | ORAL | 0 refills | Status: AC

## 2016-04-01 MED ORDER — DOXYCYCLINE HYCLATE 100 MG PO TABS
100.0000 mg | ORAL_TABLET | Freq: Two times a day (BID) | ORAL | Status: DC
  Administered 2016-04-01: 100 mg via ORAL
  Filled 2016-04-01: qty 1

## 2016-04-01 MED ORDER — DOCUSATE SODIUM 100 MG PO CAPS: 100.0000 mg | ORAL_CAPSULE | Freq: Two times a day (BID) | ORAL | 0 refills | Status: DC

## 2016-04-01 MED ORDER — ROPINIROLE HCL 1 MG PO TABS: 1.0000 mg | ORAL_TABLET | Freq: Two times a day (BID) | ORAL | 6 refills | Status: DC

## 2016-04-01 NOTE — Care Management Important Message (Signed)
Important Message  Patient Details  Name: Mason May MRN: [id number] Date of Birth: [birth year]   Medicare Important Message Given:  Yes    Chapman FitchBOWEN, Silena Wyss T, RN 04/01/2016, 12:05 PM

## 2016-04-01 NOTE — Clinical Social Work Note (Signed)
Patient to discharge today to [facility]. Patient and sister are in agreement and are aware. DC information sent to [facility]. York SpanielMonica Mayra Brahm MSW,LCSW [phone]

## 2016-04-01 NOTE — Discharge Summary (Signed)
[facility] - [place] [facility]   PATIENT NAME: Mason May    MR#:  [id number]  DATE OF BIRTH:  10-29-1945  DATE OF ADMISSION:  03/26/2016 ADMITTING PHYSICIAN: Altamese Dilling, MD  DATE OF DISCHARGE: No discharge date for patient encounter.  PRIMARY CARE PHYSICIAN: Lance Bosch, MD     ADMISSION DIAGNOSIS:  Sepsis, due to unspecified organism (HCC) [A41.9] Altered mental status, unspecified altered mental status type [R41.82]  DISCHARGE DIAGNOSIS:  Principal Problem:   Sepsis (HCC) Active Problems:   Chronic respiratory failure with hypoxia (HCC)   Pneumonia   Seizure (HCC)   Interstitial lung disease (HCC)   Schizo affective schizophrenia (HCC)   Hypothyroidism   Unresponsive episode   SECONDARY DIAGNOSIS:   Past Medical History:  Diagnosis Date  . Cerebral vascular disease   . Dementia   . Schizo affective schizophrenia (HCC)   . Seizures (HCC)     .pro HOSPITAL COURSE:   The patient is a 71 year old Caucasian male with medical history significant for history of dementia, schizoaffective schizophrenia, stroke, who presents to the hospital with complaints of confusion, chills, visual hallucinations,. In emergency room, his lactic acid level was noted to be high . Chest x-ray revealed bilateral pneumonia. While in the emergency room, patient had seizure and was admitted. The patient was initiated on supportive therapy, antibiotics, consultation with psychiatrist and neurologist were obtained. MRI of the brain revealed moderate cerebral atrophy, no acute changes. Electroencephalogram showed no epileptiform activity. No antiseizure medication was recommended by neurologist. Patient was advised to continue Depakote. With conservative therapy. His condition improved and he was felt to be stable to be discharged home. He was evaluated by physical therapist and recommended skilled nursing facility placement for rehabilitation, where he is  going to be discharged today prior to returning home. Discussion by problem: #1. Baby is no pneumonia and acute on chronic respiratory failure, patient was managed on IV Zosyn while in the hospital, changing to levofloxacin to complete the course. Patient's blood cultures were negative. Tapes 2, influenza a and B testing was negative, urine culture was negative. MRSA PCR was negative. Sputum cultures were not obtained. Repeat the chest x-ray revealed improvement of pneumonia. Patient's oxygenation remained stable and patient is on 2-1/2 L of oxygen with good saturations at 97% on the day of discharge #2. Seizures, new onset, MRI of the brain was unremarkable, an electroencephalogram revealed no epileptiform activity. Patient was given Keppra while in the hospital, however, was not recommended to be continued by neurologist. Patient was recommended to continue Depakote no further seizures were noted while in the hospital.. #3 hypothyroidism, patient is to continue levothyroxin #4. Interstitial lung disease, any chronic immunosuppressant, continue patient on CellCept #5. Schizophrenia and hallucinations, psychiatric consultation was obtained, he recommended to continue outpatient medications including Depakote #6. Unresponsive episode, it was felt that patient's mental status decreased due to medications and sedation with medications, suspected noncompliance. His medications at home. Patient's mental status normalized with conservative therapy alone #7. Generalized weakness, patient was evaluated by physical therapist and recommended skilled nursing facility placement, patient will be discharged to skilled nursing facility today, discussed with patient's sister today, updated medical condition DISCHARGE CONDITIONS:   Stable  CONSULTS OBTAINED:  Treatment Team:  Thana Farr, MD Audery Amel, MD Kym Groom, MD  DRUG ALLERGIES:  No Known Allergies  DISCHARGE MEDICATIONS:   Current  Discharge Medication List    START taking these medications   Details  docusate sodium (  COLACE) 100 MG capsule Take 1 capsule (100 mg total) by mouth 2 (two) times daily. Qty: 10 capsule, Refills: 0    levofloxacin (LEVAQUIN) 500 MG tablet Take 1 tablet (500 mg total) by mouth daily. Qty: 6 tablet, Refills: 0    mycophenolate (CELLCEPT) 250 MG capsule Take 6 capsules (1,500 mg total) by mouth 2 (two) times daily. Qty: 60 capsule, Refills: 0    rOPINIRole (REQUIP) 1 MG tablet Take 1 tablet (1 mg total) by mouth 2 (two) times daily. Qty: 60 tablet, Refills: 6      CONTINUE these medications which have NOT CHANGED   Details  alfuzosin (UROXATRAL) 10 MG 24 hr tablet Take 10 mg by mouth daily with breakfast.    atorvastatin (LIPITOR) 40 MG tablet Take 40 mg by mouth daily.    benzonatate (TESSALON) 100 MG capsule Take 100 mg by mouth 3 (three) times daily as needed for cough.    budesonide-formoterol (SYMBICORT) 160-4.5 MCG/ACT inhaler Inhale 2 puffs into the lungs 2 (two) times daily.    divalproex (DEPAKOTE) 500 MG DR tablet Take 500 mg by mouth 2 (two) times daily.    DULoxetine (CYMBALTA) 30 MG capsule Take 30 mg by mouth daily.    levothyroxine (SYNTHROID, LEVOTHROID) 50 MCG tablet Take 50 mcg by mouth daily before breakfast.    nystatin (MYCOSTATIN/NYSTOP) powder Apply topically 3 (three) times daily.    omeprazole (PRILOSEC) 40 MG capsule Take 40 mg by mouth daily.    predniSONE (DELTASONE) 10 MG tablet Take 10 mg by mouth daily.    risperiDONE (RISPERDAL) 2 MG tablet Take 2 mg by mouth 2 (two) times daily.    traMADol (ULTRAM) 50 MG tablet Take 50 mg by mouth every 6 (six) hours as needed for severe pain.    solifenacin (VESICARE) 10 MG tablet Take 10 mg by mouth daily.      STOP taking these medications     azithromycin (ZITHROMAX) 250 MG tablet      mycophenolate (CELLCEPT) 500 MG tablet          DISCHARGE INSTRUCTIONS:    The patient is to follow-up  with primary care physician as outpatient  If you experience worsening of your admission symptoms, develop shortness of breath, life threatening emergency, suicidal or homicidal thoughts you must seek medical attention immediately by calling 911 or calling your MD immediately  if symptoms less severe.  You Must read complete instructions/literature along with all the possible adverse reactions/side effects for all the Medicines you take and that have been prescribed to you. Take any new Medicines after you have completely understood and accept all the possible adverse reactions/side effects.   Please note  You were cared for by a hospitalist during your hospital stay. If you have any questions about your discharge medications or the care you received while you were in the hospital after you are discharged, you can call the unit and asked to speak with the hospitalist on call if the hospitalist that took care of you is not available. Once you are discharged, your primary care physician will handle any further medical issues. Please note that NO REFILLS for any discharge medications will be authorized once you are discharged, as it is imperative that you return to your primary care physician (or establish a relationship with a primary care physician if you do not have one) for your aftercare needs so that they can reassess your need for medications and monitor your lab values.    Today  CHIEF COMPLAINT:   Chief Complaint  Patient presents with  . Altered Mental Status  . Seizures    HISTORY OF PRESENT ILLNESS:  Majestic Brister  is a 71 y.o. male with a known history of dementia, schizoaffective schizophrenia, stroke, who presents to the hospital with complaints of confusion, chills, visual hallucinations,. In emergency room, his lactic acid level was noted to be high . Chest x-ray revealed bilateral pneumonia. While in the emergency room, patient had seizure and was admitted. The patient was  initiated on supportive therapy, antibiotics, consultation with psychiatrist and neurologist were obtained. MRI of the brain revealed moderate cerebral atrophy, no acute changes. Electroencephalogram showed no epileptiform activity. No antiseizure medication was recommended by neurologist. Patient was advised to continue Depakote. With conservative therapy. His condition improved and he was felt to be stable to be discharged home. He was evaluated by physical therapist and recommended skilled nursing facility placement for rehabilitation, where he is going to be discharged today prior to returning home. Discussion by problem: #1. Baby is no pneumonia and acute on chronic respiratory failure, patient was managed on IV Zosyn while in the hospital, changing to levofloxacin to complete the course. Patient's blood cultures were negative. Tapes 2, influenza a and B testing was negative, urine culture was negative. MRSA PCR was negative. Sputum cultures were not obtained. Repeat the chest x-ray revealed improvement of pneumonia. Patient's oxygenation remained stable and patient is on 2-1/2 L of oxygen with good saturations at 97% on the day of discharge #2. Seizures, new onset, MRI of the brain was unremarkable, an electroencephalogram revealed no epileptiform activity. Patient was given Keppra while in the hospital, however, was not recommended to be continued by neurologist. Patient was recommended to continue Depakote no further seizures were noted while in the hospital.. #3 hypothyroidism, patient is to continue levothyroxin #4. Interstitial lung disease, any chronic immunosuppressant, continue patient on CellCept #5. Schizophrenia and hallucinations, psychiatric consultation was obtained, he recommended to continue outpatient medications including Depakote #6. Unresponsive episode, it was felt that patient's mental status decreased due to medications and sedation with medications, suspected noncompliance. His  medications at home. Patient's mental status normalized with conservative therapy alone #7. Generalized weakness, patient was evaluated by physical therapist and recommended skilled nursing facility placement, patient will be discharged to skilled nursing facility today, discussed with patient's sister today, updated medical condition    VITAL SIGNS:  Blood pressure (!) 146/77, pulse 96, temperature 98.5 F (36.9 C), temperature source Oral, resp. rate 20, height 5' 9.5" (1.765 m), weight 68 kg (150 lb), SpO2 97 %.  I/O:   Intake/Output Summary (Last 24 hours) at 04/01/16 1224 Last data filed at 04/01/16 0900  Gross per 24 hour  Intake              480 ml  Output              625 ml  Net             -145 ml    PHYSICAL EXAMINATION:  GENERAL:  71 y.o.-year-old patient lying in the bed with no acute distress.  EYES: Pupils equal, round, reactive to light and accommodation. No scleral icterus. Extraocular muscles intact.  HEENT: Head atraumatic, normocephalic. Oropharynx and nasopharynx clear.  NECK:  Supple, no jugular venous distention. No thyroid enlargement, no tenderness.  LUNGS: Normal breath sounds bilaterally, no wheezing, rales,rhonchi or crepitation. No use of accessory muscles of respiration.  CARDIOVASCULAR: S1, S2 normal. No murmurs, rubs, or  gallops.  ABDOMEN: Soft, non-tender, non-distended. Bowel sounds present. No organomegaly or mass.  EXTREMITIES: No pedal edema, cyanosis, or clubbing.  NEUROLOGIC: Cranial nerves II through XII are intact. Muscle strength 5/5 in all extremities. Sensation intact. Gait not checked.  PSYCHIATRIC: The patient is alert and oriented x 3.  SKIN: No obvious rash, lesion, or ulcer.   DATA REVIEW:   CBC  Recent Labs Lab 03/31/16 0531  WBC 9.0  HGB 13.7  HCT 41.3  PLT 231    Chemistries   Recent Labs Lab 03/26/16 1120  03/28/16 1649  NA 138  < > 142  K 3.4*  < > 3.4*  CL 97*  < > 105  CO2 28  < > 32  GLUCOSE 91  < > 145*   BUN 12  < > 6  CREATININE 0.70  < > 0.69  CALCIUM 9.0  < > 8.9  AST 50*  --   --   ALT 32  --   --   ALKPHOS 51  --   --   BILITOT 0.7  --   --   < > = values in this interval not displayed.  Cardiac Enzymes  Recent Labs Lab 03/28/16 1649  TROPONINI <0.03    Microbiology Results  Results for orders placed or performed during the hospital encounter of 03/26/16  Blood Culture (routine x 2)     Status: None   Collection Time: 03/26/16 11:20 AM  Result Value Ref Range Status   Specimen Description BLOOD LEFT AC  Final   Special Requests   Final    BOTTLES DRAWN AEROBIC AND ANAEROBIC AER ANA   Culture NO GROWTH 5 DAYS  Final   Report Status 03/31/2016 FINAL  Final  Urine culture     Status: None   Collection Time: 03/26/16 11:20 AM  Result Value Ref Range Status   Specimen Description URINE, RANDOM  Final   Special Requests NONE  Final   Culture NO GROWTH Performed at [facility]   Final   Report Status 03/27/2016 FINAL  Final  Blood Culture (routine x 2)     Status: None   Collection Time: 03/26/16  2:30 PM  Result Value Ref Range Status   Specimen Description BLOOD L AC  Final   Special Requests   Final    BOTTLES DRAWN AEROBIC AND ANAEROBIC AER ANA   Culture NO GROWTH 5 DAYS  Final   Report Status 03/31/2016 FINAL  Final  MRSA PCR Screening     Status: None   Collection Time: 03/26/16  3:34 PM  Result Value Ref Range Status   MRSA by PCR NEGATIVE NEGATIVE Final    Comment:        The GeneXpert MRSA Assay (FDA approved for NASAL specimens only), is one component of a comprehensive MRSA colonization surveillance program. It is not intended to diagnose MRSA infection nor to guide or monitor treatment for MRSA infections.     RADIOLOGY:  Dg Chest 2 View  Result Date: 03/31/2016 CLINICAL DATA:  71 year old male with history of bibasilar pneumonia and sepsis. Acute on chronic respiratory failure. EXAM: CHEST  2 VIEW COMPARISON:  Chest  x-ray 03/26/2016. FINDINGS: Lung volumes have increased slightly. Patchy multifocal interstitial and airspace opacities are again noted throughout the lungs bilaterally, similar to numerous prior examinations, overall with slightly improved aeration compared to the recent prior study. No pleural effusions. No evidence of pulmonary edema. Heart size is normal. Upper  mediastinal contours are within normal limits. IMPRESSION: 1. Fat Improving aeration throughout the lungs bilaterally. Given the slight improvement in patchy multifocal interstitial and airspace disease compared to the prior study, findings are favored to reflect a resolving multilobar bronchopneumonia. However, some of the residual abnormality in the appearance of the lungs is likely chronic, potentially sequela of prior infection with associated post infectious/inflammatory scarring. Electronically Signed   By: Trudie Reedaniel  Entrikin M.D.   On: 03/31/2016 10:36    EKG:   Orders placed or performed during the hospital encounter of 03/26/16  . ED EKG  . ED EKG  . EKG 12-Lead  . EKG 12-Lead  . EKG 12-Lead  . EKG 12-Lead  . EKG 12-Lead  . EKG 12-Lead  . EKG      Management plans discussed with the patient, family and they are in agreement.  CODE STATUS:     Code Status Orders        Start     Ordered   03/26/16 1611  Do not attempt resuscitation (DNR)  Continuous    Question Answer Comment  In the event of cardiac or respiratory ARREST Do not call a "code blue"   In the event of cardiac or respiratory ARREST Do not perform Intubation, CPR, defibrillation or ACLS   In the event of cardiac or respiratory ARREST Use medication by any route, position, wound care, and other measures to relive pain and suffering. May use oxygen, suction and manual treatment of airway obstruction as needed for comfort.      03/26/16 1610    Code Status History    Date Active Date Inactive Code Status Order ID Comments User Context   This patient  has a current code status but no historical code status.      TOTAL TIME TAKING CARE OF THIS PATIENT: 40 minutes.  Discussed with care management, social worker and the patient's sister, Mrs. Selena BattenKim, all questions were answered, she voiced understanding and appreciation  Keen Ewalt M.D on 04/01/2016 at 12:24 PM  Between 7am to 6pm - Pager - [phone]  After 6pm go to [email or web address] - password [email or web address] [facility]  [facility]  Office  [phone]  CC: Primary care physician; Lance BoschGregg A Warshaw, MD

## 2016-04-01 NOTE — [place] FL2 (Signed)
[place] MEDICAID FL2 LEVEL OF CARE SCREENING TOOL     IDENTIFICATION  Patient Name: Mason May Birthdate: 1945/06/11 Sex: male Admission Date (Current Location): 03/26/2016  [place] and [place] Number:  Chiropodist and Address:  [facility], [address], [place], [place] [address]      Provider Number: [id number]  Attending Physician Name and Address:  Katharina Caper, MD  Relative Name and Phone Number:       Current Level of Care: Hospital Recommended Level of Care: Skilled Nursing Facility Prior Approval Number:    Date Approved/Denied:   PASRR Number:    Discharge Plan: SNF    Current Diagnoses: Patient Active Problem List   Diagnosis Date Noted  . Chronic respiratory failure with hypoxia (HCC) 03/31/2016  . Pneumonia 03/31/2016  . Hypothyroidism 03/31/2016  . Interstitial lung disease (HCC) 03/31/2016  . Unresponsive episode   . Schizo affective schizophrenia (HCC) 03/27/2016  . Sepsis (HCC) 03/26/2016  . Seizure (HCC) 03/26/2016    Orientation RESPIRATION BLADDER Height & Weight     Self, Time, Situation, Place  Normal, O2 (2.5 liters) Incontinent Weight: 150 lb (68 kg) Height:  5' 9.5" (176.5 cm)  BEHAVIORAL SYMPTOMS/MOOD NEUROLOGICAL BOWEL NUTRITION STATUS   (none) Convulsions/Seizures Incontinent    AMBULATORY STATUS COMMUNICATION OF NEEDS Skin   Limited Assist Verbally Normal                       Personal Care Assistance Level of Assistance  Bathing, Dressing Bathing Assistance: Limited assistance   Dressing Assistance: Limited assistance     Functional Limitations Info             SPECIAL CARE FACTORS FREQUENCY  PT (By licensed PT)                    Contractures Contractures Info: Not present    Additional Factors Info  Code Status Code Status Info: dnr             Current Medications (04/01/2016):  This is the current hospital active medication list Current  Facility-Administered Medications  Medication Dose Route Frequency Provider Last Rate Last Dose  . acetaminophen (TYLENOL) tablet 650 mg  650 mg Oral Q6H PRN Milagros Loll, MD   650 mg at 04/01/16 0703  . alfuzosin (UROXATRAL) 24 hr tablet 10 mg  10 mg Oral Q breakfast Altamese Dilling, MD   10 mg at 04/01/16 0842  . atorvastatin (LIPITOR) tablet 40 mg  40 mg Oral Daily Altamese Dilling, MD   40 mg at 03/31/16 1739  . benzonatate (TESSALON) capsule 100 mg  100 mg Oral TID PRN Altamese Dilling, MD      . darifenacin (ENABLEX) 24 hr tablet 7.5 mg  7.5 mg Oral Daily Altamese Dilling, MD   7.5 mg at 04/01/16 1106  . divalproex (DEPAKOTE) DR tablet 500 mg  500 mg Oral Q12H Audery Amel, MD   500 mg at 04/01/16 1107  . docusate sodium (COLACE) capsule 100 mg  100 mg Oral BID Katharina Caper, MD   100 mg at 04/01/16 0842  . doxycycline (VIBRA-TABS) tablet 100 mg  100 mg Oral Q12H Katharina Caper, MD   100 mg at 04/01/16 1239  . DULoxetine (CYMBALTA) DR capsule 30 mg  30 mg Oral Daily Altamese Dilling, MD   30 mg at 04/01/16 1106  . heparin injection 5,000 Units  5,000 Units Subcutaneous Q8H Altamese Dilling, MD   5,000  Units at 04/01/16 0535  . levothyroxine (SYNTHROID, LEVOTHROID) tablet 50 mcg  50 mcg Oral QAC breakfast Altamese DillingVaibhavkumar Vachhani, MD   50 mcg at 04/01/16 0842  . LORazepam (ATIVAN) injection 2 mg  2 mg Intravenous Q6H PRN Altamese DillingVaibhavkumar Vachhani, MD   2 mg at 04/01/16 0703  . LORazepam (ATIVAN) tablet 0.25 mg  0.25 mg Oral BID Audery AmelJohn T Clapacs, MD   0.25 mg at 04/01/16 1107  . mometasone-formoterol (DULERA) 200-5 MCG/ACT inhaler 2 puff  2 puff Inhalation BID Altamese DillingVaibhavkumar Vachhani, MD   2 puff at 04/01/16 0842  . mycophenolate (CELLCEPT) capsule 1,500 mg  1,500 mg Oral BID Altamese DillingVaibhavkumar Vachhani, MD   1,500 mg at 04/01/16 1119  . nystatin (MYCOSTATIN/NYSTOP) topical powder   Topical TID Altamese DillingVaibhavkumar Vachhani, MD      . ondansetron (ZOFRAN) injection 4 mg  4 mg  Intravenous Q6H PRN Srikar Sudini, MD      . pantoprazole (PROTONIX) EC tablet 40 mg  40 mg Oral Daily Altamese DillingVaibhavkumar Vachhani, MD   40 mg at 04/01/16 1106  . predniSONE (DELTASONE) tablet 10 mg  10 mg Oral Daily Altamese DillingVaibhavkumar Vachhani, MD   10 mg at 04/01/16 1106  . risperiDONE (RISPERDAL) tablet 2 mg  2 mg Oral BID Altamese DillingVaibhavkumar Vachhani, MD   2 mg at 04/01/16 1105  . rOPINIRole (REQUIP) tablet 1 mg  1 mg Oral BID Katharina Caperima Makiah Foye, MD   1 mg at 04/01/16 1119  . sodium chloride flush (NS) 0.9 % injection 3 mL  3 mL Intravenous Q12H Altamese DillingVaibhavkumar Vachhani, MD   3 mL at 04/01/16 1000  . traMADol (ULTRAM) tablet 50 mg  50 mg Oral Q6H PRN Altamese DillingVaibhavkumar Vachhani, MD   50 mg at 04/01/16 [id number]     Discharge Medications: Please see discharge summary for a list of discharge medications.  Relevant Imaging Results:  Relevant Lab Results:   Additional Information    York SpanielMonica Marra, LCSW

## 2016-04-01 NOTE — Care Management (Signed)
Notified Tim with [facility] that patient would be discharging to [facility].

## 2016-04-01 NOTE — Clinical Social Work Placement (Signed)
   CLINICAL SOCIAL WORK PLACEMENT  NOTE  Date:  04/01/2016  Patient Details  Name: Mason May MRN: [id number] Date of Birth: 1946/01/19  Clinical Social Work is seeking post-discharge placement for this patient at the Skilled  Nursing Facility level of care (*CSW will initial, date and re-position this form in  chart as items are completed):  Yes   Patient/family provided with [facility] Clinical Social Work Department's list of facilities offering this level of care within the geographic area requested by the patient (or if unable, by the patient's family).  Yes   Patient/family informed of their freedom to choose among providers that offer the needed level of care, that participate in Medicare, Medicaid or managed care program needed by the patient, have an available bed and are willing to accept the patient.  Yes   Patient/family informed of [facility]'s ownership interest in [facility] and [facility], as well as of the fact that they are under no obligation to receive care at these facilities.  PASRR submitted to EDS on 03/31/16     PASRR number received on 03/31/16     Existing PASRR number confirmed on       FL2 transmitted to all facilities in geographic area requested by pt/family on 04/01/16     FL2 transmitted to all facilities within larger geographic area on       Patient informed that his/her managed care company has contracts with or will negotiate with certain facilities, including the following:        Yes   Patient/family informed of bed offers received.  Patient chooses bed at  [facility])     Physician recommends and patient chooses bed at  [facility](SNF)    Patient to be transferred to  [facility]) on 04/01/16.  Patient to be transferred to facility by  (EMS)     Patient family notified on 04/01/16 of transfer.  Name of family member notified:   (patient's sister)     PHYSICIAN       Additional Comment:     _______________________________________________ York SpanielMonica Demarlo Riojas, LCSW 04/01/2016, 2:01 PM

## 2016-04-01 NOTE — Progress Notes (Signed)
Alert and oriented. Vital signs stable . No signs of acute distress. Discharge instructions given. Patient verbalized understanding. No other issues noted at this time.    

## 2017-01-25 ENCOUNTER — Emergency Department: Payer: Medicare Other

## 2017-01-25 ENCOUNTER — Emergency Department
Admission: EM | Admit: 2017-01-25 | Discharge: 2017-01-25 | Disposition: A | Discharge status: Home / Self Care | Payer: Medicare Other | Attending: Student in an Organized Health Care Education/Training Program | Admitting: Student in an Organized Health Care Education/Training Program

## 2017-01-25 DIAGNOSIS — E039 Hypothyroidism, unspecified: Secondary | ICD-10-CM | POA: Diagnosis not present

## 2017-01-25 DIAGNOSIS — F259 Schizoaffective disorder, unspecified: Secondary | ICD-10-CM | POA: Insufficient documentation

## 2017-01-25 DIAGNOSIS — F0390 Unspecified dementia without behavioral disturbance: Secondary | ICD-10-CM | POA: Diagnosis not present

## 2017-01-25 DIAGNOSIS — J449 Chronic obstructive pulmonary disease, unspecified: Secondary | ICD-10-CM | POA: Insufficient documentation

## 2017-01-25 DIAGNOSIS — R079 Chest pain, unspecified: Secondary | ICD-10-CM | POA: Insufficient documentation

## 2017-01-25 DIAGNOSIS — Z79899 Other long term (current) drug therapy: Secondary | ICD-10-CM | POA: Diagnosis not present

## 2017-01-25 DIAGNOSIS — Z8673 Personal history of transient ischemic attack (TIA), and cerebral infarction without residual deficits: Secondary | ICD-10-CM | POA: Insufficient documentation

## 2017-01-25 LAB — COMPREHENSIVE METABOLIC PANEL
ALBUMIN: 3.1 g/dL — AB (ref 3.5–5.0)
ALT: 20 U/L (ref 17–63)
AST: 33 U/L (ref 15–41)
Alkaline Phosphatase: 46 U/L (ref 38–126)
Anion gap: 8 (ref 5–15)
BILIRUBIN TOTAL: 1 mg/dL (ref 0.3–1.2)
BUN: 18 mg/dL (ref 6–20)
CHLORIDE: 102 mmol/L (ref 101–111)
CO2: 29 mmol/L (ref 22–32)
Calcium: 9.1 mg/dL (ref 8.9–10.3)
Creatinine, Ser: 0.51 mg/dL — ABNORMAL LOW (ref 0.61–1.24)
GFR calc Af Amer: >60 mL/min (ref >60)
GFR calc non Af Amer: >60 mL/min (ref >60)
GLUCOSE: 121 mg/dL — AB (ref 65–99)
POTASSIUM: 3.3 mmol/L — AB (ref 3.5–5.1)
SODIUM: 139 mmol/L (ref 135–145)
TOTAL PROTEIN: 6 g/dL — AB (ref 6.5–8.1)

## 2017-01-25 LAB — CBC WITH DIFFERENTIAL/PLATELET
BASOS ABS: 0 10*3/uL (ref 0–0.1)
Basophils Relative: 1 %
EOS ABS: 0.1 10*3/uL (ref 0–0.7)
Eosinophils Relative: 1 %
HEMATOCRIT: 41.8 % (ref 40.0–52.0)
Hemoglobin: 13.9 g/dL (ref 13.0–18.0)
Lymphocytes Relative: 25 %
Lymphs Abs: 1.8 10*3/uL (ref 1.0–3.6)
MCH: 30.4 pg (ref 26.0–34.0)
MCHC: 33.3 g/dL (ref 32.0–36.0)
MCV: 91.2 fL (ref 80.0–100.0)
MONO ABS: 0.9 10*3/uL (ref 0.2–1.0)
Monocytes Relative: 12 %
NEUTROS ABS: 4.4 10*3/uL (ref 1.4–6.5)
NEUTROS PCT: 61 %
Platelets: 158 10*3/uL (ref 150–440)
RBC: 4.58 MIL/uL (ref 4.40–5.90)
RDW: 15.5 % — AB (ref 11.5–14.5)
WBC: 7.3 10*3/uL (ref 3.8–10.6)

## 2017-01-25 LAB — TROPONIN I

## 2017-01-25 LAB — BRAIN NATRIURETIC PEPTIDE: B Natriuretic Peptide: 11 pg/mL (ref 0.0–100.0)

## 2017-01-25 MED ORDER — IPRATROPIUM-ALBUTEROL 0.5-2.5 (3) MG/3ML IN SOLN
3.0000 mL | Freq: Once | RESPIRATORY_TRACT | Status: AC
  Administered 2017-01-25: 3 mL via RESPIRATORY_TRACT
  Filled 2017-01-25: qty 3

## 2017-01-25 MED ORDER — HYDROXYZINE HCL 25 MG PO TABS
25.0000 mg | ORAL_TABLET | Freq: Once | ORAL | Status: AC
  Administered 2017-01-25: 25 mg via ORAL
  Filled 2017-01-25: qty 1

## 2017-01-25 MED ORDER — HYDROXYZINE PAMOATE 25 MG PO CAPS: 25.0000 mg | ORAL_CAPSULE | Freq: Three times a day (TID) | ORAL | 0 refills | Status: DC | PRN

## 2017-01-25 MED ORDER — IOPAMIDOL (ISOVUE-370) INJECTION 76%
75.0000 mL | Freq: Once | INTRAVENOUS | Status: AC | PRN
  Administered 2017-01-25: 75 mL via INTRAVENOUS

## 2017-01-25 MED ORDER — LORAZEPAM 0.5 MG PO TABS
0.5000 mg | ORAL_TABLET | Freq: Once | ORAL | Status: DC
  Filled 2017-01-25: qty 1

## 2017-01-25 NOTE — ED Provider Notes (Signed)
 -----------------------------------------   4:50 PM on 01/25/2017 -----------------------------------------  Patient remains pain-free. Started feeling a bit anxious, but refused oral Ativan that was ordered by Dr. Roxan Hockeyobinson. Requests something "nonaddictive", and was offered Vistaril. Plan was to follow-up on a second troponin which has come back negative. Vital signs remain unremarkable except for mild tachycardia documented which corresponds to his anxiousness.  Workup is negative, not concerning for ACS PE dissection or pneumothorax at this time. Patient is suitable for follow-up with his primary care doctor this week.   [name], Mason Virgil, MD 01/25/17 [phone]

## 2017-01-25 NOTE — ED Provider Notes (Signed)
[facility] Emergency Department Provider Note    First MD Initiated Contact with Patient 01/25/17 1205     (approximate)  I have reviewed the triage vital signs and the nursing notes.   HISTORY  Chief Complaint Chest Pain (Pateint reports pressure on left side, PT also has COPD)    HPI Mason May is a 71 y.o. male with a history of COPD, heart disease and psychiatric illness presents from nursing facility with chief complaint of chest pain pressure and palpitations that started earlier this morning.  Has had similar episodes in the past.  Was given nitroglycerin with some improvement in symptoms but still feeling his heart flutter.  He is in no respiratory distress.  Denies any diaphoresis.  No change of position.  Past Medical History:  Diagnosis Date  . Cerebral vascular disease   . Dementia   . Schizo affective schizophrenia (HCC)   . Seizures (HCC)    Family History  Problem Relation Age of Onset  . Kidney cancer Mother   . Lung cancer Father    History reviewed. No pertinent surgical history. Patient Active Problem List   Diagnosis Date Noted  . Chronic respiratory failure with hypoxia (HCC) 03/31/2016  . Pneumonia 03/31/2016  . Hypothyroidism 03/31/2016  . Interstitial lung disease (HCC) 03/31/2016  . Unresponsive episode   . Schizo affective schizophrenia (HCC) 03/27/2016  . Sepsis (HCC) 03/26/2016  . Seizure (HCC) 03/26/2016      Prior to Admission medications   Medication Sig Start Date End Date Taking? Authorizing Provider  alfuzosin (UROXATRAL) 10 MG 24 hr tablet Take 10 mg by mouth daily with breakfast.    [provider]  atorvastatin (LIPITOR) 40 MG tablet Take 40 mg by mouth daily.    [provider]  benzonatate (TESSALON) 100 MG capsule Take 100 mg by mouth 3 (three) times daily as needed for cough.    [provider]  budesonide-formoterol (SYMBICORT) 160-4.5 MCG/ACT inhaler Inhale 2 puffs  into the lungs 2 (two) times daily.    [provider]  divalproex (DEPAKOTE) 500 MG DR tablet Take 500 mg by mouth 2 (two) times daily.    [provider]  docusate sodium (COLACE) 100 MG capsule Take 1 capsule (100 mg total) by mouth 2 (two) times daily. 04/01/16   Katharina Caper, MD  DULoxetine (CYMBALTA) 30 MG capsule Take 30 mg by mouth daily.    [provider]  levofloxacin (LEVAQUIN) 500 MG tablet Take 1 tablet (500 mg total) by mouth daily. 04/01/16   Katharina Caper, MD  levothyroxine (SYNTHROID, LEVOTHROID) 50 MCG tablet Take 50 mcg by mouth daily before breakfast.    [provider]  mycophenolate (CELLCEPT) 250 MG capsule Take 6 capsules (1,500 mg total) by mouth 2 (two) times daily. 04/01/16   Katharina Caper, MD  nystatin (MYCOSTATIN/NYSTOP) powder Apply topically 3 (three) times daily.    [provider]  omeprazole (PRILOSEC) 40 MG capsule Take 40 mg by mouth daily.    [provider]  predniSONE (DELTASONE) 10 MG tablet Take 10 mg by mouth daily.    [provider]  risperiDONE (RISPERDAL) 2 MG tablet Take 2 mg by mouth 2 (two) times daily.    [provider]  rOPINIRole (REQUIP) 1 MG tablet Take 1 tablet (1 mg total) by mouth 2 (two) times daily. 04/01/16   Katharina Caper, MD  solifenacin (VESICARE) 10 MG tablet Take 10 mg by mouth daily.    [provider]  traMADol (ULTRAM) 50 MG tablet Take 50 mg by mouth every 6 (six) hours as needed for severe pain.    [provider]    Allergies Patient has no known allergies.    Social History Social History  Substance Use Topics  . Smoking status: Never Smoker  . Smokeless tobacco: Never Used  . Alcohol use No    Review of Systems Patient denies headaches, rhinorrhea, blurry vision, numbness, shortness of breath, chest pain, edema, cough, abdominal pain, nausea, vomiting, diarrhea, dysuria, fevers, rashes or hallucinations unless otherwise stated  above in HPI. ____________________________________________   PHYSICAL EXAM:  VITAL SIGNS: Vitals:   01/25/17 1213 01/25/17 1300  BP: 116/71 122/80  Pulse: (!) 103 87  Resp: 15 16  Temp: 97.8 F (36.6 C)   SpO2: 93% 97%    Constitutional: Alert and oriented.  in no acute distress. Eyes: Conjunctivae are normal.  Head: Atraumatic. Nose: No congestion/rhinnorhea. Mouth/Throat: Mucous membranes are moist.   Neck: No stridor. Painless ROM.  Cardiovascular: Normal rate, regular rhythm. Grossly normal heart sounds.  Good peripheral circulation. Respiratory: Normal respiratory effort.  No retractions. Lungs with scattered wheeze and posterior crackles, no resp distress Gastrointestinal: Soft and nontender. No distention. No abdominal bruits. No CVA tenderness. Genitourinary:  Musculoskeletal: No lower extremity tenderness nor edema.  No joint effusions. Neurologic:  Normal speech and language. No gross focal neurologic deficits are appreciated. No facial droop Skin:  Skin is warm, dry and intact. No rash noted. Psychiatric:anxious appearing  ____________________________________________   LABS (all labs ordered are listed, but only abnormal results are displayed)  Results for orders placed or performed during the hospital encounter of 01/25/17 (from the past 24 hour(s))  Brain natriuretic peptide     Status: None   Collection Time: 01/25/17 12:21 PM  Result Value Ref Range   B Natriuretic Peptide 11.0 0.0 - 100.0 pg/mL  CBC with Differential/Platelet     Status: Abnormal   Collection Time: 01/25/17 12:28 PM  Result Value Ref Range   WBC 7.3 3.8 - 10.6 K/uL   RBC 4.58 4.40 - 5.90 MIL/uL   Hemoglobin 13.9 13.0 - 18.0 g/dL   HCT 16.1 09.6 - 04.5 %   MCV 91.2 80.0 - 100.0 fL   MCH 30.4 26.0 - 34.0 pg   MCHC 33.3 32.0 - 36.0 g/dL   RDW 40.9 (H) 81.1 - 91.4 %   Platelets 158 150 - 440 K/uL   Neutrophils Relative % 61 %   Neutro Abs 4.4 1.4 - 6.5 K/uL   Lymphocytes Relative 25  %   Lymphs Abs 1.8 1.0 - 3.6 K/uL   Monocytes Relative 12 %   Monocytes Absolute 0.9 0.2 - 1.0 K/uL   Eosinophils Relative 1 %   Eosinophils Absolute 0.1 0 - 0.7 K/uL   Basophils Relative 1 %   Basophils Absolute 0.0 0 - 0.1 K/uL  Comprehensive metabolic panel     Status: Abnormal   Collection Time: 01/25/17 12:28 PM  Result Value Ref Range   Sodium 139 135 - 145 mmol/L   Potassium 3.3 (L) 3.5 - 5.1 mmol/L   Chloride 102 101 - 111 mmol/L   CO2 29 22 - 32 mmol/L   Glucose, Bld 121 (H) 65 - 99 mg/dL   BUN 18 6 - 20 mg/dL   Creatinine, Ser 7.82 (L) 0.61 - 1.24 mg/dL   Calcium 9.1 8.9 - 95.6 mg/dL   Total Protein 6.0 (L) 6.5 - 8.1 g/dL  Albumin 3.1 (L) 3.5 - 5.0 g/dL   AST 33 15 - 41 U/L   ALT 20 17 - 63 U/L   Alkaline Phosphatase 46 38 - 126 U/L   Total Bilirubin 1.0 0.3 - 1.2 mg/dL   GFR calc non Af Amer >60 >60 mL/min   GFR calc Af Amer >60 >60 mL/min   Anion gap 8 5 - 15  Troponin I     Status: None   Collection Time: 01/25/17 12:28 PM  Result Value Ref Range   Troponin I <0.03 <0.03 ng/mL   ____________________________________________  EKG My review and personal interpretation at Time: 12:09   Indication: chest pain  Rate: 100  Rhythm: sinus Axis: normal Other: no stemi, normal intervals, non specific st changes ____________________________________________  RADIOLOGY  I personally reviewed all radiographic images ordered to evaluate for the above acute complaints and reviewed radiology reports and findings.  These findings were personally discussed with the patient.  Please see medical record for radiology report.  ____________________________________________   PROCEDURES  Procedure(s) performed:  Procedures    Critical Care performed: no ____________________________________________   INITIAL IMPRESSION / ASSESSMENT AND PLAN / ED COURSE  Pertinent labs & imaging results that were available during my care of the patient were reviewed by me and considered  in my medical decision making (see chart for details).  DDX: ACS, pericarditis, esophagitis, boerhaaves, pe, dissection, pna, bronchitis, costochondritis   Quentin OreRonald League is a 71 y.o. who presents to the ED with chest pain as described above with shortness of breath.  Patient in no acute distress.  Does have a history of COPD and interstitial lung.  EKG shows no evidence of acute ischemia.  Will place on monitor and further evaluation for evidence of ACS.  Description of pain would be atypical for ACS.  CT angiogram will be ordered for PE as the patient is high risk for tachycardia and likely will have elevated d-dimer given his chronic comorbidities.  CT injury and will also further evaluate polyp possible bronchopneumonia on chest x-ray which may simply be his chronic lung disease.  Will give nebulizer treatments.  The patient will be placed on continuous pulse oximetry and telemetry for monitoring.  Laboratory evaluation will be sent to evaluate for the above complaints.    ----------------------------------------- 2:37 PM on 01/25/2017 -----------------------------------------   OBSERVATION CARE: This patient is being placed under observation care for the following reasons: Chest pain with repeat testing to rule out ischemia     Clinical Course as of Jan 25 1541  Sat Jan 25, 2017  1453 CT angiogram with no evidence of PE.  Patchy infiltrate most c/w chronic scarring as he has no fever or leukocytosis.  [PR]  1541 Patient reassessed and remains chest pain free.  Patient willl be signed out to Dr. Scotty CourtStafford pending repeat trop.  Anticipate DC home if trop negative.  Have discussed with the patient and available family all diagnostics and treatments performed thus far and all questions were answered to the best of my ability. The patient demonstrates understanding and agreement with plan.   [PR]    Clinical Course User Index [PR] Willy Eddyobinson, Josslynn Mentzer, MD     ____________________________________________   FINAL CLINICAL IMPRESSION(S) / ED DIAGNOSES  Final diagnoses:  Chest pain, unspecified type      NEW MEDICATIONS STARTED DURING THIS VISIT:  New Prescriptions   No medications on file     Note:  This document was prepared using Dragon voice recognition software and may  include unintentional dictation errors.    Willy Eddy, MD 01/25/17 [phone]

## 2017-01-25 NOTE — ED Notes (Signed)
EMS called for transport back to facility 

## 2017-01-25 NOTE — ED Triage Notes (Signed)
Patient alert and oriented x 4, pleasant, reports chest pain 7/10 left side, aching and stabbing

## 2017-01-25 NOTE — ED Notes (Signed)
Called report to Lidia CollumSandra Carter LPN

## 2017-01-25 NOTE — Discharge Instructions (Addendum)
Your lab tests today were unremarkable and don't show any signs of a heart attack at this time. Follow up with your doctor for continued evaluation of your symptoms. Return to the ER immediately if you have any new or worsening symptoms.

## 2018-02-22 IMAGING — CT CT HEAD W/O CM
4 of 5 series · 15 of 47 positions shown, 17 images · non-contrast
Comparison: CT scan dated 02/16/2012

CLINICAL DATA: Altered mental status.  Seizure.

EXAM:
CT HEAD WITHOUT CONTRAST
TECHNIQUE: Contiguous axial images were obtained from the base of the skull
through the vertex without intravenous contrast.

[Series 3: head wo · axial · 0.43mm/px · z∈[+231,+321]mm · 4 of 32 slices shown]
[im 7/32  brain]
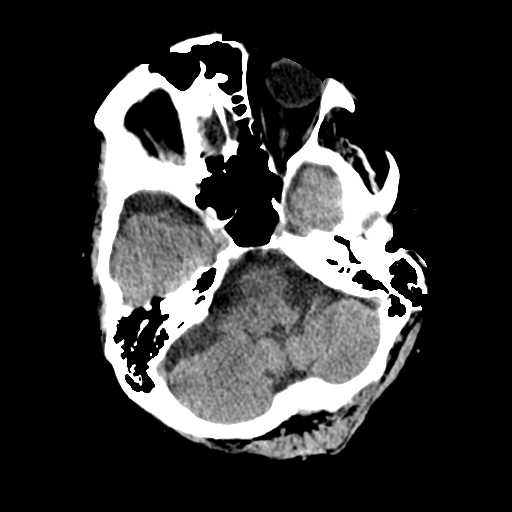
[im 13/32  brain]
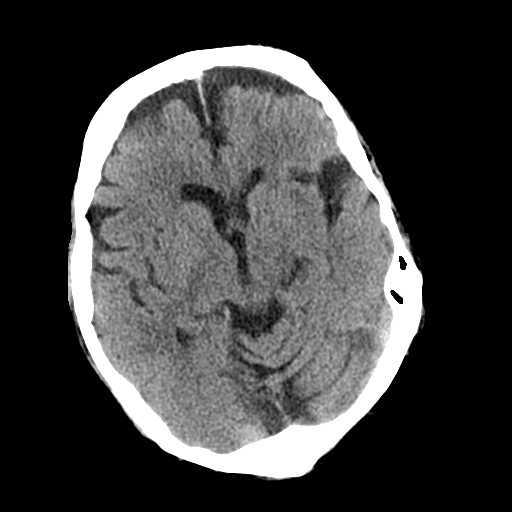
[im 19/32  brain]
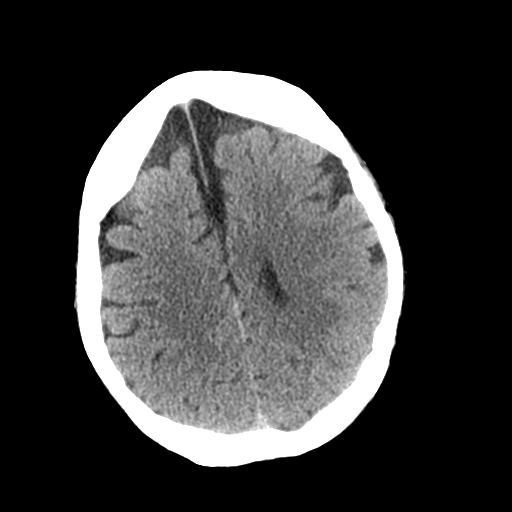
[im 25/32  brain]
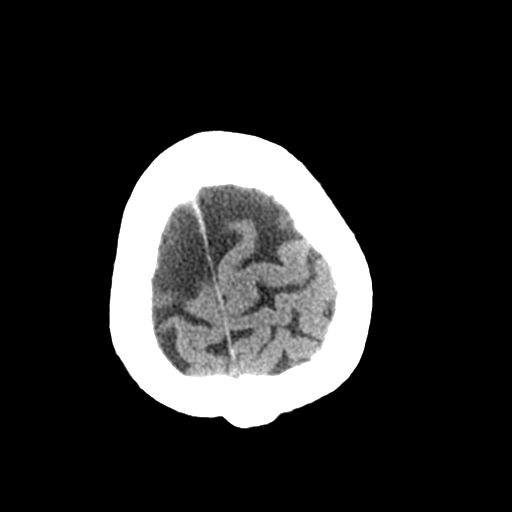

[Series 4: coronal soft tissue · coronal · 0.35mm/px · 3 of 69 slices shown]
[im 23/69  brain]
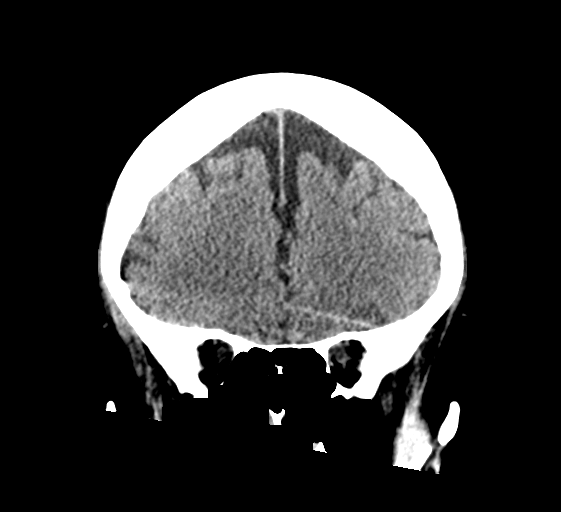
[im 31/69  brain]
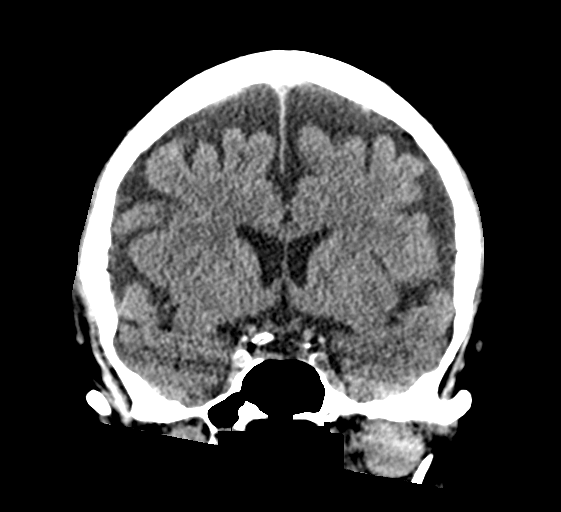
[im 38/69  brain]
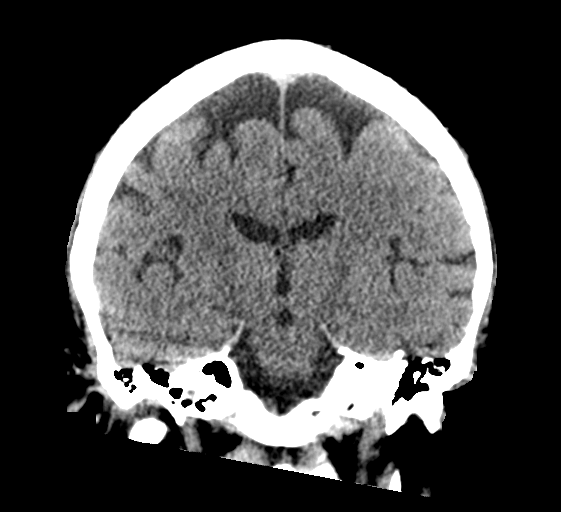

[Series 5: sagittal soft tissue · sagittal · 0.33mm/px · 3 of 53 slices shown]
[im 21/53  brain]
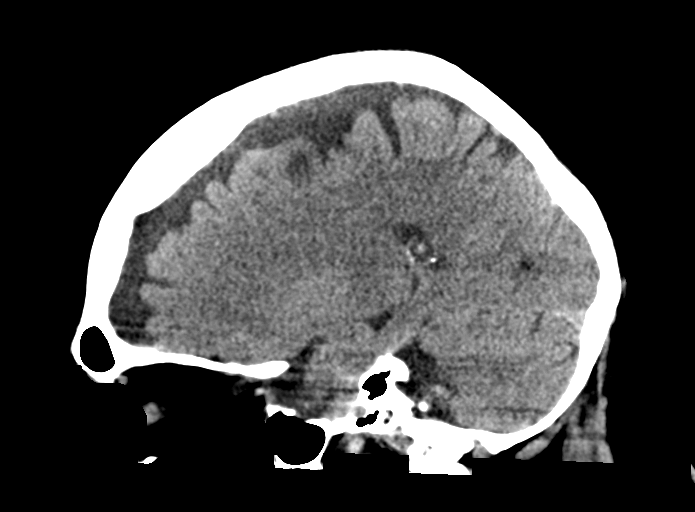
[im 27/53  brain]
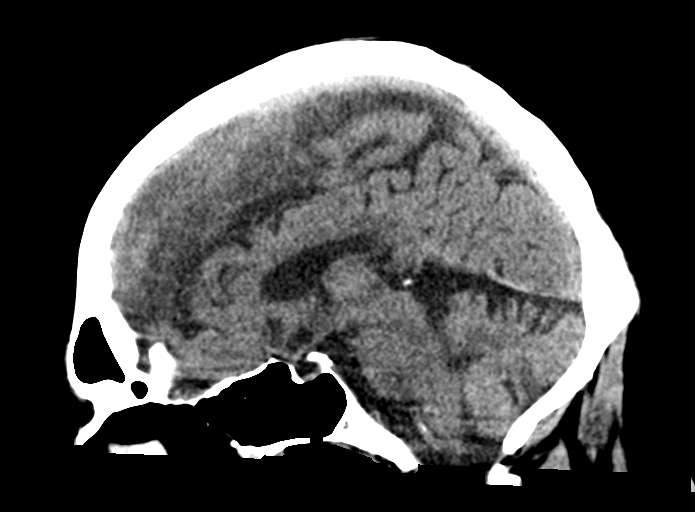
[im 33/53  brain]
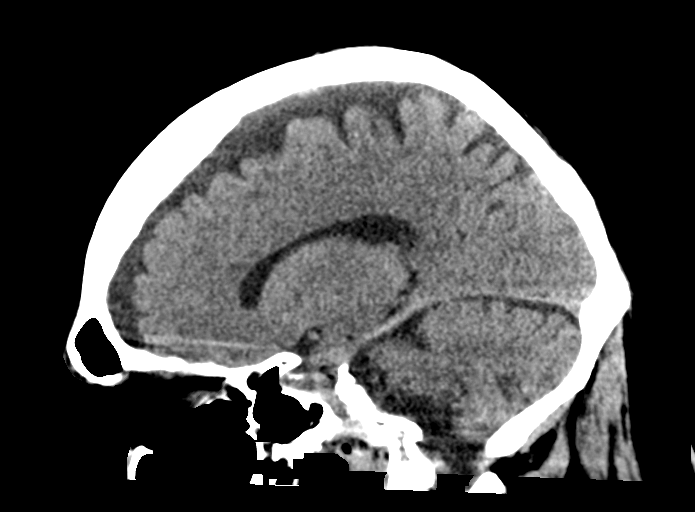

[Series 6: ax head wo recon · axial · 0.39mm/px · z∈[+203,+301]mm · 5 of 32 slices shown, 7 images]
[im 6/32  brain]
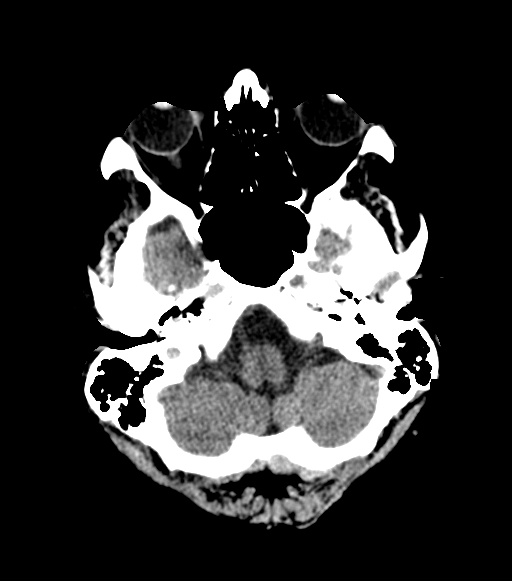
[im 6/32  bone]
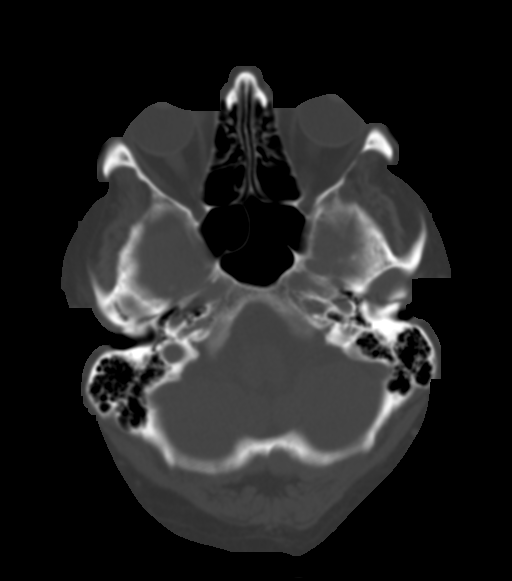
[im 11/32  brain]
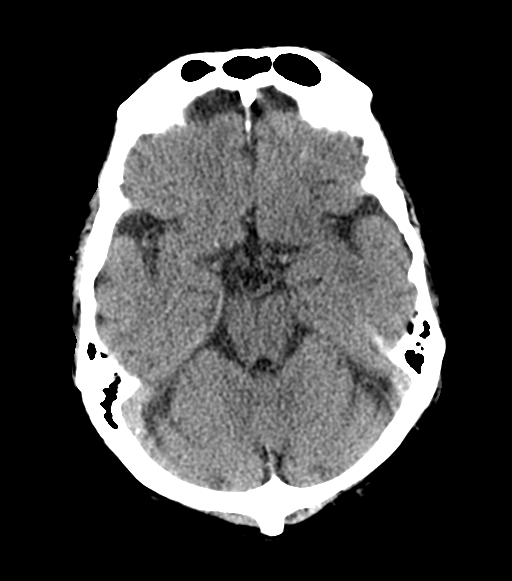
[im 16/32  brain]
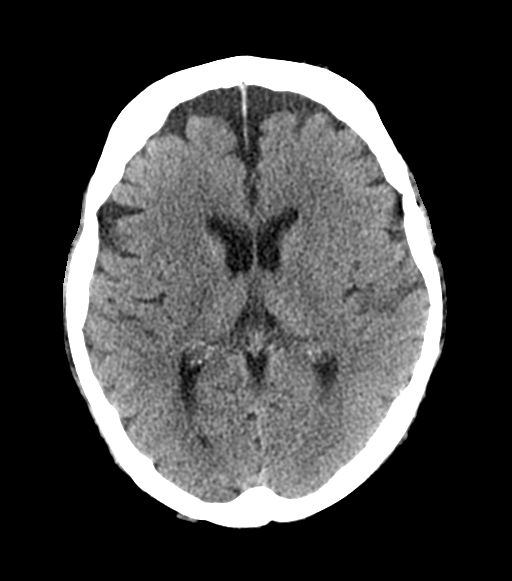
[im 21/32  brain]
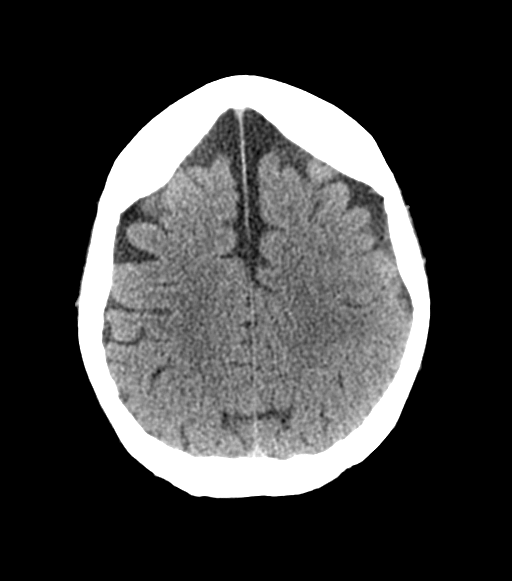
[im 26/32  brain]
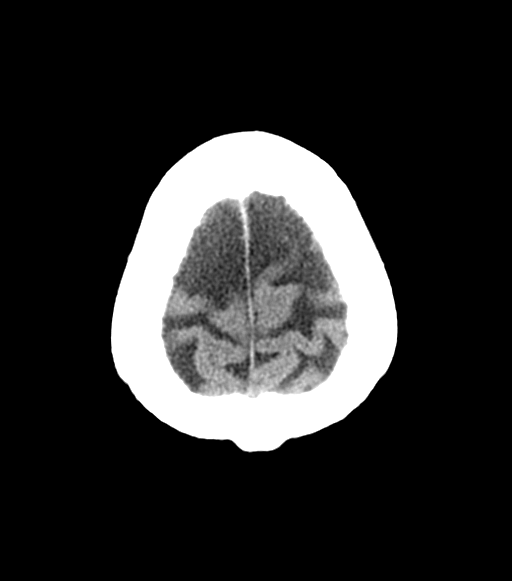
[im 26/32  bone]
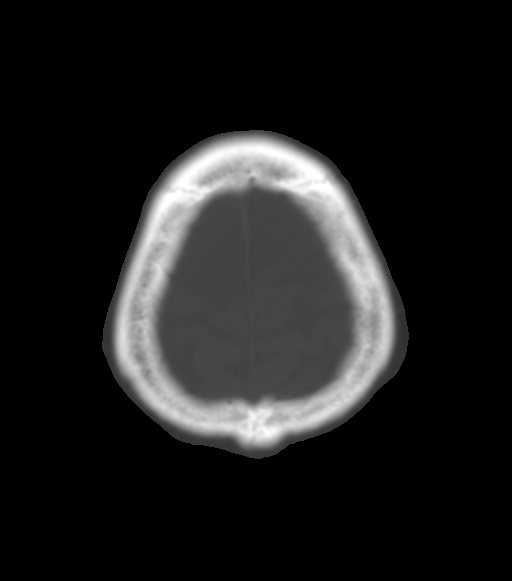

[15 of 47 positions shown; findings below may reference images not displayed]

FINDINGS: Brain: No evidence of acute infarction, hemorrhage, hydrocephalus,
extra-axial collection or mass lesion/mass effect. Diffuse cerebral
cortical and cerebellar atrophy, unchanged since the prior study. No
ventricular dilatation.

Vascular: No hyperdense vessel or unexpected calcification.

Skull: Normal. Negative for fracture or focal lesion.

Sinuses/Orbits: No acute finding.

Other: None.
IMPRESSION: No acute abnormality.  Chronic atrophy, stable.

## 2018-08-06 ENCOUNTER — Emergency Department
Admission: EM | Admit: 2018-08-06 | Discharge: 2018-08-06 | Disposition: A | Discharge status: Other Acute Inpatient Hospital | Payer: Medicare Other | Attending: Emergency Medicine | Admitting: Emergency Medicine

## 2018-08-06 ENCOUNTER — Inpatient Hospital Stay (HOSPITAL_COMMUNITY)
Admission: AD | Admit: 2018-08-06 | Discharge: 2018-08-24 | DRG: 177 | Disposition: E | Payer: Medicare Other | Source: Other Acute Inpatient Hospital | Attending: Internal Medicine | Admitting: Internal Medicine

## 2018-08-06 ENCOUNTER — Other Ambulatory Visit: Payer: Self-pay

## 2018-08-06 ENCOUNTER — Emergency Department: Payer: Medicare Other

## 2018-08-06 DIAGNOSIS — J440 Chronic obstructive pulmonary disease with acute lower respiratory infection: Secondary | ICD-10-CM | POA: Diagnosis present

## 2018-08-06 DIAGNOSIS — Z79890 Hormone replacement therapy (postmenopausal): Secondary | ICD-10-CM

## 2018-08-06 DIAGNOSIS — F250 Schizoaffective disorder, bipolar type: Secondary | ICD-10-CM | POA: Diagnosis not present

## 2018-08-06 DIAGNOSIS — F259 Schizoaffective disorder, unspecified: Secondary | ICD-10-CM | POA: Diagnosis not present

## 2018-08-06 DIAGNOSIS — G9341 Metabolic encephalopathy: Secondary | ICD-10-CM | POA: Diagnosis present

## 2018-08-06 DIAGNOSIS — R569 Unspecified convulsions: Secondary | ICD-10-CM | POA: Diagnosis not present

## 2018-08-06 DIAGNOSIS — J9601 Acute respiratory failure with hypoxia: Secondary | ICD-10-CM

## 2018-08-06 DIAGNOSIS — Z515 Encounter for palliative care: Secondary | ICD-10-CM | POA: Diagnosis not present

## 2018-08-06 DIAGNOSIS — L89152 Pressure ulcer of sacral region, stage 2: Secondary | ICD-10-CM | POA: Diagnosis present

## 2018-08-06 DIAGNOSIS — F419 Anxiety disorder, unspecified: Secondary | ICD-10-CM | POA: Diagnosis not present

## 2018-08-06 DIAGNOSIS — J1289 Other viral pneumonia: Secondary | ICD-10-CM | POA: Diagnosis present

## 2018-08-06 DIAGNOSIS — I959 Hypotension, unspecified: Secondary | ICD-10-CM | POA: Diagnosis not present

## 2018-08-06 DIAGNOSIS — J690 Pneumonitis due to inhalation of food and vomit: Secondary | ICD-10-CM

## 2018-08-06 DIAGNOSIS — Z66 Do not resuscitate: Secondary | ICD-10-CM | POA: Diagnosis present

## 2018-08-06 DIAGNOSIS — Z79899 Other long term (current) drug therapy: Secondary | ICD-10-CM | POA: Insufficient documentation

## 2018-08-06 DIAGNOSIS — J9611 Chronic respiratory failure with hypoxia: Secondary | ICD-10-CM | POA: Diagnosis not present

## 2018-08-06 DIAGNOSIS — Z791 Long term (current) use of non-steroidal anti-inflammatories (NSAID): Secondary | ICD-10-CM | POA: Diagnosis not present

## 2018-08-06 DIAGNOSIS — U071 COVID-19: Principal | ICD-10-CM | POA: Diagnosis present

## 2018-08-06 DIAGNOSIS — J069 Acute upper respiratory infection, unspecified: Secondary | ICD-10-CM

## 2018-08-06 DIAGNOSIS — N400 Enlarged prostate without lower urinary tract symptoms: Secondary | ICD-10-CM | POA: Diagnosis present

## 2018-08-06 DIAGNOSIS — Z9981 Dependence on supplemental oxygen: Secondary | ICD-10-CM

## 2018-08-06 DIAGNOSIS — J9621 Acute and chronic respiratory failure with hypoxia: Secondary | ICD-10-CM | POA: Diagnosis not present

## 2018-08-06 DIAGNOSIS — E039 Hypothyroidism, unspecified: Secondary | ICD-10-CM | POA: Diagnosis present

## 2018-08-06 DIAGNOSIS — J189 Pneumonia, unspecified organism: Secondary | ICD-10-CM | POA: Diagnosis present

## 2018-08-06 DIAGNOSIS — Z7982 Long term (current) use of aspirin: Secondary | ICD-10-CM

## 2018-08-06 DIAGNOSIS — R000 Tachycardia, unspecified: Secondary | ICD-10-CM | POA: Diagnosis not present

## 2018-08-06 DIAGNOSIS — Z801 Family history of malignant neoplasm of trachea, bronchus and lung: Secondary | ICD-10-CM | POA: Diagnosis not present

## 2018-08-06 DIAGNOSIS — F0390 Unspecified dementia without behavioral disturbance: Secondary | ICD-10-CM | POA: Diagnosis present

## 2018-08-06 DIAGNOSIS — J849 Interstitial pulmonary disease, unspecified: Secondary | ICD-10-CM

## 2018-08-06 DIAGNOSIS — J988 Other specified respiratory disorders: Secondary | ICD-10-CM

## 2018-08-06 DIAGNOSIS — Z8051 Family history of malignant neoplasm of kidney: Secondary | ICD-10-CM | POA: Diagnosis not present

## 2018-08-06 DIAGNOSIS — E876 Hypokalemia: Secondary | ICD-10-CM | POA: Diagnosis present

## 2018-08-06 DIAGNOSIS — Z8673 Personal history of transient ischemic attack (TIA), and cerebral infarction without residual deficits: Secondary | ICD-10-CM

## 2018-08-06 DIAGNOSIS — E785 Hyperlipidemia, unspecified: Secondary | ICD-10-CM | POA: Diagnosis not present

## 2018-08-06 DIAGNOSIS — E870 Hyperosmolality and hypernatremia: Secondary | ICD-10-CM | POA: Diagnosis not present

## 2018-08-06 DIAGNOSIS — R0602 Shortness of breath: Secondary | ICD-10-CM

## 2018-08-06 DIAGNOSIS — I679 Cerebrovascular disease, unspecified: Secondary | ICD-10-CM | POA: Diagnosis not present

## 2018-08-06 DIAGNOSIS — F209 Schizophrenia, unspecified: Secondary | ICD-10-CM | POA: Diagnosis present

## 2018-08-06 DIAGNOSIS — L8990 Pressure ulcer of unspecified site, unspecified stage: Secondary | ICD-10-CM

## 2018-08-06 LAB — CBC WITH DIFFERENTIAL/PLATELET
Abs Immature Granulocytes: 0.04 10*3/uL (ref 0.00–0.07)
Basophils Absolute: 0 10*3/uL (ref 0.0–0.1)
Basophils Relative: 0 %
Eosinophils Absolute: 0 10*3/uL (ref 0.0–0.5)
Eosinophils Relative: 1 %
HCT: 40.1 % (ref 39.0–52.0)
Hemoglobin: 12.9 g/dL — ABNORMAL LOW (ref 13.0–17.0)
Immature Granulocytes: 1 %
Lymphocytes Relative: 20 %
Lymphs Abs: 1.1 10*3/uL (ref 0.7–4.0)
MCH: 29.5 pg (ref 26.0–34.0)
MCHC: 32.2 g/dL (ref 30.0–36.0)
MCV: 91.6 fL (ref 80.0–100.0)
Monocytes Absolute: 0.6 10*3/uL (ref 0.1–1.0)
Monocytes Relative: 11 %
Neutro Abs: 3.5 10*3/uL (ref 1.7–7.7)
Neutrophils Relative %: 67 %
Platelets: 200 10*3/uL (ref 150–400)
RBC: 4.38 MIL/uL (ref 4.22–5.81)
RDW: 13.1 % (ref 11.5–15.5)
WBC: 5.2 10*3/uL (ref 4.0–10.5)
nRBC: 0 % (ref 0.0–0.2)

## 2018-08-06 LAB — CREATININE, SERUM
Creatinine, Ser: 0.4 mg/dL — ABNORMAL LOW (ref 0.61–1.24)
GFR calc Af Amer: >60 mL/min
GFR calc non Af Amer: >60 mL/min

## 2018-08-06 LAB — FERRITIN: Ferritin: 685 ng/mL — ABNORMAL HIGH (ref 24–336)

## 2018-08-06 LAB — CBC
HCT: 39.4 % (ref 39.0–52.0)
Hemoglobin: 12.6 g/dL — ABNORMAL LOW (ref 13.0–17.0)
MCH: 30.2 pg (ref 26.0–34.0)
MCHC: 32 g/dL (ref 30.0–36.0)
MCV: 94.5 fL (ref 80.0–100.0)
Platelets: 194 10*3/uL (ref 150–400)
RBC: 4.17 MIL/uL — ABNORMAL LOW (ref 4.22–5.81)
RDW: 13.2 % (ref 11.5–15.5)
WBC: 4 10*3/uL (ref 4.0–10.5)
nRBC: 0 % (ref 0.0–0.2)

## 2018-08-06 LAB — COMPREHENSIVE METABOLIC PANEL
ALT: 14 U/L (ref 0–44)
AST: 23 U/L (ref 15–41)
Albumin: 3.2 g/dL — ABNORMAL LOW (ref 3.5–5.0)
Alkaline Phosphatase: 54 U/L (ref 38–126)
Anion gap: 12 (ref 5–15)
BUN: 11 mg/dL (ref 8–23)
CO2: 28 mmol/L (ref 22–32)
Calcium: 8.5 mg/dL — ABNORMAL LOW (ref 8.9–10.3)
Chloride: 100 mmol/L (ref 98–111)
Creatinine, Ser: 0.38 mg/dL — ABNORMAL LOW (ref 0.61–1.24)
GFR calc Af Amer: >60 mL/min (ref >60)
GFR calc non Af Amer: >60 mL/min (ref >60)
Glucose, Bld: 90 mg/dL (ref 70–99)
Potassium: 3.3 mmol/L — ABNORMAL LOW (ref 3.5–5.1)
Sodium: 140 mmol/L (ref 135–145)
Total Bilirubin: 1 mg/dL (ref 0.3–1.2)
Total Protein: 6.4 g/dL — ABNORMAL LOW (ref 6.5–8.1)

## 2018-08-06 LAB — LACTIC ACID, PLASMA: Lactic Acid, Venous: 0.7 mmol/L (ref 0.5–1.9)

## 2018-08-06 LAB — LACTATE DEHYDROGENASE: LDH: 152 U/L (ref 98–192)

## 2018-08-06 LAB — C-REACTIVE PROTEIN: CRP: 8 mg/dL — ABNORMAL HIGH (ref <1.0)

## 2018-08-06 LAB — FIBRINOGEN: Fibrinogen: 593 mg/dL — ABNORMAL HIGH (ref 210–475)

## 2018-08-06 LAB — SARS CORONAVIRUS 2 BY RT PCR (HOSPITAL ORDER, PERFORMED IN ~~LOC~~ HOSPITAL LAB): SARS Coronavirus 2: POSITIVE — AB

## 2018-08-06 LAB — ABO/RH: ABO/RH(D): B POS

## 2018-08-06 LAB — FIBRIN DERIVATIVES D-DIMER (ARMC ONLY): Fibrin derivatives D-dimer (ARMC): 1336.32 ng/mL (FEU) — ABNORMAL HIGH (ref 0.00–499.00)

## 2018-08-06 LAB — PROCALCITONIN: Procalcitonin: <0.1 ng/mL

## 2018-08-06 MED ORDER — SODIUM CHLORIDE 0.9 % IV SOLN
2.0000 g | Freq: Once | INTRAVENOUS | Status: AC
  Administered 2018-08-06: 13:00:00 2 g via INTRAVENOUS
  Filled 2018-08-06: qty 2

## 2018-08-06 MED ORDER — ASPIRIN EC 81 MG PO TBEC
81.0000 mg | DELAYED_RELEASE_TABLET | Freq: Every day | ORAL | Status: DC
  Administered 2018-08-06 – 2018-08-11 (×6): 81 mg via ORAL
  Filled 2018-08-06 (×7): qty 1

## 2018-08-06 MED ORDER — METHYLPREDNISOLONE SODIUM SUCC 125 MG IJ SOLR
40.0000 mg | Freq: Three times a day (TID) | INTRAMUSCULAR | Status: DC
  Administered 2018-08-06 – 2018-08-08 (×5): 40 mg via INTRAVENOUS
  Filled 2018-08-06 (×5): qty 2

## 2018-08-06 MED ORDER — ATORVASTATIN CALCIUM 10 MG PO TABS
20.0000 mg | ORAL_TABLET | Freq: Every day | ORAL | Status: DC
  Administered 2018-08-06 – 2018-08-11 (×6): 20 mg via ORAL
  Filled 2018-08-06 (×7): qty 2

## 2018-08-06 MED ORDER — SODIUM CHLORIDE 0.9 % IV SOLN
500.0000 mg | INTRAVENOUS | Status: DC
  Administered 2018-08-07 (×2): 500 mg via INTRAVENOUS
  Filled 2018-08-06 (×3): qty 500

## 2018-08-06 MED ORDER — VANCOMYCIN HCL IN DEXTROSE 1-5 GM/200ML-% IV SOLN
1000.0000 mg | Freq: Once | INTRAVENOUS | Status: AC
  Administered 2018-08-06: 1000 mg via INTRAVENOUS
  Filled 2018-08-06: qty 200

## 2018-08-06 MED ORDER — IPRATROPIUM-ALBUTEROL 20-100 MCG/ACT IN AERS
1.0000 | INHALATION_SPRAY | Freq: Four times a day (QID) | RESPIRATORY_TRACT | Status: DC
  Administered 2018-08-06 – 2018-08-10 (×15): 1 via RESPIRATORY_TRACT
  Filled 2018-08-06: qty 4

## 2018-08-06 MED ORDER — SODIUM CHLORIDE 0.9 % IV BOLUS
1000.0000 mL | Freq: Once | INTRAVENOUS | Status: AC
  Administered 2018-08-06: 15:00:00 1000 mL via INTRAVENOUS

## 2018-08-06 MED ORDER — TAMSULOSIN HCL 0.4 MG PO CAPS
0.4000 mg | ORAL_CAPSULE | Freq: Every day | ORAL | Status: DC
  Administered 2018-08-06 – 2018-08-16 (×11): 0.4 mg via ORAL
  Filled 2018-08-06 (×11): qty 1

## 2018-08-06 MED ORDER — MOMETASONE FURO-FORMOTEROL FUM 100-5 MCG/ACT IN AERO
2.0000 | INHALATION_SPRAY | Freq: Two times a day (BID) | RESPIRATORY_TRACT | Status: DC
  Administered 2018-08-06 – 2018-08-16 (×19): 2 via RESPIRATORY_TRACT
  Filled 2018-08-06: qty 8.8

## 2018-08-06 MED ORDER — LEVOTHYROXINE SODIUM 50 MCG PO TABS
50.0000 ug | ORAL_TABLET | Freq: Every day | ORAL | Status: DC
  Administered 2018-08-07 – 2018-08-12 (×6): 50 ug via ORAL
  Filled 2018-08-06 (×6): qty 1

## 2018-08-06 MED ORDER — ACETAMINOPHEN 325 MG PO TABS
650.0000 mg | ORAL_TABLET | Freq: Four times a day (QID) | ORAL | Status: DC | PRN
  Administered 2018-08-07 – 2018-08-11 (×7): 650 mg via ORAL
  Filled 2018-08-06 (×7): qty 2

## 2018-08-06 MED ORDER — TRAMADOL HCL 50 MG PO TABS
50.0000 mg | ORAL_TABLET | Freq: Once | ORAL | Status: AC
  Administered 2018-08-06: 14:00:00 50 mg via ORAL
  Filled 2018-08-06: qty 1

## 2018-08-06 MED ORDER — LORAZEPAM 2 MG/ML IJ SOLN
INTRAMUSCULAR | Status: AC
  Administered 2018-08-06: 15:00:00 1 mg via INTRAVENOUS
  Filled 2018-08-06: qty 1

## 2018-08-06 MED ORDER — LORAZEPAM 2 MG/ML IJ SOLN
1.0000 mg | Freq: Once | INTRAMUSCULAR | Status: AC
  Administered 2018-08-06: 15:00:00 1 mg via INTRAVENOUS

## 2018-08-06 MED ORDER — ENOXAPARIN SODIUM 40 MG/0.4ML ~~LOC~~ SOLN
40.0000 mg | SUBCUTANEOUS | Status: DC
  Administered 2018-08-06 – 2018-08-16 (×11): 40 mg via SUBCUTANEOUS
  Filled 2018-08-06 (×11): qty 0.4

## 2018-08-06 MED ORDER — TOCILIZUMAB 400 MG/20ML IV SOLN
8.0000 mg/kg | Freq: Once | INTRAVENOUS | Status: AC
  Administered 2018-08-06: 23:00:00 702 mg via INTRAVENOUS
  Filled 2018-08-06: qty 35.1

## 2018-08-06 MED ORDER — DIVALPROEX SODIUM 500 MG PO DR TAB
500.0000 mg | DELAYED_RELEASE_TABLET | Freq: Every day | ORAL | Status: DC
  Administered 2018-08-06 – 2018-08-16 (×11): 500 mg via ORAL
  Filled 2018-08-06 (×11): qty 1

## 2018-08-06 MED ORDER — VITAMIN C 500 MG PO TABS
500.0000 mg | ORAL_TABLET | Freq: Every day | ORAL | Status: DC
  Administered 2018-08-06 – 2018-08-11 (×6): 500 mg via ORAL
  Filled 2018-08-06 (×7): qty 1

## 2018-08-06 MED ORDER — ZINC SULFATE 220 (50 ZN) MG PO CAPS
220.0000 mg | ORAL_CAPSULE | Freq: Every day | ORAL | Status: DC
  Administered 2018-08-06 – 2018-08-11 (×6): 220 mg via ORAL
  Filled 2018-08-06 (×7): qty 1

## 2018-08-06 MED ORDER — MYCOPHENOLATE MOFETIL 250 MG PO CAPS
1500.0000 mg | ORAL_CAPSULE | Freq: Two times a day (BID) | ORAL | Status: DC
  Administered 2018-08-06 – 2018-08-16 (×21): 1500 mg via ORAL
  Filled 2018-08-06 (×23): qty 6

## 2018-08-06 NOTE — ED Provider Notes (Signed)
[name] Emergency Department Provider Note    ____________________________________________   I have reviewed the triage vital signs and the nursing notes.   HISTORY  Chief Complaint Psychiatric Evaluation   HPI Mason May is a 73 y.o. male who presents to the emergency department today because of concerns for psychiatric illness. Patient is coming from living facility. The patient states he feels like he is going crazy. He does have a history of schizophrenia. The patient however also states that he has a history of pneumonia. Patient's roommate recently tested positive for COVID 19.   Records reviewed. Per medical record review patient has a history of schizophrenia. History of pneumonia.   Past Medical History:  Diagnosis Date  . Cerebral vascular disease   . Dementia   . Schizo affective schizophrenia (HCC)   . Seizures [facility](HCC)     Patient Active Problem List   Diagnosis Date Noted  . Chronic respiratory failure with hypoxia (HCC) 03/31/2016  . Pneumonia 03/31/2016  . Hypothyroidism 03/31/2016  . Interstitial lung disease (HCC) 03/31/2016  . Unresponsive episode   . Schizo affective schizophrenia (HCC) 03/27/2016  . Sepsis (HCC) 03/26/2016  . Seizure (HCC) 03/26/2016    No past surgical history on file.  Prior to Admission medications   Medication Sig Start Date End Date Taking? Authorizing Provider  alfuzosin (UROXATRAL) 10 MG 24 hr tablet Take 10 mg by mouth daily with breakfast.    [provider]  atorvastatin (LIPITOR) 40 MG tablet Take 40 mg by mouth daily.    [provider]  benzonatate (TESSALON) 100 MG capsule Take 100 mg by mouth 3 (three) times daily as needed for cough.    [provider]  budesonide-formoterol (SYMBICORT) 160-4.5 MCG/ACT inhaler Inhale 2 puffs into the lungs 2 (two) times daily.    [provider]  divalproex (DEPAKOTE) 500 MG DR tablet Take 500 mg by mouth 2 (two) times  daily.    [provider]  docusate sodium (COLACE) 100 MG capsule Take 1 capsule (100 mg total) by mouth 2 (two) times daily. 04/01/16   [name], [name], MD  DULoxetine (CYMBALTA) 30 MG capsule Take 30 mg by mouth daily.    [provider]  hydrOXYzine (VISTARIL) 25 MG capsule Take 1 capsule (25 mg total) by mouth 3 (three) times daily as needed for anxiety. 01/25/17   [name], [name], MD  levofloxacin (LEVAQUIN) 500 MG tablet Take 1 tablet (500 mg total) by mouth daily. Patient not taking: Reported on 2019-02-05 04/01/16   [name], [name], MD  levothyroxine (SYNTHROID, LEVOTHROID) 50 MCG tablet Take 50 mcg by mouth daily before breakfast.    [provider]  mycophenolate (CELLCEPT) 250 MG capsule Take 6 capsules (1,500 mg total) by mouth 2 (two) times daily. 04/01/16   [name], [name], MD  nystatin (MYCOSTATIN/NYSTOP) powder Apply topically 3 (three) times daily.    [provider]  omeprazole (PRILOSEC) 40 MG capsule Take 40 mg by mouth daily.    [provider]  predniSONE (DELTASONE) 10 MG tablet Take 10 mg by mouth daily.    [provider]  risperiDONE (RISPERDAL) 2 MG tablet Take 2 mg by mouth 2 (two) times daily.    [provider]  rOPINIRole (REQUIP) 1 MG tablet Take 1 tablet (1 mg total) by mouth 2 (two) times daily. 04/01/16   [name], [name], MD  solifenacin (VESICARE) 10 MG tablet Take 10 mg by mouth daily.    [provider]  traMADol [name](ULTRAM)  50 MG tablet Take 50 mg by mouth every 6 (six) hours as needed for severe pain.    [provider]    Allergies Patient has no known allergies.  Family History  Problem Relation Age of Onset  . Kidney cancer Mother   . Lung cancer Father     Social History Social History   Tobacco Use  . Smoking status: Never Smoker  . Smokeless tobacco: Never Used  Substance Use Topics  . Alcohol use: No  . Drug use: No    Review of Systems Constitutional: No  fever Eyes: No visual changes. ENT: No sore throat. Cardiovascular: Denies chest pain. Respiratory: Positive for shortness of breath. Gastrointestinal: No abdominal pain.  No nausea, no vomiting.  No diarrhea.   Genitourinary: Negative for dysuria. Musculoskeletal: Negative for back pain. Skin: Negative for rash. Neurological: Negative for headaches, focal weakness or numbness.  ____________________________________________   PHYSICAL EXAM:  VITAL SIGNS: ED Triage Vitals  Enc Vitals Group     BP Aug 23, 2018 1211 126/88     Pulse Rate 08/23/2018 1211 (!) 110     Resp 2018/08/23 1211 18     Temp 2018/08/23 1212 99.3 F (37.4 C)     Temp Source 23-Aug-2018 1212 Axillary     SpO2 08/23/18 1211 98 %     Weight 23-Aug-2018 1211 186 lb (84.4 kg)     Height 2018/08/23 1211 6' (1.829 m)     Head Circumference --      Peak Flow --      Pain Score 08-23-2018 1211 0    Constitutional: Alert and oriented.  Eyes: Conjunctivae are normal.  ENT      Head: Normocephalic and atraumatic.      Nose: No congestion/rhinnorhea.      Mouth/Throat: Mucous membranes are moist.      Neck: No stridor. Hematological/Lymphatic/Immunilogical: No cervical lymphadenopathy. Cardiovascular: Tachycardia, regular rhythm.  No murmurs, rubs, or gallops.  Respiratory: Tachypnea. Decreased breath sounds lower lungs.  Gastrointestinal: Soft and non tender. No rebound. No guarding.  Genitourinary: Deferred Musculoskeletal: Normal range of motion in all extremities. No lower extremity edema. Neurologic:  Normal speech and language. No gross focal neurologic deficits are appreciated.  Skin:  Skin is warm, dry and intact. No rash noted. Psychiatric: Mood and affect are normal. Speech and behavior are normal. Patient exhibits appropriate insight and judgment.  ____________________________________________    LABS (pertinent positives/negatives)  COVID positive CBC wbc 5.2, hgb 12.9, plt 200 CMP k 3.3, cr  0.38  ____________________________________________   EKG  I, [name], attending physician, personally viewed and interpreted this EKG  EKG Time: 1209 Rate: 109 Rhythm: sinus tachycardia Axis: normal Intervals: qtc 480 QRS: narrow, q waves v1 ST changes: no st elevation Impression: abnormal ekg   ____________________________________________    RADIOLOGY  CXR Concern for bilateral infiltrates  ____________________________________________   PROCEDURES  Procedures  ____________________________________________   INITIAL IMPRESSION / ASSESSMENT AND PLAN / ED COURSE  Pertinent labs & imaging results that were available during my care of the patient were reviewed by me and considered in my medical decision making (see chart for details).   Patient presented to the emergency department today because of concerns for feeling unwell and concern for possible psychiatric illness.  However initial vital signs were concerning for tachycardia, temperature of 100.2 and some tachypnea.  Chest x-ray was concerning for bilateral infiltrates and patient was given broad-spectrum antibiotics.  Patient was tested for COVID given that he came from  a high risk facility.  This was positive.  Will plan on admission and transfer to [facility] the COVID center.  ____________________________________________   FINAL CLINICAL IMPRESSION(S) / ED DIAGNOSES  Final diagnoses:  COVID-19  Shortness of breath  Tachycardia     Note: This dictation was prepared with Dragon dictation. Any transcriptional errors that result from this process are unintentional     [name], MD 08/21/2018 [phone]

## 2018-08-06 NOTE — H&P (Signed)
TRH H&P   Patient Demographics:    Mason May, is a 73 y.o. male  MRN: [id number]   DOB - 10/02/1945  Admit Date - 08/14/2018  Outpatient Primary MD for the patient is Lance Bosch, MD  Referring MD/NP/PA: [facility] ED dr Derrill Kay  Patient coming from: [facility]  No chief complaint on file.     HPI:    Mason May  is a 73 y.o. male, with past medical history of CVA, dementia, seizures, schizophrenia, interstitial lung disease on CellCept, COPD, baseline on 4 L nasal cannula, patient is a resident at [facility], [place], which has COVID-19 outbreak, was sent to [facility] emergency department because of concern of psychiatric illness, patient reports he is feeling going crazy, does have a history of schizophrenia, patient was noted to be febrile 100.2, tachycardic, at baseline cannula, he was tested for COVID-19 which came back positive, so admission was requested to [facility].  Patient Is currently confused, cannot provide appropriate history, further history obtained from sister, apparently he has been febrile facility fever up to 104, he has been feeling sick over the last 10 days, but he tested twice negative for COVID-19. In ED patient was febrile at 100.2, tachycardic, elevated CRP at 8, chest x-ray significant for bilateral patency, COVID-19 positive, baseline 4 L cannula, procalcitonin within normal limit, I was called to accept transfer to [facility] for management of his COVID-19.    Review of systems:      Patient is confused, cannot provide reliable review of system   With Past History of the following :    Past Medical History:  Diagnosis Date  . Cerebral vascular disease   . Dementia   . Schizo affective schizophrenia (HCC)   . Seizures (HCC)       No past surgical history on file.    Social History:     Social  History   Tobacco Use  . Smoking status: Never Smoker  . Smokeless tobacco: Never Used  Substance Use Topics  . Alcohol use: No     Lives -SNF     Family History :     Family History  Problem Relation Age of Onset  . Kidney cancer Mother   . Lung cancer Father      Home Medications:   Prior to Admission medications   Medication Sig Start Date End Date Taking? Authorizing Provider  ALPRAZolam Prudy Feeler) 0.5 MG tablet Take 0.5 mg by mouth 3 (three) times daily as needed for anxiety.   Yes [provider]  aspirin EC 81 MG tablet Take 81 mg by mouth daily.   Yes [provider]  atorvastatin (LIPITOR) 20 MG tablet Take 20 mg by mouth daily.    Yes [provider]  baclofen (LIORESAL) 10 MG tablet Take 10 mg by mouth at bedtime.   Yes [provider]  divalproex (DEPAKOTE) 500 MG DR tablet Take 500 mg by mouth at bedtime.    Yes [provider]  Fluticasone-Salmeterol (ADVAIR) 100-50 MCG/DOSE AEPB Inhale 1 puff into the lungs 2 (two) times daily.   Yes [provider]  lactulose, encephalopathy, (CHRONULAC) 10 GM/15ML SOLN Take 10 g by mouth daily as needed (constipation).   Yes [provider]  levothyroxine (SYNTHROID, LEVOTHROID) 50 MCG tablet Take 50 mcg by mouth daily before breakfast.   Yes [provider]  meloxicam (MOBIC) 7.5 MG tablet Take 7.5 mg by mouth daily.   Yes [provider]  mycophenolate (CELLCEPT) 250 MG capsule Take 6 capsules (1,500 mg total) by mouth 2 (two) times daily. 04/01/16  Yes Katharina Caper, MD  OLANZapine (ZYPREXA) 5 MG tablet Take 5 mg by mouth at bedtime.   Yes [provider]  paliperidone (INVEGA) 6 MG 24 hr tablet Take 12 mg by mouth every morning.   Yes [provider]  polyethylene glycol (MIRALAX / GLYCOLAX) 17 g packet Take 17 g by mouth daily.   Yes [provider]  QUEtiapine (SEROQUEL) 50 MG tablet Take 50 mg by mouth 2 (two) times  daily.   Yes [provider]  senna (SENOKOT) 8.6 MG TABS tablet Take 2 tablets by mouth at bedtime.   Yes [provider]  tamsulosin (FLOMAX) 0.4 MG CAPS capsule Take 0.4 mg by mouth.   Yes [provider]  traMADol (ULTRAM) 50 MG tablet Take 50 mg by mouth every 8 (eight) hours.    Yes [provider]  traZODone (DESYREL) 50 MG tablet Take 50 mg by mouth at bedtime.   Yes [provider]  Valbenazine Tosylate (INGREZZA) 40 MG CAPS Take 40 mg by mouth daily.   Yes [provider]  levofloxacin (LEVAQUIN) 500 MG tablet Take 1 tablet (500 mg total) by mouth daily. Patient not taking: Reported on 08/07/18 04/01/16   Katharina Caper, MD     Allergies:    No Known Allergies   Physical Exam:   Vitals  Height 6' (1.829 m), weight 87.6 kg.   1. General frail, chronically ill-appearing male, laying in bed, in mild comfort  2.  Is confused, demented altered, impaired judgment and insight   3. No F.N deficits, ALL C.Nerves Intact, Strength 5/5 all 4 extremities, Sensation intact all 4 extremities, Plantars down going.  4. Ears and Eyes appear Normal, Conjunctivae clear, PERRLA. Moist Oral Mucosa.  5. Supple Neck, No JVD, No cervical lymphadenopathy appriciated, No Carotid Bruits.  6. Symmetrical Chest wall movement, Good air movement bilaterally, CTAB.  7. RRR, No Gallops, Rubs or Murmurs, No Parasternal Heave.  8. Positive Bowel Sounds, Abdomen Soft, No tenderness, No organomegaly appriciated,No rebound -guarding or rigidity.  9.  No Cyanosis, Normal Skin Turgor, has sacral pressure ulcer stage II, patient has left  second toe discoloration  10. Good muscle tone,  joints appear normal , no effusions, Normal ROM.  11. No Palpable Lymph Nodes in Neck or Axillae    Data Review:    CBC Recent Labs  Lab 07-Aug-2018 1223  WBC 5.2  HGB 12.9*  HCT 40.1  PLT 200  MCV 91.6  MCH 29.5  MCHC 32.2  RDW 13.1  LYMPHSABS 1.1  MONOABS  0.6  EOSABS 0.0  BASOSABS 0.0   ------------------------------------------------------------------------------------------------------------------  Chemistries  Recent Labs  Lab Aug 07, 2018 1223  NA 140  K 3.3*  CL 100  CO2 28  GLUCOSE 90  BUN 11  CREATININE 0.38*  CALCIUM 8.5*  AST 23  ALT 14  ALKPHOS 54  BILITOT 1.0   ------------------------------------------------------------------------------------------------------------------ estimated creatinine clearance is 94.3 mL/min (A) (by C-G formula based on SCr of 0.38 mg/dL (L)). ------------------------------------------------------------------------------------------------------------------ No results for input(s): TSH, T4TOTAL, T3FREE, THYROIDAB in the last 72 hours.  Invalid input(s): FREET3  Coagulation profile No results for input(s): INR, PROTIME in the last 168 hours. ------------------------------------------------------------------------------------------------------------------- No results for input(s): DDIMER in the last 72 hours. -------------------------------------------------------------------------------------------------------------------  Cardiac Enzymes No results for input(s): CKMB, TROPONINI, MYOGLOBIN in the last 168 hours.  Invalid input(s): CK ------------------------------------------------------------------------------------------------------------------    Component Value Date/Time   BNP 11.0 01/25/2017 1221     ---------------------------------------------------------------------------------------------------------------  Urinalysis    Component Value Date/Time   COLORURINE YELLOW (A) 03/26/2016 1120   APPEARANCEUR CLEAR (A) 03/26/2016 1120   LABSPEC 1.024 03/26/2016 1120   PHURINE 5.0 03/26/2016 1120   GLUCOSEU NEGATIVE 03/26/2016 1120   HGBUR MODERATE (A) 03/26/2016 1120   BILIRUBINUR NEGATIVE 03/26/2016 1120   KETONESUR 80 (A) 03/26/2016 1120   PROTEINUR 100 (A) 03/26/2016 1120    NITRITE NEGATIVE 03/26/2016 1120   LEUKOCYTESUR NEGATIVE 03/26/2016 1120    ----------------------------------------------------------------------------------------------------------------   Imaging Results:    Dg Chest Port 1 View  Result Date: 2019/02/04 CLINICAL DATA:  Fever.  COVID-19 exposure EXAM: PORTABLE CHEST 1 VIEW COMPARISON:  01/25/2017 and CT chest 01/25/2018 FINDINGS: Chronic lung disease with scarring based on prior CT. Superimposed patchy airspace disease bilaterally is new compared with the prior study. No effusion. IMPRESSION: COPD with scarring Superimposed acute airspace disease bilaterally suspicious for pneumonia. Electronically Signed   By: Marlan Palauharles  Clark M.D.   On: 02020/11/12 12:57    My personal review of EKG: Rhythm NSR, Rate  109 /min, QTc 480 , no Acute ST changes   Assessment & Plan:    Active Problems:   Seizure (HCC)   Schizo affective schizophrenia (HCC)   Chronic respiratory failure with hypoxia (HCC)   Pneumonia   Hypothyroidism   Interstitial lung disease (HCC)   COVID-19 virus infection   COVID-19   Covid 19 infection -Patient was febrile, tachycardic upon presentation to ED [place] oxygen requirement, but CRP significantly elevated at 8, this x-ray significant for bilateral opacities, his procalcitonin within normal limit, so no indication infection, I will hold (, already received broad-spectrum coverage and [facility] ED). -Continue to follow inflammatory markers closely, will trend CR ferritin, D-dimers as well,  patient tenuous respiratory status, bilateral opacities, some increased work of breathing, CRP, I will start him on steroids, and given Actemra, this was discussed at length with his sister, she is aware he is already on immunosuppression with CellCept, and this is adding another immunosuppressant, which increase his chances of opportunistic infection, but as I have discussed with pulmonary 1 dose of Actemra did not cause profound  immunosuppression, increased chance of infection especially with normal procalcitonin. The treatment plan and use of medications and known side effects were discussed with patient/family(sister) they were clearly explained that there is no proven definitive treatment for COVID-19 infection, any medications used here are based on published clinical articles/anecdotal data which are not peer-reviewed or randomized control trials.  Complete risks and long-term side effects are unknown, however in the best clinical judgment they seem to be of some clinical benefit rather than medical risks.  Patient/family agree with the treatment plan and want to receive the given medications. -Patient with history of COPD/tissue lung disease lung disease( on Cellicept)  and  chronic respiratory failure on 4 L nasal cannula at baseline, he is at the very high risk for decompensation, he will need close monitoring.  Interstitial lung disease -Continue with CellCept, will need close monitoring in the setting of COVID-19 infection  Acute metabolic encephalopathy -This is most likely in the setting of infectious process, possibly related to pharmacy setting of multiple, he has no focal deficits  COPD and chronic hypoxic respiratory failure -At baseline, on 4 L nasal cannula, continue with home medication including Advair, Combivent as well.  Schizophrenia and schizoaffective disorder,/hallucination -Continue with home medication after medication and consolidation, (previous documentation include include Xanax, Depakote, Zyprexa, Seroquel, Invega, Ingrezza and trazodone, but this has not been reconciled yet)  History of seizures -Continue with home medications including  Hypothyroidism -Continue with home dose levothyroxine  Hyperlipidemia -Continue with statin  BPH -Continue with Flomax  Stage 2 sacral pressure ulcer  DVT Prophylaxis  Lovenox - SCDs  AM Labs Ordered, also please review Full Orders  Family  Communication: Admission, patients condition and plan of care including tests being ordered have been discussed with the sister twho indicate understanding and agree with the plan and Code Status.  Code Status DNR, this was discussed with his sister, who reported patient was at the facility preparing DNR paperwork  Likely DC to  [facility]  Condition GUARDED    Consults called: none  Admission status: Inpatient, given his COVID 19+ patient, with his tenuous respiratory status respiratory status, on 4 L nasal cannula at baseline with COPD, will need close monitoring.  Time spent in minutes : 65 minutes   Huey Bienenstock M.D on 07/26/2018 at 7:32 PM  Between 7am to 7pm - Pager - [phone]. After 7pm go to [email or web address] - password [facility]  [facility] - Office  [phone]

## 2018-08-06 NOTE — ED Triage Notes (Signed)
PT arrives via ems from [facility] with concerns over psychiatric state; pt states "I feel like I am going crazy." HX of schizophrenia. Compliance with medication reported. PT reports he started to feel this way today. Pt's room mate tested positive for covid-19. No acute upper respiratory symptoms reported; pt wears 4L via nasal canula at baseline.

## 2018-08-06 NOTE — Progress Notes (Signed)
CODE SEPSIS - PHARMACY COMMUNICATION  **Broad Spectrum Antibiotics should be administered within 1 hour of Sepsis diagnosis**  Time Code Sepsis Called/Page Received: 12:30  Antibiotics Ordered: Vancomycin and Cefepime  Time of 1st antibiotic administration: Cefepime given at 13:19  Additional action taken by pharmacy: N/A  If necessary, Name of Provider/Nurse Contacted: N/A    Foye Deer ,PharmD Clinical Pharmacist  07/26/2018  1:26 PM

## 2018-08-06 NOTE — ED Notes (Signed)
emtala reviewed by charge RN 

## 2018-08-07 LAB — COMPREHENSIVE METABOLIC PANEL
ALT: 15 U/L (ref 0–44)
AST: 24 U/L (ref 15–41)
Albumin: 3.1 g/dL — ABNORMAL LOW (ref 3.5–5.0)
Alkaline Phosphatase: 48 U/L (ref 38–126)
Anion gap: 10 (ref 5–15)
BUN: 9 mg/dL (ref 8–23)
CO2: 29 mmol/L (ref 22–32)
Calcium: 8.4 mg/dL — ABNORMAL LOW (ref 8.9–10.3)
Chloride: 101 mmol/L (ref 98–111)
Creatinine, Ser: 0.31 mg/dL — ABNORMAL LOW (ref 0.61–1.24)
GFR calc Af Amer: >60 mL/min (ref >60)
GFR calc non Af Amer: >60 mL/min (ref >60)
Glucose, Bld: 139 mg/dL — ABNORMAL HIGH (ref 70–99)
Potassium: 3.4 mmol/L — ABNORMAL LOW (ref 3.5–5.1)
Sodium: 140 mmol/L (ref 135–145)
Total Bilirubin: 0.7 mg/dL (ref 0.3–1.2)
Total Protein: 6.3 g/dL — ABNORMAL LOW (ref 6.5–8.1)

## 2018-08-07 LAB — CBC WITH DIFFERENTIAL/PLATELET
Abs Immature Granulocytes: 0.03 10*3/uL (ref 0.00–0.07)
Basophils Absolute: 0 10*3/uL (ref 0.0–0.1)
Basophils Relative: 0 %
Eosinophils Absolute: 0 10*3/uL (ref 0.0–0.5)
Eosinophils Relative: 0 %
HCT: 37.5 % — ABNORMAL LOW (ref 39.0–52.0)
Hemoglobin: 12.1 g/dL — ABNORMAL LOW (ref 13.0–17.0)
Immature Granulocytes: 1 %
Lymphocytes Relative: 8 %
Lymphs Abs: 0.2 10*3/uL — ABNORMAL LOW (ref 0.7–4.0)
MCH: 30 pg (ref 26.0–34.0)
MCHC: 32.3 g/dL (ref 30.0–36.0)
MCV: 92.8 fL (ref 80.0–100.0)
Monocytes Absolute: 0.1 10*3/uL (ref 0.1–1.0)
Monocytes Relative: 4 %
Neutro Abs: 2.5 10*3/uL (ref 1.7–7.7)
Neutrophils Relative %: 87 %
Platelets: 194 10*3/uL (ref 150–400)
RBC: 4.04 MIL/uL — ABNORMAL LOW (ref 4.22–5.81)
RDW: 13.2 % (ref 11.5–15.5)
WBC: 2.9 10*3/uL — ABNORMAL LOW (ref 4.0–10.5)
nRBC: 0 % (ref 0.0–0.2)

## 2018-08-07 LAB — C-REACTIVE PROTEIN: CRP: 8.4 mg/dL — ABNORMAL HIGH (ref <1.0)

## 2018-08-07 LAB — MAGNESIUM: Magnesium: 1.6 mg/dL — ABNORMAL LOW (ref 1.7–2.4)

## 2018-08-07 LAB — D-DIMER, QUANTITATIVE: D-Dimer, Quant: 1.18 ug/mL-FEU — ABNORMAL HIGH (ref 0.00–0.50)

## 2018-08-07 LAB — FERRITIN: Ferritin: 675 ng/mL — ABNORMAL HIGH (ref 24–336)

## 2018-08-07 MED ORDER — QUETIAPINE FUMARATE 50 MG PO TABS
50.0000 mg | ORAL_TABLET | Freq: Two times a day (BID) | ORAL | Status: DC
  Administered 2018-08-07 – 2018-08-08 (×3): 50 mg via ORAL
  Filled 2018-08-07 (×4): qty 1

## 2018-08-07 MED ORDER — KETOROLAC TROMETHAMINE 30 MG/ML IJ SOLN
30.0000 mg | Freq: Once | INTRAMUSCULAR | Status: AC
  Administered 2018-08-07: 30 mg via INTRAVENOUS
  Filled 2018-08-07: qty 1

## 2018-08-07 MED ORDER — ENSURE ENLIVE PO LIQD
237.0000 mL | Freq: Three times a day (TID) | ORAL | Status: DC
  Administered 2018-08-07 – 2018-08-11 (×7): 237 mL via ORAL

## 2018-08-07 MED ORDER — OLANZAPINE 5 MG PO TABS
5.0000 mg | ORAL_TABLET | Freq: Every day | ORAL | Status: DC
  Administered 2018-08-07 – 2018-08-08 (×2): 5 mg via ORAL
  Filled 2018-08-07 (×2): qty 1

## 2018-08-07 MED ORDER — TRAZODONE HCL 50 MG PO TABS
50.0000 mg | ORAL_TABLET | Freq: Every day | ORAL | Status: DC
  Administered 2018-08-07 – 2018-08-16 (×10): 50 mg via ORAL
  Filled 2018-08-07 (×10): qty 1

## 2018-08-07 MED ORDER — MAGNESIUM SULFATE 2 GM/50ML IV SOLN
2.0000 g | Freq: Once | INTRAVENOUS | Status: AC
  Administered 2018-08-07: 2 g via INTRAVENOUS
  Filled 2018-08-07: qty 50

## 2018-08-07 MED ORDER — POTASSIUM CHLORIDE CRYS ER 20 MEQ PO TBCR
40.0000 meq | EXTENDED_RELEASE_TABLET | Freq: Once | ORAL | Status: AC
  Administered 2018-08-07: 40 meq via ORAL
  Filled 2018-08-07: qty 2

## 2018-08-07 MED ORDER — ADULT MULTIVITAMIN W/MINERALS CH
1.0000 | ORAL_TABLET | Freq: Every day | ORAL | Status: DC
  Administered 2018-08-07 – 2018-08-11 (×5): 1 via ORAL
  Filled 2018-08-07 (×6): qty 1

## 2018-08-07 MED ORDER — ALPRAZOLAM 0.5 MG PO TABS
0.5000 mg | ORAL_TABLET | Freq: Three times a day (TID) | ORAL | Status: DC | PRN
  Administered 2018-08-08 (×3): 0.5 mg via ORAL
  Filled 2018-08-07 (×3): qty 1

## 2018-08-07 MED ORDER — PALIPERIDONE ER 6 MG PO TB24
12.0000 mg | ORAL_TABLET | Freq: Every morning | ORAL | Status: DC
  Administered 2018-08-08 – 2018-08-16 (×9): 12 mg via ORAL
  Filled 2018-08-07 (×10): qty 2

## 2018-08-07 NOTE — Plan of Care (Signed)
  Problem: Health Behavior/Discharge Planning: Goal: Ability to manage health-related needs will improve Outcome: Progressing   Problem: Clinical Measurements: Goal: Ability to maintain clinical measurements within normal limits will improve Outcome: Progressing   Problem: Clinical Measurements: Goal: Respiratory complications will improve Outcome: Progressing   

## 2018-08-07 NOTE — Progress Notes (Addendum)
PROGRESS NOTE                                                                                                                                                                                                             Patient Demographics:    Mason May, is a 73 y.o. male, DOB - [birth year], GNF:[id number]  Admit date - 2018-08-27   Admitting Physician Starleen Arms, MD  Outpatient Primary MD for the patient is Lance Bosch, MD  LOS - 1   No chief complaint on file.      Brief Narrative    73 y.o. male, with past medical history of CVA, dementia, seizures, schizophrenia, interstitial lung disease on CellCept, COPD, baseline on 4 L nasal cannula, patient is a resident at [facility], [place], which has COVID-19 outbreak, was sent to [facility] emergency department because of concern of psychiatric illness, patient reports he is feeling going crazy, but he was noted to have fever 100.2, tachycardic, chest x-ray significant for multifocal pneumonia, his COVID-19 test came back positive, so patient transferred to [facility] for further care.   Subjective:    Zymarion Favorite today reports he is feeling better, but aching all over, reports dyspnea at baseline, significant events overnight as discussed with staff.   Assessment  & Plan :    Active Problems:   Seizure (HCC)   Schizo affective schizophrenia (HCC)   Chronic respiratory failure with hypoxia (HCC)   Pneumonia   Hypothyroidism   Interstitial lung disease (HCC)   COVID-19 virus infection   COVID-19   Covid 19 infection -Patient was febrile, tachycardic upon presentation to ED, his COVID-19 facility, sister reports he has been having symptoms for 2 weeks. -Did receive Actemra 5 -He is on IV solumedrol 40 mg IV every 8 hours. -Patient with history of COPD/tissue lung disease lung disease( on Cellicept)  and chronic respiratory failure on 4 L nasal cannula at baseline, he is at the very  high risk for decompensation, he will need close monitoring.  Interstitial lung disease -Continue with CellCept, will need close monitoring in the setting of COVID-19 infection  Acute metabolic encephalopathy -This most likely in the setting of infectious process, he is more awake and appropriate today .  COPD and chronic hypoxic respiratory failure -At baseline, on 4 L nasal cannula, continue with home  medication including Advair, Combivent as well.  Schizophrenia and schizoaffective disorder,/hallucination -Resume home meds once confirmed by pharmacy  History of seizures -Continue with Keppra  Hypothyroidism -Continue with home dose levothyroxine  Hyperlipidemia -Continue with statin  BPH -Continue with Flomax  Hypokalemia/hypomagnesemia -Repleted, recheck in a.m.  Stage 2 sacral pressure ulcer      COVID-19 Labs  Recent Labs    Aug 29, 2018 1223 08-29-18 1230 08/07/18 0313  DDIMER  --   --  1.18*  FERRITIN 685*  --  675*  LDH 152  --   --   CRP  --  8.0* 8.4*    Lab Results  Component Value Date   SARSCOV2NAA POSITIVE (A) August 29, 2018     Code Status : DNR  Family Communication  : D/W sister 29-Aug-2018, will call today  Disposition Plan  : back to SNF when stable  Consults  :  None  Procedures  : None  DVT Prophylaxis  :  [place] lovenox  Lab Results  Component Value Date   PLT 194 08/07/2018    Antibiotics  :    Anti-infectives (From admission, onward)   Start     Dose/Rate Route Frequency Ordered Stop   29-Aug-2018 1930  azithromycin (ZITHROMAX) 500 mg in sodium chloride 0.9 % 250 mL IVPB     500 mg 250 mL/hr over 60 Minutes Intravenous Every 24 hours Aug 29, 2018 1928          Objective:   Vitals:   08/07/18 0000 08/07/18 0451 08/07/18 0758 08/07/18 1334  BP: 129/75 (!) 122/93    Pulse: (!) 117 96    Resp: (!) 24 (!) 28    Temp: 99.8 F (37.7 C) 98 F (36.7 C) 97.6 F (36.4 C) 98.8 F (37.1 C)  TempSrc: Oral Oral Oral Axillary    SpO2: 96% 97%    Weight:  87.4 kg    Height:        Wt Readings from Last 3 Encounters:  08/07/18 87.4 kg  29-Aug-2018 84.4 kg  01/25/17 92.5 kg     Intake/Output Summary (Last 24 hours) at 08/07/2018 1426 Last data filed at 08/07/2018 0908 Gross per 24 hour  Intake 1387 ml  Output --  Net 1387 ml     Physical Exam  Awake Alert, Oriented X 2, more awake and appropriate appropriate today, but he remains impaired Symmetrical Chest wall movement, Good air movement bilaterally, no wheezing RRR,No Gallops,Rubs or new Murmurs, No Parasternal Heave +ve B.Sounds, Abd Soft, No tenderness,  No rebound - guarding or rigidity. No Cyanosis, Clubbing , +1 edema, No new Rash or bruise      Data Review:    CBC Recent Labs  Lab 08-29-18 1223 2018-08-29 1945 08/07/18 0313  WBC 5.2 4.0 2.9*  HGB 12.9* 12.6* 12.1*  HCT 40.1 39.4 37.5*  PLT 200 194 194  MCV 91.6 94.5 92.8  MCH 29.5 30.2 30.0  MCHC 32.2 32.0 32.3  RDW 13.1 13.2 13.2  LYMPHSABS 1.1  --  0.2*  MONOABS 0.6  --  0.1  EOSABS 0.0  --  0.0  BASOSABS 0.0  --  0.0    Chemistries  Recent Labs  Lab 2018-08-29 1223 08/29/18 1945 08/07/18 0313  NA 140  --  140  K 3.3*  --  3.4*  CL 100  --  101  CO2 28  --  29  GLUCOSE 90  --  139*  BUN 11  --  9  CREATININE 0.38* 0.40* 0.31*  CALCIUM  8.5*  --  8.4*  MG  --   --  1.6*  AST 23  --  24  ALT 14  --  15  ALKPHOS 54  --  48  BILITOT 1.0  --  0.7   ------------------------------------------------------------------------------------------------------------------ No results for input(s): CHOL, HDL, LDLCALC, TRIG, CHOLHDL, LDLDIRECT in the last 72 hours.  No results found for: HGBA1C ------------------------------------------------------------------------------------------------------------------ No results for input(s): TSH, T4TOTAL, T3FREE, THYROIDAB in the last 72 hours.  Invalid input(s):  FREET3 ------------------------------------------------------------------------------------------------------------------ Recent Labs    08/07/2018 1223 08/07/18 0313  FERRITIN 685* 675*    Coagulation profile No results for input(s): INR, PROTIME in the last 168 hours.  Recent Labs    08/07/18 0313  DDIMER 1.18*    Cardiac Enzymes No results for input(s): CKMB, TROPONINI, MYOGLOBIN in the last 168 hours.  Invalid input(s): CK ------------------------------------------------------------------------------------------------------------------    Component Value Date/Time   BNP 11.0 01/25/2017 1221    Inpatient Medications  Scheduled Meds:  aspirin EC  81 mg Oral Daily   atorvastatin  20 mg Oral Daily   divalproex  500 mg Oral QHS   enoxaparin (LOVENOX) injection  40 mg Subcutaneous Q24H   feeding supplement (ENSURE ENLIVE)  237 mL Oral TID BM   Ipratropium-Albuterol  1 puff Inhalation QID   levothyroxine  50 mcg Oral Q0600   methylPREDNISolone (SOLU-MEDROL) injection  40 mg Intravenous Q8H   mometasone-formoterol  2 puff Inhalation BID   multivitamin with minerals  1 tablet Oral Daily   mycophenolate  1,500 mg Oral BID   tamsulosin  0.4 mg Oral QPC supper   vitamin C  500 mg Oral Daily   zinc sulfate  220 mg Oral Daily   Continuous Infusions:  azithromycin Stopped (08/07/18 0129)   PRN Meds:.acetaminophen  Micro Results Recent Results (from the past 240 hour(s))  Blood Culture (routine x 2)     Status: None (Preliminary result)   Collection Time: 08/03/2018 12:30 PM  Result Value Ref Range Status   Specimen Description BLOOD LEFT ARM  Final   Special Requests   Final    BOTTLES DRAWN AEROBIC AND ANAEROBIC Blood Culture adequate volume   Culture   Final    NO GROWTH < 24 HOURS Performed at [facility], [address] [place], [place] [address]    Report Status PENDING  Incomplete  SARS Coronavirus 2 [facility](Hospital order, Performed [facility])     Status: Abnormal   Collection Time: 08/21/2018 12:30 PM  Result Value Ref Range Status   SARS Coronavirus 2 POSITIVE (A) NEGATIVE Final    Comment: RESULT CALLED TO, READ BACK BY AND VERIFIED WITH: KAILEY WALKER 08/19/2018 @ 1419  MLK (NOTE) If result is NEGATIVE SARS-CoV-2 target nucleic acids are NOT DETECTED. The SARS-CoV-2 RNA is generally detectable in upper and lower  respiratory specimens during the acute phase of infection. The lowest  concentration of SARS-CoV-2 viral copies this assay can detect is 250  copies / mL. A negative result does not preclude SARS-CoV-2 infection  and should not be used as the sole basis for treatment or other  patient management decisions.  A negative result may occur with  improper specimen collection / handling, submission of specimen other  than nasopharyngeal swab, presence of viral mutation(s) within the  areas targeted by this assay, and inadequate number of viral copies  (<250 copies / mL). A negative result must be combined with clinical  observations, patient history, and epidemiological information.  If result is POSITIVE SARS-CoV-2 target nucleic acids are DETECTED. The  SARS-CoV-2 RNA is generally detectable in upper and lower  respiratory specimens during the acute phase of infection.  Positive  results are indicative of active infection with SARS-CoV-2.  Clinical  correlation with patient history and other diagnostic information is  necessary to determine patient infection status.  Positive results do  not rule out bacterial infection or co-infection with other viruses. If result is PRESUMPTIVE POSTIVE SARS-CoV-2 nucleic acids MAY BE PRESENT.   A presumptive positive result was obtained on the submitted specimen  and confirmed on repeat testing.  While 2019 novel coronavirus  (SARS-CoV-2) nucleic acids may be present in the submitted sample  additional confirmatory testing may be necessary for epidemiological   and / or clinical management purposes  to differentiate between  SARS-CoV-2 and other Sarbecovirus currently known to infect humans.  If clinically indicated additional testing with an alternate test  methodology [phone]) is a dvised. The SARS-CoV-2 RNA is generally  detectable in upper and lower respiratory specimens during the acute  phase of infection. The expected result is Negative. Fact Sheet for Patients:  BoilerBrush.com.cy Fact Sheet for Healthcare Providers: https://pope.com/ This test is not yet approved or cleared by the [place] FDA and has been authorized for detection and/or diagnosis of SARS-CoV-2 by FDA under an Emergency Use Authorization (EUA).  This EUA will remain in effect (meaning this test can be used) for the duration of the COVID-19 declaration under Section 564(b)(1) of the Act, 21 U.S.C. section 360bbb-3(b)(1), unless the authorization is terminated or revoked sooner. Performed at [facility], [address], [place], [place] [address]     Radiology Reports Dg Chest [place] 1 View  Result Date: 08/12/18 CLINICAL DATA:  Fever.  COVID-19 exposure EXAM: PORTABLE CHEST 1 VIEW COMPARISON:  01/25/2017 and CT chest 01/25/2018 FINDINGS: Chronic lung disease with scarring based on prior CT. Superimposed patchy airspace disease bilaterally is new compared with the prior study. No effusion. IMPRESSION: COPD with scarring Superimposed acute airspace disease bilaterally suspicious for pneumonia. Electronically Signed   By: Marlan Palau M.D.   On: 08-12-18 12:57    Time Spent in minutes  25 minutes   Huey Bienenstock M.D on 08/07/2018 at 2:26 PM  Between 7am to 7pm - Pager - [phone]  After 7pm go to [email or web address] - password [facility]  [facility] -  Office  [phone]

## 2018-08-07 NOTE — Progress Notes (Addendum)
Asked patient multiple times if I could reposition and turn him. Patient has refused every time. Will continue to ask, patient currently has a stage 2 on his sacrum area. Asked patient to elevate his legs and patient refused. Will continue to monitor.

## 2018-08-07 NOTE — Progress Notes (Signed)
Pt requested RN to call sister phone number. Pt spoke with sister on the phone.

## 2018-08-07 NOTE — Progress Notes (Addendum)
Initial Nutrition Assessment RD working remotely.  DOCUMENTATION CODES:   Not applicable  INTERVENTION:    Liberalize diet to regular   Ensure Enlive po TID, each supplement provides 350 kcal and 20 grams of protein   Multivitamin daily   Patient will receive on meals trays:   Magic cup BID each supplement provides 290 kcal and 9 grams of protein   Vital Cuisine Shake once daily, each supplement provides 520 kcal and 22 grams of protein   NUTRITION DIAGNOSIS:   Increased nutrient needs related to wound healing, acute illness as evidenced by estimated needs.  GOAL:   Patient will meet greater than or equal to 90% of their needs  MONITOR:   PO intake, Supplement acceptance, Labs, Skin  REASON FOR ASSESSMENT:   Low Braden    ASSESSMENT:   73 yo male with PMH of schizophrenia, CVD, seizures, dementia who was admitted from [facility] with COVID-19 infection.   Patient confused, RD unable to retrieve any nutrition history. Suspect intake PTA was poor.   Currently on a heart healthy diet consuming 15-25% of meals. He would benefit from diet liberalization and addition of PO supplements to maximize oral intake. Spoke with attending MD, okay to liberalize diet to regular.  Labs reviewed. Potassium 3.3 (L)  Medications reviewed and include Flomax, vitamin C, zinc, Solu-medrol.  Per RN documentation, patient has +4 edema to LLE and +2 edema to RLE.  NUTRITION - FOCUSED PHYSICAL EXAM:  unable to complete  Diet Order:   Diet Order            Diet regular Room service appropriate? Yes; Fluid consistency: Thin  Diet effective now              EDUCATION NEEDS:   No education needs have been identified at this time  Skin:  Skin Assessment: Skin Integrity Issues: Skin Integrity Issues:: Stage I, Stage II Stage I: scrotum Stage II: coccyx  Last BM:  no BM documented  Height:   Ht Readings from Last 1 Encounters:  September 03, 2018 6' (1.829 m)     Weight:   Wt Readings from Last 1 Encounters:  08/07/18 87.4 kg    Ideal Body Weight:  80.9 kg  BMI:  Body mass index is 26.13 kg/m.  Estimated Nutritional Needs:   Kcal:  2100-2300  Protein:  115-130 gm  Fluid:  >/= 2.1 L    Joaquin Courts, RD, LDN, CNSC Pager [phone] After Hours Pager [phone]

## 2018-08-08 DIAGNOSIS — L8990 Pressure ulcer of unspecified site, unspecified stage: Secondary | ICD-10-CM

## 2018-08-08 DIAGNOSIS — R569 Unspecified convulsions: Secondary | ICD-10-CM

## 2018-08-08 DIAGNOSIS — F250 Schizoaffective disorder, bipolar type: Secondary | ICD-10-CM

## 2018-08-08 LAB — CBC WITH DIFFERENTIAL/PLATELET
Abs Immature Granulocytes: 0.03 10*3/uL (ref 0.00–0.07)
Basophils Absolute: 0 10*3/uL (ref 0.0–0.1)
Basophils Relative: 1 %
Eosinophils Absolute: 0 10*3/uL (ref 0.0–0.5)
Eosinophils Relative: 0 %
HCT: 36.7 % — ABNORMAL LOW (ref 39.0–52.0)
Hemoglobin: 12 g/dL — ABNORMAL LOW (ref 13.0–17.0)
Immature Granulocytes: 1 %
Lymphocytes Relative: 20 %
Lymphs Abs: 0.4 10*3/uL — ABNORMAL LOW (ref 0.7–4.0)
MCH: 30 pg (ref 26.0–34.0)
MCHC: 32.7 g/dL (ref 30.0–36.0)
MCV: 91.8 fL (ref 80.0–100.0)
Monocytes Absolute: 0.3 10*3/uL (ref 0.1–1.0)
Monocytes Relative: 15 %
Neutro Abs: 1.4 10*3/uL — ABNORMAL LOW (ref 1.7–7.7)
Neutrophils Relative %: 63 %
Platelets: 225 10*3/uL (ref 150–400)
RBC: 4 MIL/uL — ABNORMAL LOW (ref 4.22–5.81)
RDW: 13.2 % (ref 11.5–15.5)
WBC: 2.2 10*3/uL — ABNORMAL LOW (ref 4.0–10.5)
nRBC: 0 % (ref 0.0–0.2)

## 2018-08-08 LAB — COMPREHENSIVE METABOLIC PANEL
ALT: 17 U/L (ref 0–44)
AST: 27 U/L (ref 15–41)
Albumin: 2.9 g/dL — ABNORMAL LOW (ref 3.5–5.0)
Alkaline Phosphatase: 45 U/L (ref 38–126)
Anion gap: 8 (ref 5–15)
BUN: 9 mg/dL (ref 8–23)
CO2: 30 mmol/L (ref 22–32)
Calcium: 8.7 mg/dL — ABNORMAL LOW (ref 8.9–10.3)
Chloride: 104 mmol/L (ref 98–111)
Creatinine, Ser: 0.34 mg/dL — ABNORMAL LOW (ref 0.61–1.24)
GFR calc Af Amer: >60 mL/min (ref >60)
GFR calc non Af Amer: >60 mL/min (ref >60)
Glucose, Bld: 169 mg/dL — ABNORMAL HIGH (ref 70–99)
Potassium: 4.1 mmol/L (ref 3.5–5.1)
Sodium: 142 mmol/L (ref 135–145)
Total Bilirubin: 0.4 mg/dL (ref 0.3–1.2)
Total Protein: 5.9 g/dL — ABNORMAL LOW (ref 6.5–8.1)

## 2018-08-08 LAB — HEPATITIS B SURFACE ANTIGEN: Hepatitis B Surface Ag: NEGATIVE

## 2018-08-08 LAB — D-DIMER, QUANTITATIVE: D-Dimer, Quant: 0.62 ug/mL-FEU — ABNORMAL HIGH (ref 0.00–0.50)

## 2018-08-08 LAB — MAGNESIUM: Magnesium: 2.1 mg/dL (ref 1.7–2.4)

## 2018-08-08 LAB — HIV ANTIBODY (ROUTINE TESTING W REFLEX): HIV Screen 4th Generation wRfx: NONREACTIVE

## 2018-08-08 LAB — C-REACTIVE PROTEIN: CRP: 5.1 mg/dL — ABNORMAL HIGH (ref <1.0)

## 2018-08-08 LAB — FERRITIN: Ferritin: 571 ng/mL — ABNORMAL HIGH (ref 24–336)

## 2018-08-08 MED ORDER — PANTOPRAZOLE SODIUM 40 MG PO TBEC
40.0000 mg | DELAYED_RELEASE_TABLET | Freq: Every day | ORAL | Status: DC
  Administered 2018-08-08 – 2018-08-16 (×9): 40 mg via ORAL
  Filled 2018-08-08 (×10): qty 1

## 2018-08-08 MED ORDER — PREDNISONE 10 MG PO TABS
20.0000 mg | ORAL_TABLET | Freq: Every day | ORAL | Status: DC
  Administered 2018-08-09: 20 mg via ORAL
  Filled 2018-08-08 (×2): qty 2

## 2018-08-08 MED ORDER — POLYETHYLENE GLYCOL 3350 17 G PO PACK
17.0000 g | PACK | Freq: Two times a day (BID) | ORAL | Status: AC
  Administered 2018-08-08 – 2018-08-09 (×3): 17 g via ORAL
  Filled 2018-08-08 (×3): qty 1

## 2018-08-08 MED ORDER — FUROSEMIDE 40 MG PO TABS
60.0000 mg | ORAL_TABLET | Freq: Once | ORAL | Status: AC
  Administered 2018-08-08: 60 mg via ORAL
  Filled 2018-08-08: qty 1

## 2018-08-08 MED ORDER — AZITHROMYCIN 250 MG PO TABS
500.0000 mg | ORAL_TABLET | ORAL | Status: DC
  Administered 2018-08-08: 22:00:00 500 mg via ORAL
  Filled 2018-08-08 (×2): qty 2

## 2018-08-08 MED ORDER — PANTOPRAZOLE SODIUM 40 MG IV SOLR: 40.0000 mg | INTRAVENOUS | Status: DC

## 2018-08-08 NOTE — Progress Notes (Signed)
PROGRESS NOTE                                                                                                                                                                                                             Patient Demographics:    Mason May, is a 73 y.o. male, DOB - November 28, 1945, ZOX:[id number]  Admit date - 09-Aug-2018   Admitting Physician Starleen Arms, MD  Outpatient Primary MD for the patient is Lance Bosch, MD  LOS - 2   No chief complaint on file.      Brief Narrative    73 y.o. male, with past medical history of CVA, dementia, seizures, schizophrenia, interstitial lung disease on CellCept, COPD, baseline on 4 L nasal cannula, patient is a resident at [facility], [place], which has COVID-19 outbreak, was sent to [facility] emergency department because of concern of psychiatric illness, patient reports he is feeling going crazy, but he was noted to have fever 100.2, tachycardic, chest x-ray significant for multifocal pneumonia, his COVID-19 test came back positive, so patient transferred to [facility] for further care.   Subjective:    Mason May today reports he is feeling better, dyspnea at baseline.  Reports he is feeling better, he remains afebrile.   Assessment  & Plan :    Active Problems:   Seizure (HCC)   Schizo affective schizophrenia (HCC)   Chronic respiratory failure with hypoxia (HCC)   Pneumonia   Hypothyroidism   Interstitial lung disease (HCC)   COVID-19 virus infection   COVID-19   Pressure injury of skin   Pneumonia due to Covid 19 infection -Patient was febrile, tachycardic upon presentation to ED, his COVID-19 facility, sister reports he has been having symptoms for 2 weeks. -Did receive Actemra 08-09-18 -He is on IV solumedrol 40 mg IV every 8 hours.  Will Change to p.o. prednisone from tomorrow. -Patient with history of COPD/tissue lung disease lung disease( on Cellicept)  and chronic  respiratory failure on 4 L nasal cannula at baseline, he is at the very high risk for decompensation, he will need close monitoring. - will give 60 mg of lasix today.  Interstitial lung disease -Continue with CellCept, will need close monitoring in the setting of COVID-19 infection, he has no increased oxygen equipments.  Acute metabolic encephalopathy -This most likely in the setting  of infectious process, he is more awake and appropriate today .  COPD and chronic hypoxic respiratory failure -At baseline, on 4 L nasal cannula, continue with home medication including Advair, Combivent as well.  Schizophrenia and schizoaffective disorder,/hallucination -Resume home meds once confirmed by pharmacy  History of seizures -Continue with Keppra  Hypothyroidism -Continue with home dose levothyroxine  Hyperlipidemia -Continue with statin  BPH -Continue with Flomax  Hypokalemia/hypomagnesemia -Repleted,   Stage 2 sacral pressure ulcer      COVID-19 Labs  Recent Labs    16-Dec-2018 1223 16-Dec-2018 1230 08/07/18 0313 08/08/18 0126  DDIMER  --   --  1.18* 0.62*  FERRITIN 685*  --  675* 571*  LDH 152  --   --   --   CRP  --  8.0* 8.4* 5.1*    Lab Results  Component Value Date   SARSCOV2NAA POSITIVE (A) 23-Sep-202020     Code Status : DNR  Family Communication  : D/W sister 08/07/2018, will call today  Disposition Plan  : back to SNF when stable  Consults  :  None  Procedures  : None  DVT Prophylaxis  :   lovenox  Lab Results  Component Value Date   PLT 225 08/08/2018    Antibiotics  :    Anti-infectives (From admission, onward)   Start     Dose/Rate Route Frequency Ordered Stop   16-Dec-2018 1930  azithromycin (ZITHROMAX) 500 mg in sodium chloride 0.9 % 250 mL IVPB     500 mg 250 mL/hr over 60 Minutes Intravenous Every 24 hours 16-Dec-2018 1928          Objective:   Vitals:   08/08/18 0400 08/08/18 0447 08/08/18 0802 08/08/18 1113  BP: 115/75      Pulse: 83   (!) 111  Resp:      Temp:  97.9 F (36.6 C) 97.7 F (36.5 C)   TempSrc:  Oral Oral   SpO2: 96%   93%  Weight:      Height:        Wt Readings from Last 3 Encounters:  08/07/18 87.4 kg  16-Dec-2018 84.4 kg  01/25/17 92.5 kg     Intake/Output Summary (Last 24 hours) at 08/08/2018 1303 Last data filed at 08/08/2018 0600 Gross per 24 hour  Intake -  Output 675 ml  Net -675 ml     Physical Exam  awake Alert, Oriented X 2, No new F.N deficits, more appropriate and conversant today Symmetrical Chest wall movement, Good air movement bilaterally, CTAB RRR,No Gallops,Rubs or new Murmurs, No Parasternal Heave +ve B.Sounds, Abd Soft, No tenderness, No rebound - guarding or rigidity. No Cyanosis, Clubbing ,+1 edema, No new Rash or bruise     Data Review:    CBC Recent Labs  Lab 16-Dec-2018 1223 16-Dec-2018 1945 08/07/18 0313 08/08/18 0126  WBC 5.2 4.0 2.9* 2.2*  HGB 12.9* 12.6* 12.1* 12.0*  HCT 40.1 39.4 37.5* 36.7*  PLT 200 194 194 225  MCV 91.6 94.5 92.8 91.8  MCH 29.5 30.2 30.0 30.0  MCHC 32.2 32.0 32.3 32.7  RDW 13.1 13.2 13.2 13.2  LYMPHSABS 1.1  --  0.2* 0.4*  MONOABS 0.6  --  0.1 0.3  EOSABS 0.0  --  0.0 0.0  BASOSABS 0.0  --  0.0 0.0    Chemistries  Recent Labs  Lab 16-Dec-2018 1223 16-Dec-2018 1945 08/07/18 0313 08/08/18 0126  NA 140  --  140 142  K 3.3*  --  3.4*  4.1  CL 100  --  101 104  CO2 28  --  29 30  GLUCOSE 90  --  139* 169*  BUN 11  --  9 9  CREATININE 0.38* 0.40* 0.31* 0.34*  CALCIUM 8.5*  --  8.4* 8.7*  MG  --   --  1.6* 2.1  AST 23  --  24 27  ALT 14  --  15 17  ALKPHOS 54  --  48 45  BILITOT 1.0  --  0.7 0.4   ------------------------------------------------------------------------------------------------------------------ No results for input(s): CHOL, HDL, LDLCALC, TRIG, CHOLHDL, LDLDIRECT in the last 72 hours.  No results found for: HGBA1C  ------------------------------------------------------------------------------------------------------------------ No results for input(s): TSH, T4TOTAL, T3FREE, THYROIDAB in the last 72 hours.  Invalid input(s): FREET3 ------------------------------------------------------------------------------------------------------------------ Recent Labs    08/07/18 0313 08/08/18 0126  FERRITIN 675* 571*    Coagulation profile No results for input(s): INR, PROTIME in the last 168 hours.  Recent Labs    08/07/18 0313 08/08/18 0126  DDIMER 1.18* 0.62*    Cardiac Enzymes No results for input(s): CKMB, TROPONINI, MYOGLOBIN in the last 168 hours.  Invalid input(s): CK ------------------------------------------------------------------------------------------------------------------    Component Value Date/Time   BNP 11.0 01/25/2017 1221    Inpatient Medications  Scheduled Meds: . aspirin EC  81 mg Oral Daily  . atorvastatin  20 mg Oral Daily  . divalproex  500 mg Oral QHS  . enoxaparin (LOVENOX) injection  40 mg Subcutaneous Q24H  . feeding supplement (ENSURE ENLIVE)  237 mL Oral TID BM  . Ipratropium-Albuterol  1 puff Inhalation QID  . levothyroxine  50 mcg Oral Q0600  . methylPREDNISolone (SOLU-MEDROL) injection  40 mg Intravenous Q8H  . mometasone-formoterol  2 puff Inhalation BID  . multivitamin with minerals  1 tablet Oral Daily  . mycophenolate  1,500 mg Oral BID  . OLANZapine  5 mg Oral QHS  . paliperidone  12 mg Oral q morning - 10a  . QUEtiapine  50 mg Oral BID  . tamsulosin  0.4 mg Oral QPC supper  . traZODone  50 mg Oral QHS  . vitamin C  500 mg Oral Daily  . zinc sulfate  220 mg Oral Daily   Continuous Infusions: . azithromycin 500 mg (08/07/18 2021)   PRN Meds:.acetaminophen, ALPRAZolam  Micro Results Recent Results (from the past 240 hour(s))  Blood Culture (routine x 2)     Status: None (Preliminary result)   Collection Time: 07/31/2018 12:30 PM  Result  Value Ref Range Status   Specimen Description BLOOD LEFT ARM  Final   Special Requests   Final    BOTTLES DRAWN AEROBIC AND ANAEROBIC Blood Culture adequate volume   Culture   Final    NO GROWTH 2 DAYS Performed at [facility], [address], [place], [place] [address]    Report Status PENDING  Incomplete  SARS Coronavirus 2 [facility] order, Performed in [facility] hospital lab)     Status: Abnormal   Collection Time: 08/12/2018 12:30 PM  Result Value Ref Range Status   SARS Coronavirus 2 POSITIVE (A) NEGATIVE Final    Comment: RESULT CALLED TO, READ BACK BY AND VERIFIED WITH: KAILEY WALKER 08/14/2018 @ 1419  MLK (NOTE) If result is NEGATIVE SARS-CoV-2 target nucleic acids are NOT DETECTED. The SARS-CoV-2 RNA is generally detectable in upper and lower  respiratory specimens during the acute phase of infection. The lowest  concentration of SARS-CoV-2 viral copies this assay can detect is 250  copies /  mL. A negative result does not preclude SARS-CoV-2 infection  and should not be used as the sole basis for treatment or other  patient management decisions.  A negative result may occur with  improper specimen collection / handling, submission of specimen other  than nasopharyngeal swab, presence of viral mutation(s) within the  areas targeted by this assay, and inadequate number of viral copies  (<250 copies / mL). A negative result must be combined with clinical  observations, patient history, and epidemiological information. If result is POSITIVE SARS-CoV-2 target nucleic acids are DETECTED. The  SARS-CoV-2 RNA is generally detectable in upper and lower  respiratory specimens during the acute phase of infection.  Positive  results are indicative of active infection with SARS-CoV-2.  Clinical  correlation with patient history and other diagnostic information is  necessary to determine patient infection status.  Positive results do  not rule out bacterial infection or  co-infection with other viruses. If result is PRESUMPTIVE POSTIVE SARS-CoV-2 nucleic acids MAY BE PRESENT.   A presumptive positive result was obtained on the submitted specimen  and confirmed on repeat testing.  While 2019 novel coronavirus  (SARS-CoV-2) nucleic acids may be present in the submitted sample  additional confirmatory testing may be necessary for epidemiological  and / or clinical management purposes  to differentiate between  SARS-CoV-2 and other Sarbecovirus currently known to infect humans.  If clinically indicated additional testing with an alternate test  methodology [id number]) is a dvised. The SARS-CoV-2 RNA is generally  detectable in upper and lower respiratory specimens during the acute  phase of infection. The expected result is Negative. Fact Sheet for Patients:  BoilerBrush.com.cy Fact Sheet for Healthcare Providers: https://pope.com/ This test is not yet approved or cleared by the [place] FDA and has been authorized for detection and/or diagnosis of SARS-CoV-2 by FDA under an Emergency Use Authorization (EUA).  This EUA will remain in effect (meaning this test can be used) for the duration of the COVID-19 declaration under Section 564(b)(1) of the Act, 21 U.S.C. section 360bbb-3(b)(1), unless the authorization is terminated or revoked sooner. Performed at [facility], [address] [place], [place] [address]     Radiology Reports Dg Chest [place] 1 View  Result Date: Aug 29, 2018 CLINICAL DATA:  Fever.  COVID-19 exposure EXAM: PORTABLE CHEST 1 VIEW COMPARISON:  01/25/2017 and CT chest 01/25/2018 FINDINGS: Chronic lung disease with scarring based on prior CT. Superimposed patchy airspace disease bilaterally is new compared with the prior study. No effusion. IMPRESSION: COPD with scarring Superimposed acute airspace disease bilaterally suspicious for pneumonia. Electronically Signed   By: Marlan Palau M.D.   On: Aug 29, 2018 12:57    Time Spent in minutes  25 minutes   Huey Bienenstock M.D on 08/08/2018 at 1:03 PM  Between 7am to 7pm - Pager - [phone]  After 7pm go to [email or web address] - password [facility]  [facility] -  Office  [phone]

## 2018-08-08 NOTE — Evaluation (Signed)
Physical Therapy Evaluation and Discharge Patient Details Name: Mason May MRN: [id number] DOB: [birth year] Today's Date: 08/08/2018   History of Present Illness  73 y.o. male, with past medical history of CVA, dementia, seizures, schizophrenia, COPD, baseline 4 L [place], [facility] [place], which has COVID-19 outbreak, was sent to [facility] emergency department because of concern of psychiatric illness, patient reports he is feeling going crazy, but he was noted to have fever 100.2, tachycardic, chest x-ray significant for multifocal pneumonia, his COVID-19 test came back positive.     Clinical Impression  Patient evaluated at bed level due to history of bed bound for more than one year (per pt). He states he tried PT when he initially went to SNF "but that didn't really work out." Patient with HR up to 148 with rolling in bed and is unable to help rolling to his left side. He did use his LUE to assist via rail to roll onto his right side. RN and made aware re: elevated HR. Discussed would need Maxi-move lift for OOB vs chair position for pulmonary hygiene. Patient does not have skilled PT needs and PT is signing off.    Follow Up Recommendations No PT follow up(return to long-term care)    Equipment Recommendations  None recommended by PT    Recommendations for Other Services       Precautions / Restrictions Precautions Precautions: Fall Restrictions Weight Bearing Restrictions: Yes      Mobility  Bed Mobility Overal bed mobility: Needs Assistance Bed Mobility: Rolling Rolling: Max assist;+2 for physical assistance(to left max assist; to rt mod assist used LUE on rail)            Transfers                 General transfer comment: Pt would need lift for OOB, however did not get OOB at SNF. Elevated to semi-chair position with UE's propped on pillows for comfort  Ambulation/Gait                Stairs            Wheelchair  Mobility    Modified Rankin (Stroke Patients Only)       Balance                                             Pertinent Vitals/Pain Pain Assessment: Faces Faces Pain Scale: Hurts even more Pain Location: rt shoulder when rt sidelying Pain Descriptors / Indicators: Discomfort;Grimacing Pain Intervention(s): Limited activity within patient's tolerance;Repositioned    Home Living Family/patient expects to be discharged to:: Skilled nursing facility                      Prior Function Level of Independence: Needs assistance   Gait / Transfers Assistance Needed: bedbound per pt for >1 year; does not get OOB  ADL's / Homemaking Assistance Needed: total assist "can't even really feed myself"        Hand Dominance        Extremity/Trunk Assessment   Upper Extremity Assessment Upper Extremity Assessment: Defer to OT evaluation(used LUE better when rolling (RUE not seen to functionally u)    Lower Extremity Assessment Lower Extremity Assessment: RLE deficits/detail;LLE deficits/detail RLE Deficits / Details: stiff with limited ROM throughout LLE Deficits / Details: stiff with limited  ROM throughout    Cervical / Trunk Assessment Cervical / Trunk Assessment: Other exceptions Cervical / Trunk Exceptions: stiff with very limited trunk rotation during bed mobility/positioning  Communication      Cognition Arousal/Alertness: Awake/alert Behavior During Therapy: WFL for tasks assessed/performed Overall Cognitive Status: No family/caregiver present to determine baseline cognitive functioning                                        General Comments      Exercises     Assessment/Plan    PT Assessment Patent does not need any further PT services  PT Problem List         PT Treatment Interventions      PT Goals (Current goals can be found in the Care Plan section)  Acute Rehab PT Goals Patient Stated Goal: none PT Goal  Formulation: All assessment and education complete, DC therapy    Frequency     Barriers to discharge        Co-evaluation               AM-PAC PT "6 Clicks" Mobility  Outcome Measure Help needed turning from your back to your side while in a flat bed without using bedrails?: Total Help needed moving from lying on your back to sitting on the side of a flat bed without using bedrails?: Total Help needed moving to and from a bed to a chair (including a wheelchair)?: Total Help needed standing up from a chair using your arms (e.g., wheelchair or bedside chair)?: Total Help needed to walk in hospital room?: Total Help needed climbing 3-5 steps with a railing? : Total 6 Click Score: 6    End of Session   Activity Tolerance: Treatment limited secondary to medical complications (Comment)(elevated HR) Patient left: in bed;with bed alarm set(chair position) Nurse Communication: Need for lift equipment(vs chair position) PT Visit Diagnosis: Muscle weakness (generalized) (M62.81)    Time: 8832-5498 PT Time Calculation (min) (ACUTE ONLY): 24 min   Charges:   PT Evaluation $PT Eval Moderate Complexity: 1 Mod            [employer], PT 08/08/2018, 1:33 PM

## 2018-08-09 ENCOUNTER — Other Ambulatory Visit: Payer: Self-pay

## 2018-08-09 ENCOUNTER — Encounter (HOSPITAL_COMMUNITY): Payer: Self-pay

## 2018-08-09 DIAGNOSIS — I959 Hypotension, unspecified: Secondary | ICD-10-CM

## 2018-08-09 LAB — CBC WITH DIFFERENTIAL/PLATELET
Abs Immature Granulocytes: 0.07 10*3/uL (ref 0.00–0.07)
Basophils Absolute: 0 10*3/uL (ref 0.0–0.1)
Basophils Relative: 0 %
Eosinophils Absolute: 0 10*3/uL (ref 0.0–0.5)
Eosinophils Relative: 0 %
HCT: 38.4 % — ABNORMAL LOW (ref 39.0–52.0)
Hemoglobin: 12 g/dL — ABNORMAL LOW (ref 13.0–17.0)
Immature Granulocytes: 2 %
Lymphocytes Relative: 23 %
Lymphs Abs: 1 10*3/uL (ref 0.7–4.0)
MCH: 29 pg (ref 26.0–34.0)
MCHC: 31.3 g/dL (ref 30.0–36.0)
MCV: 92.8 fL (ref 80.0–100.0)
Monocytes Absolute: 0.6 10*3/uL (ref 0.1–1.0)
Monocytes Relative: 15 %
Neutro Abs: 2.7 10*3/uL (ref 1.7–7.7)
Neutrophils Relative %: 60 %
Platelets: 258 10*3/uL (ref 150–400)
RBC: 4.14 MIL/uL — ABNORMAL LOW (ref 4.22–5.81)
RDW: 13.6 % (ref 11.5–15.5)
WBC: 4.4 10*3/uL (ref 4.0–10.5)
nRBC: 0 % (ref 0.0–0.2)

## 2018-08-09 LAB — COMPREHENSIVE METABOLIC PANEL
ALT: 17 U/L (ref 0–44)
AST: 27 U/L (ref 15–41)
Albumin: 3.2 g/dL — ABNORMAL LOW (ref 3.5–5.0)
Alkaline Phosphatase: 45 U/L (ref 38–126)
Anion gap: 10 (ref 5–15)
BUN: 15 mg/dL (ref 8–23)
CO2: 32 mmol/L (ref 22–32)
Calcium: 8.6 mg/dL — ABNORMAL LOW (ref 8.9–10.3)
Chloride: 100 mmol/L (ref 98–111)
Creatinine, Ser: 0.4 mg/dL — ABNORMAL LOW (ref 0.61–1.24)
GFR calc Af Amer: >60 mL/min (ref >60)
GFR calc non Af Amer: >60 mL/min (ref >60)
Glucose, Bld: 90 mg/dL (ref 70–99)
Potassium: 3.2 mmol/L — ABNORMAL LOW (ref 3.5–5.1)
Sodium: 142 mmol/L (ref 135–145)
Total Bilirubin: 0.6 mg/dL (ref 0.3–1.2)
Total Protein: 6 g/dL — ABNORMAL LOW (ref 6.5–8.1)

## 2018-08-09 LAB — GLUCOSE, CAPILLARY: Glucose-Capillary: 78 mg/dL (ref 70–99)

## 2018-08-09 LAB — MAGNESIUM: Magnesium: 1.9 mg/dL (ref 1.7–2.4)

## 2018-08-09 LAB — C-REACTIVE PROTEIN: CRP: 1.5 mg/dL — ABNORMAL HIGH (ref <1.0)

## 2018-08-09 LAB — FERRITIN: Ferritin: 603 ng/mL — ABNORMAL HIGH (ref 24–336)

## 2018-08-09 LAB — D-DIMER, QUANTITATIVE: D-Dimer, Quant: 0.6 ug/mL-FEU — ABNORMAL HIGH (ref 0.00–0.50)

## 2018-08-09 MED ORDER — QUETIAPINE FUMARATE 25 MG PO TABS
25.0000 mg | ORAL_TABLET | Freq: Two times a day (BID) | ORAL | Status: DC
  Administered 2018-08-09 – 2018-08-10 (×3): 25 mg via ORAL
  Filled 2018-08-09 (×5): qty 1

## 2018-08-09 MED ORDER — ALPRAZOLAM 0.5 MG PO TABS: 0.2500 mg | ORAL_TABLET | Freq: Every evening | ORAL | Status: DC | PRN

## 2018-08-09 MED ORDER — MIDODRINE HCL 5 MG PO TABS
5.0000 mg | ORAL_TABLET | Freq: Two times a day (BID) | ORAL | Status: DC
  Administered 2018-08-09 – 2018-08-10 (×3): 5 mg via ORAL
  Filled 2018-08-09 (×5): qty 1

## 2018-08-09 MED ORDER — SODIUM CHLORIDE 0.9 % IV BOLUS
1000.0000 mL | Freq: Once | INTRAVENOUS | Status: AC
  Administered 2018-08-09: 1000 mL via INTRAVENOUS

## 2018-08-09 MED ORDER — ALPRAZOLAM 0.5 MG PO TABS: 0.2500 mg | ORAL_TABLET | Freq: Two times a day (BID) | ORAL | Status: DC | PRN

## 2018-08-09 MED ORDER — QUETIAPINE FUMARATE 25 MG PO TABS: 25.0000 mg | ORAL_TABLET | Freq: Every day | ORAL | Status: DC

## 2018-08-09 MED ORDER — POTASSIUM CHLORIDE CRYS ER 20 MEQ PO TBCR
40.0000 meq | EXTENDED_RELEASE_TABLET | ORAL | Status: AC
  Administered 2018-08-09 (×2): 40 meq via ORAL
  Filled 2018-08-09 (×2): qty 2

## 2018-08-09 MED ORDER — VALBENAZINE TOSYLATE 40 MG PO CAPS: 40.0000 mg | ORAL_CAPSULE | Freq: Every day | ORAL | Status: DC

## 2018-08-09 MED ORDER — OLANZAPINE 2.5 MG PO TABS
2.5000 mg | ORAL_TABLET | Freq: Every day | ORAL | Status: DC
  Administered 2018-08-09: 23:00:00 2.5 mg via ORAL
  Filled 2018-08-09 (×2): qty 1

## 2018-08-09 MED ORDER — ALPRAZOLAM 0.5 MG PO TABS
0.2500 mg | ORAL_TABLET | Freq: Three times a day (TID) | ORAL | Status: DC | PRN
  Administered 2018-08-09 – 2018-08-10 (×3): 0.25 mg via ORAL
  Filled 2018-08-09 (×3): qty 1

## 2018-08-09 NOTE — Progress Notes (Addendum)
PROGRESS NOTE                                                                                                                                                                                                             Patient Demographics:    Mason May, is a 73 y.o. male, DOB - 21-Mar-1946, ZOX:[id number]  Admit date - 01-29-19   Admitting Physician Starleen Armsawood S Ashten Prats, MD  Outpatient Primary MD for the patient is Lance BoschWarshaw, Gregg A, MD  LOS - 3   No chief complaint on file.      Brief Narrative    73 y.o. male, with past medical history of CVA, dementia, seizures, schizophrenia, interstitial lung disease on CellCept, COPD, baseline on 4 L nasal cannula, patient [facility] [place], which has COVID-19 outbreak, was [facility] emergency department because of concern of psychiatric illness, patient reports he is feeling going crazy, but he was noted to have fever 100.2, tachycardic, chest x-ray significant for multifocal pneumonia, his COVID-19 test came back positive, so [facility].   Subjective:    Mason May today reports he is feeling better, dyspnea at baseline.  Reports taking over, patient had soft blood pressure overnight which did respond to fluid bolus.  Assessment  & Plan :    Active Problems:   Seizure (HCC)   Schizo affective schizophrenia (HCC)   Chronic respiratory failure with hypoxia (HCC)   Pneumonia   Hypothyroidism   Interstitial lung disease (HCC)   COVID-19 virus infection   COVID-19   Pressure injury of skin   Pneumonia due to Covid 19 infection -Patient was febrile, tachycardic upon presentation to ED, his COVID-19 facility, sister reports he has been having symptoms for 2 weeks. -Did receive Actemra 01-29-19 -He received IV steroids, he was on prednisone today, will discontinue steroids today. -Patient with history of COPD/tissue lung disease lung  disease( on Cellicept)  and chronic respiratory failure on 4 L nasal cannula at baseline, he is at the very high risk for decompensation, he will need close monitoring. -He received Lasix yesterday, but blood pressure has been soft, so we will hold on Lasix  Interstitial lung disease -Continue with CellCept, will need close monitoring in the setting of COVID-19 infection, he has no increased oxygen equipments.  Hypotension -Blood pressure is soft, this point of fluid bolus overnight, I will hold on continuing IV fluids, will start on low-dose Midodrin.  Acute metabolic encephalopathy -This most likely in the setting of infectious process, -Today patient was more awake, today he is more sleepy, I think this is most likely after resuming his Acacian, I will decrease his Xanax, will decrease his Zyprexa, Seroquel as well.Marland Kitchen  COPD and chronic hypoxic respiratory failure -At baseline, on 4 L nasal cannula, continue with home medication including Advair, Combivent as well.  Schizophrenia and schizoaffective disorder,/hallucination -Continue with Bernette Mayers, he is more lethargic today, I will decrease his Seroquel, Zyprexa, and Xanax.  History of seizures -Continue with Keppra  Hypothyroidism -Continue with home dose levothyroxine  Hyperlipidemia -Continue with statin  BPH -Continue with Flomax  Hypokalemia/hypomagnesemia -Repleted, recheck in am.  Stage 2 sacral pressure ulcer       COVID-19 Labs  Recent Labs    08/07/18 0313 08/08/18 0126 08/09/18 0342  DDIMER 1.18* 0.62* 0.60*  FERRITIN 675* 571* 603*  CRP 8.4* 5.1* 1.5*    Lab Results  Component Value Date   SARSCOV2NAA POSITIVE (A) 08/16/2018     Code Status : DNR  Family Communication  : D/W sister 08/08/2018, will call today  Disposition Plan  : back to SNF when stable  Consults  :  None  Procedures  : None  DVT Prophylaxis  :  Mikes lovenox  Lab Results  Component Value Date   PLT  258 08/09/2018    Antibiotics  :    Anti-infectives (From admission, onward)   Start     Dose/Rate Route Frequency Ordered Stop   08/08/18 2000  azithromycin (ZITHROMAX) tablet 500 mg  Status:  Discontinued     500 mg Oral Every 24 hours 08/08/18 1309 08/09/18 1055   08/16/18 1930  azithromycin (ZITHROMAX) 500 mg in sodium chloride 0.9 % 250 mL IVPB  Status:  Discontinued     500 mg 250 mL/hr over 60 Minutes Intravenous Every 24 hours 08-16-18 1928 08/08/18 1309        Objective:   Vitals:   08/09/18 0552 08/09/18 0651 08/09/18 0800 08/09/18 1236  BP: 95/60 (!) 99/54 (!) 91/58   Pulse: 99 92 100   Resp:    18  Temp:    99.8 F (37.7 C)  TempSrc:    Axillary  SpO2: 96% 96% 94%   Weight:      Height:        Wt Readings from Last 3 Encounters:  08/07/18 87.4 kg  08-16-18 84.4 kg  01/25/17 92.5 kg     Intake/Output Summary (Last 24 hours) at 08/09/2018 1411 Last data filed at 08/09/2018 0859 Gross per 24 hour  Intake 950 ml  Output 1075 ml  Net -125 ml     Physical Exam  Awake, drowsy today, but wakes up and answer questions, follow commands. Symmetrical Chest wall movement, diminished air entry, no wheezing RRR,No Gallops,Rubs or new Murmurs, No Parasternal Heave +ve B.Sounds, Abd Soft, No tenderness, No rebound - guarding or rigidity. No Cyanosis, Clubbing , +1 edema, No new Rash or bruise      Data Review:    CBC Recent Labs  Lab 08-16-2018 1223 08-16-2018 1945 08/07/18 0313 08/08/18 0126 08/09/18 0342  WBC 5.2 4.0 2.9* 2.2* 4.4  HGB 12.9* 12.6* 12.1* 12.0* 12.0*  HCT 40.1 39.4 37.5* 36.7* 38.4*  PLT 200 194 194 225 258  MCV 91.6 94.5 92.8 91.8 92.8  MCH 29.5 30.2 30.0 30.0 29.0  MCHC 32.2 32.0 32.3 32.7 31.3  RDW 13.1 13.2 13.2 13.2 13.6  LYMPHSABS 1.1  --  0.2* 0.4* 1.0  MONOABS 0.6  --  0.1 0.3 0.6  EOSABS 0.0  --  0.0 0.0 0.0  BASOSABS 0.0  --  0.0 0.0 0.0    Chemistries  Recent Labs  Lab 02-Sep-2018 1223 02-Sep-2018 1945 08/07/18 0313  08/08/18 0126 08/09/18 0342  NA 140  --  140 142 142  K 3.3*  --  3.4* 4.1 3.2*  CL 100  --  101 104 100  CO2 28  --  29 30 32  GLUCOSE 90  --  139* 169* 90  BUN 11  --  9 9 15   CREATININE 0.38* 0.40* 0.31* 0.34* 0.40*  CALCIUM 8.5*  --  8.4* 8.7* 8.6*  MG  --   --  1.6* 2.1 1.9  AST 23  --  24 27 27   ALT 14  --  15 17 17   ALKPHOS 54  --  48 45 45  BILITOT 1.0  --  0.7 0.4 0.6   ------------------------------------------------------------------------------------------------------------------ No results for input(s): CHOL, HDL, LDLCALC, TRIG, CHOLHDL, LDLDIRECT in the last 72 hours.  No results found for: HGBA1C ------------------------------------------------------------------------------------------------------------------ No results for input(s): TSH, T4TOTAL, T3FREE, THYROIDAB in the last 72 hours.  Invalid input(s): FREET3 ------------------------------------------------------------------------------------------------------------------ Recent Labs    08/08/18 0126 08/09/18 0342  FERRITIN 571* 603*    Coagulation profile No results for input(s): INR, PROTIME in the last 168 hours.  Recent Labs    08/08/18 0126 08/09/18 0342  DDIMER 0.62* 0.60*    Cardiac Enzymes No results for input(s): CKMB, TROPONINI, MYOGLOBIN in the last 168 hours.  Invalid input(s): CK ------------------------------------------------------------------------------------------------------------------    Component Value Date/Time   BNP 11.0 01/25/2017 1221    Inpatient Medications  Scheduled Meds:  aspirin EC  81 mg Oral Daily   atorvastatin  20 mg Oral Daily   divalproex  500 mg Oral QHS   enoxaparin (LOVENOX) injection  40 mg Subcutaneous Q24H   feeding supplement (ENSURE ENLIVE)  237 mL Oral TID BM   Ipratropium-Albuterol  1 puff Inhalation QID   levothyroxine  50 mcg Oral Q0600   mometasone-formoterol  2 puff Inhalation BID   multivitamin with minerals  1 tablet Oral  Daily   mycophenolate  1,500 mg Oral BID   OLANZapine  2.5 mg Oral QHS   paliperidone  12 mg Oral q morning - 10a   pantoprazole  40 mg Oral Daily   potassium chloride  40 mEq Oral Q4H   [START ON 08/10/2018] QUEtiapine  25 mg Oral QHS   tamsulosin  0.4 mg Oral QPC supper   traZODone  50 mg Oral QHS   vitamin C  500 mg Oral Daily   zinc sulfate  220 mg Oral Daily   Continuous Infusions:  PRN Meds:.acetaminophen, ALPRAZolam  Micro Results Recent Results (from the past 240 hour(s))  Blood Culture (routine x 2)     Status: None (Preliminary result)   Collection Time: 2018-09-02 12:30 PM  Result Value Ref Range Status   Specimen Description BLOOD LEFT ARM  Final   Special Requests   Final    BOTTLES DRAWN AEROBIC AND ANAEROBIC Blood Culture adequate volume   Culture   Final    NO GROWTH 3 DAYS Performed at [facility], [address] [place], [place] [address]    Report Status PENDING  Incomplete  SARS  Coronavirus 2 [facility] order, Performed in [facility] hospital lab)     Status: Abnormal   Collection Time: 08/04/2018 12:30 PM  Result Value Ref Range Status   SARS Coronavirus 2 POSITIVE (A) NEGATIVE Final    Comment: RESULT CALLED TO, READ BACK BY AND VERIFIED WITH: KAILEY WALKER 07/27/2018 @ 1419  MLK (NOTE) If result is NEGATIVE SARS-CoV-2 target nucleic acids are NOT DETECTED. The SARS-CoV-2 RNA is generally detectable in upper and lower  respiratory specimens during the acute phase of infection. The lowest  concentration of SARS-CoV-2 viral copies this assay can detect is 250  copies / mL. A negative result does not preclude SARS-CoV-2 infection  and should not be used as the sole basis for treatment or other  patient management decisions.  A negative result may occur with  improper specimen collection / handling, submission of specimen other  than nasopharyngeal swab, presence of viral mutation(s) within the  areas targeted by this assay, and inadequate  number of viral copies  (<250 copies / mL). A negative result must be combined with clinical  observations, patient history, and epidemiological information. If result is POSITIVE SARS-CoV-2 target nucleic acids are DETECTED. The  SARS-CoV-2 RNA is generally detectable in upper and lower  respiratory specimens during the acute phase of infection.  Positive  results are indicative of active infection with SARS-CoV-2.  Clinical  correlation with patient history and other diagnostic information is  necessary to determine patient infection status.  Positive results do  not rule out bacterial infection or co-infection with other viruses. If result is PRESUMPTIVE POSTIVE SARS-CoV-2 nucleic acids MAY BE PRESENT.   A presumptive positive result was obtained on the submitted specimen  and confirmed on repeat testing.  While 2019 novel coronavirus  (SARS-CoV-2) nucleic acids may be present in the submitted sample  additional confirmatory testing may be necessary for epidemiological  and / or clinical management purposes  to differentiate between  SARS-CoV-2 and other Sarbecovirus currently known to infect humans.  If clinically indicated additional testing with an alternate test  methodology [phone]) is a dvised. The SARS-CoV-2 RNA is generally  detectable in upper and lower respiratory specimens during the acute  phase of infection. The expected result is Negative. Fact Sheet for Patients:  BoilerBrush.com.cy Fact Sheet for Healthcare Providers: https://pope.com/ This test is not yet approved or cleared by the [place] FDA and has been authorized for detection and/or diagnosis of SARS-CoV-2 by FDA under an Emergency Use Authorization (EUA).  This EUA will remain in effect (meaning this test can be used) for the duration of the COVID-19 declaration under Section 564(b)(1) of the Act, 21 U.S.C. section 360bbb-3(b)(1), unless the  authorization is terminated or revoked sooner. Performed at [facility], [address] [place], [place] [address]     Radiology Reports Dg Chest [place] 1 View  Result Date: 08/02/2018 CLINICAL DATA:  Fever.  COVID-19 exposure EXAM: PORTABLE CHEST 1 VIEW COMPARISON:  01/25/2017 and CT chest 01/25/2018 FINDINGS: Chronic lung disease with scarring based on prior CT. Superimposed patchy airspace disease bilaterally is new compared with the prior study. No effusion. IMPRESSION: COPD with scarring Superimposed acute airspace disease bilaterally suspicious for pneumonia. Electronically Signed   By: Marlan Palau M.D.   On: 07/26/2018 12:57    Time Spent in minutes  25 minutes   Huey Bienenstock M.D on 08/09/2018 at 2:11 PM  Between 7am to 7pm - Pager - [phone]  After 7pm go to [email or web address] - password [place]  [facility] -  Office  [phone]                                          PROGRESS NOTE                                                                                                                                                                                                             Patient Demographics:    Mason May, is a 73 y.o. male, DOB - May 20, 1945, BMW:[id number]  Admit date - 08/20/2018   Admitting Physician Starleen Arms, MD  Outpatient Primary MD for the patient is Lance Bosch, MD  LOS - 3   No chief complaint on file.      Brief Narrative    73 y.o. male, with past medical history of CVA, dementia, seizures, schizophrenia, interstitial lung disease on CellCept, COPD, baseline on 4 L nasal cannula, patient is a resident at [facility], [place], which has COVID-19 outbreak, was sent to [facility] emergency department because of concern of psychiatric illness, patient reports he is feeling going crazy, but he was noted to have fever 100.2, tachycardic, chest x-ray significant for multifocal pneumonia, his COVID-19 test  came back positive, so patient transferred to [facility] for further care.   Subjective:    Mason May today reports he is feeling better, dyspnea at baseline.  Reports he is feeling better, he remains afebrile.   Assessment  & Plan :    Active Problems:   Seizure (HCC)   Schizo affective schizophrenia (HCC)   Chronic respiratory failure with hypoxia (HCC)   Pneumonia   Hypothyroidism   Interstitial lung disease (HCC)   COVID-19 virus infection   COVID-19   Pressure injury of skin   Pneumonia due to Covid 19 infection -Patient was febrile, tachycardic upon presentation to ED, his COVID-19 facility, sister reports he has been having symptoms for 2 weeks. -Did receive Actemra 08/20/18 -He is on IV solumedrol 40 mg IV every 8 hours.  Will Change to p.o. prednisone from tomorrow. -Patient with history of COPD/tissue lung disease lung disease( on Cellicept)  and chronic respiratory failure on 4 L nasal cannula at baseline, he is at the very high risk for decompensation, he will need close monitoring. - will give 60 mg of lasix today.  Interstitial lung disease -Continue with CellCept, will need close monitoring in the setting of COVID-19 infection, he has  no increased oxygen equipments.  Acute metabolic encephalopathy -This most likely in the setting of infectious process, he is more awake and appropriate today .  COPD and chronic hypoxic respiratory failure -At baseline, on 4 L nasal cannula, continue with home medication including Advair, Combivent as well.  Schizophrenia and schizoaffective disorder,/hallucination -Resume home meds once confirmed by pharmacy  History of seizures -Continue with Keppra  Hypothyroidism -Continue with home dose levothyroxine  Hyperlipidemia -Continue with statin  BPH -Continue with Flomax  Hypokalemia/hypomagnesemia -Repleted,   Stage 2 sacral pressure ulcer      COVID-19 Labs  Recent Labs    08/07/18 0313 08/08/18 0126  08/09/18 0342  DDIMER 1.18* 0.62* 0.60*  FERRITIN 675* 571* 603*  CRP 8.4* 5.1* 1.5*    Lab Results  Component Value Date   SARSCOV2NAA POSITIVE (A) August 28, 2018     Code Status : DNR  Family Communication  : D/W sister 08/07/2018, will call today  Disposition Plan  : back to SNF when stable  Consults  :  None  Procedures  : None  DVT Prophylaxis  :  [place] lovenox  Lab Results  Component Value Date   PLT 258 08/09/2018    Antibiotics  :    Anti-infectives (From admission, onward)   Start     Dose/Rate Route Frequency Ordered Stop   08/08/18 2000  azithromycin (ZITHROMAX) tablet 500 mg  Status:  Discontinued     500 mg Oral Every 24 hours 08/08/18 1309 08/09/18 1055   08-28-18 1930  azithromycin (ZITHROMAX) 500 mg in sodium chloride 0.9 % 250 mL IVPB  Status:  Discontinued     500 mg 250 mL/hr over 60 Minutes Intravenous Every 24 hours 08/28/18 1928 08/08/18 1309        Objective:   Vitals:   08/09/18 0552 08/09/18 0651 08/09/18 0800 08/09/18 1236  BP: 95/60 (!) 99/54 (!) 91/58   Pulse: 99 92 100   Resp:    18  Temp:    99.8 F (37.7 C)  TempSrc:    Axillary  SpO2: 96% 96% 94%   Weight:      Height:        Wt Readings from Last 3 Encounters:  08/07/18 87.4 kg  08/28/2018 84.4 kg  01/25/17 92.5 kg     Intake/Output Summary (Last 24 hours) at 08/09/2018 1418 Last data filed at 08/09/2018 0859 Gross per 24 hour  Intake 950 ml  Output 1075 ml  Net -125 ml     Physical Exam  awake Alert, Oriented X 2, No new F.N deficits, more appropriate and conversant today Symmetrical Chest wall movement, Good air movement bilaterally, CTAB RRR,No Gallops,Rubs or new Murmurs, No Parasternal Heave +ve B.Sounds, Abd Soft, No tenderness, No rebound - guarding or rigidity. No Cyanosis, Clubbing ,+1 edema, No new Rash or bruise     Data Review:    CBC Recent Labs  Lab 08/28/18 1223 Aug 28, 2018 1945 08/07/18 0313 08/08/18 0126 08/09/18 0342  WBC 5.2 4.0 2.9* 2.2*  4.4  HGB 12.9* 12.6* 12.1* 12.0* 12.0*  HCT 40.1 39.4 37.5* 36.7* 38.4*  PLT 200 194 194 225 258  MCV 91.6 94.5 92.8 91.8 92.8  MCH 29.5 30.2 30.0 30.0 29.0  MCHC 32.2 32.0 32.3 32.7 31.3  RDW 13.1 13.2 13.2 13.2 13.6  LYMPHSABS 1.1  --  0.2* 0.4* 1.0  MONOABS 0.6  --  0.1 0.3 0.6  EOSABS 0.0  --  0.0 0.0 0.0  BASOSABS 0.0  --  0.0 0.0 0.0    Chemistries  Recent Labs  Lab 07/25/2018 1223 08/15/2018 1945 08/07/18 0313 08/08/18 0126 08/09/18 0342  NA 140  --  140 142 142  K 3.3*  --  3.4* 4.1 3.2*  CL 100  --  101 104 100  CO2 28  --  29 30 32  GLUCOSE 90  --  139* 169* 90  BUN 11  --  9 9 15   CREATININE 0.38* 0.40* 0.31* 0.34* 0.40*  CALCIUM 8.5*  --  8.4* 8.7* 8.6*  MG  --   --  1.6* 2.1 1.9  AST 23  --  24 27 27   ALT 14  --  15 17 17   ALKPHOS 54  --  48 45 45  BILITOT 1.0  --  0.7 0.4 0.6   ------------------------------------------------------------------------------------------------------------------ No results for input(s): CHOL, HDL, LDLCALC, TRIG, CHOLHDL, LDLDIRECT in the last 72 hours.  No results found for: HGBA1C ------------------------------------------------------------------------------------------------------------------ No results for input(s): TSH, T4TOTAL, T3FREE, THYROIDAB in the last 72 hours.  Invalid input(s): FREET3 ------------------------------------------------------------------------------------------------------------------ Recent Labs    08/08/18 0126 08/09/18 0342  FERRITIN 571* 603*    Coagulation profile No results for input(s): INR, PROTIME in the last 168 hours.  Recent Labs    08/08/18 0126 08/09/18 0342  DDIMER 0.62* 0.60*    Cardiac Enzymes No results for input(s): CKMB, TROPONINI, MYOGLOBIN in the last 168 hours.  Invalid input(s): CK ------------------------------------------------------------------------------------------------------------------    Component Value Date/Time   BNP 11.0 01/25/2017 1221     Inpatient Medications  Scheduled Meds:  aspirin EC  81 mg Oral Daily   atorvastatin  20 mg Oral Daily   divalproex  500 mg Oral QHS   enoxaparin (LOVENOX) injection  40 mg Subcutaneous Q24H   feeding supplement (ENSURE ENLIVE)  237 mL Oral TID BM   Ipratropium-Albuterol  1 puff Inhalation QID   levothyroxine  50 mcg Oral Q0600   mometasone-formoterol  2 puff Inhalation BID   multivitamin with minerals  1 tablet Oral Daily   mycophenolate  1,500 mg Oral BID   OLANZapine  2.5 mg Oral QHS   paliperidone  12 mg Oral q morning - 10a   pantoprazole  40 mg Oral Daily   potassium chloride  40 mEq Oral Q4H   [START ON 08/10/2018] QUEtiapine  25 mg Oral QHS   tamsulosin  0.4 mg Oral QPC supper   traZODone  50 mg Oral QHS   vitamin C  500 mg Oral Daily   zinc sulfate  220 mg Oral Daily   Continuous Infusions:  PRN Meds:.acetaminophen, ALPRAZolam  Micro Results Recent Results (from the past 240 hour(s))  Blood Culture (routine x 2)     Status: None (Preliminary result)   Collection Time: 08/23/2018 12:30 PM  Result Value Ref Range Status   Specimen Description BLOOD LEFT ARM  Final   Special Requests   Final    BOTTLES DRAWN AEROBIC AND ANAEROBIC Blood Culture adequate volume   Culture   Final    NO GROWTH 3 DAYS Performed at [facility], [address] [place], [place] [address]    Report Status PENDING  Incomplete  SARS Coronavirus 2 [facility] order, Performed in [facility] hospital lab)     Status: Abnormal   Collection Time: 08/19/2018 12:30 PM  Result Value Ref Range Status   SARS Coronavirus 2 POSITIVE (A) NEGATIVE Final    Comment: RESULT CALLED TO, READ BACK BY AND VERIFIED WITH: Ailene Ards 08/19/2018 @  1419  MLK (NOTE) If result is NEGATIVE SARS-CoV-2 target nucleic acids are NOT DETECTED. The SARS-CoV-2 RNA is generally detectable in upper and lower  respiratory specimens during the acute phase of infection. The lowest  concentration  of SARS-CoV-2 viral copies this assay can detect is 250  copies / mL. A negative result does not preclude SARS-CoV-2 infection  and should not be used as the sole basis for treatment or other  patient management decisions.  A negative result may occur with  improper specimen collection / handling, submission of specimen other  than nasopharyngeal swab, presence of viral mutation(s) within the  areas targeted by this assay, and inadequate number of viral copies  (<250 copies / mL). A negative result must be combined with clinical  observations, patient history, and epidemiological information. If result is POSITIVE SARS-CoV-2 target nucleic acids are DETECTED. The  SARS-CoV-2 RNA is generally detectable in upper and lower  respiratory specimens during the acute phase of infection.  Positive  results are indicative of active infection with SARS-CoV-2.  Clinical  correlation with patient history and other diagnostic information is  necessary to determine patient infection status.  Positive results do  not rule out bacterial infection or co-infection with other viruses. If result is PRESUMPTIVE POSTIVE SARS-CoV-2 nucleic acids MAY BE PRESENT.   A presumptive positive result was obtained on the submitted specimen  and confirmed on repeat testing.  While 2019 novel coronavirus  (SARS-CoV-2) nucleic acids may be present in the submitted sample  additional confirmatory testing may be necessary for epidemiological  and / or clinical management purposes  to differentiate between  SARS-CoV-2 and other Sarbecovirus currently known to infect humans.  If clinically indicated additional testing with an alternate test  methodology [phone]) is a dvised. The SARS-CoV-2 RNA is generally  detectable in upper and lower respiratory specimens during the acute  phase of infection. The expected result is Negative. Fact Sheet for Patients:  BoilerBrush.com.cy Fact Sheet for Healthcare  Providers: https://pope.com/ This test is not yet approved or cleared by the [place] FDA and has been authorized for detection and/or diagnosis of SARS-CoV-2 by FDA under an Emergency Use Authorization (EUA).  This EUA will remain in effect (meaning this test can be used) for the duration of the COVID-19 declaration under Section 564(b)(1) of the Act, 21 U.S.C. section 360bbb-3(b)(1), unless the authorization is terminated or revoked sooner. Performed at [facility], [address] [place], [place] [address]     Radiology Reports Dg Chest [place] 1 View  Result Date: 08/17/2018 CLINICAL DATA:  Fever.  COVID-19 exposure EXAM: PORTABLE CHEST 1 VIEW COMPARISON:  01/25/2017 and CT chest 01/25/2018 FINDINGS: Chronic lung disease with scarring based on prior CT. Superimposed patchy airspace disease bilaterally is new compared with the prior study. No effusion. IMPRESSION: COPD with scarring Superimposed acute airspace disease bilaterally suspicious for pneumonia. Electronically Signed   By: Marlan Palau M.D.   On: 08/20/2018 12:57    Time Spent in minutes  35 minutes   Huey Bienenstock M.D on 08/09/2018 at 2:18 PM  Between 7am to 7pm - Pager - [phone]  After 7pm go to [email or web address] - password [facility]  [facility] -  Office  [phone]

## 2018-08-09 NOTE — Progress Notes (Signed)
Pt has tolerated bolus per order. Most recent BP 99/54 MAP 67 @ 0651. UOP WDL. Currently sleeping.

## 2018-08-09 NOTE — Progress Notes (Signed)
Text paged: [facility] Night MD 7p-7a   C. Joseph Art MD [phone]    CGV 164 R.Nienhaus. Order to notify for sbp <90, dbp<60. FYI: 0000 06/52, 0130 102/62, 0330 87/51, 0400 93/64. Thx- Alvester Morin, RN  Above was copied and pasted from text page sent.

## 2018-08-09 NOTE — Progress Notes (Signed)
Spoke with Dr. Joseph Art. Order received to bolus 1L NS. Will carryout order.

## 2018-08-09 NOTE — Progress Notes (Signed)
Updated patients sister with plan of care for patient.

## 2018-08-10 LAB — COMPREHENSIVE METABOLIC PANEL
ALT: 17 U/L (ref 0–44)
AST: 28 U/L (ref 15–41)
Albumin: 3.1 g/dL — ABNORMAL LOW (ref 3.5–5.0)
Alkaline Phosphatase: 48 U/L (ref 38–126)
Anion gap: 8 (ref 5–15)
BUN: 16 mg/dL (ref 8–23)
CO2: 29 mmol/L (ref 22–32)
Calcium: 8.5 mg/dL — ABNORMAL LOW (ref 8.9–10.3)
Chloride: 105 mmol/L (ref 98–111)
Creatinine, Ser: 0.38 mg/dL — ABNORMAL LOW (ref 0.61–1.24)
GFR calc Af Amer: >60 mL/min (ref >60)
GFR calc non Af Amer: >60 mL/min (ref >60)
Glucose, Bld: 110 mg/dL — ABNORMAL HIGH (ref 70–99)
Potassium: 4 mmol/L (ref 3.5–5.1)
Sodium: 142 mmol/L (ref 135–145)
Total Bilirubin: 0.4 mg/dL (ref 0.3–1.2)
Total Protein: 5.9 g/dL — ABNORMAL LOW (ref 6.5–8.1)

## 2018-08-10 LAB — QUANTIFERON-TB GOLD PLUS (RQFGPL)
QuantiFERON Mitogen Value: 1.54 IU/mL
QuantiFERON Nil Value: 0.04 IU/mL
QuantiFERON TB1 Ag Value: 0.05 IU/mL
QuantiFERON TB2 Ag Value: 0.05 IU/mL

## 2018-08-10 LAB — CBC WITH DIFFERENTIAL/PLATELET
Abs Immature Granulocytes: 0.1 10*3/uL — ABNORMAL HIGH (ref 0.00–0.07)
Basophils Absolute: 0 10*3/uL (ref 0.0–0.1)
Basophils Relative: 0 %
Eosinophils Absolute: 0 10*3/uL (ref 0.0–0.5)
Eosinophils Relative: 0 %
HCT: 37.9 % — ABNORMAL LOW (ref 39.0–52.0)
Hemoglobin: 12.2 g/dL — ABNORMAL LOW (ref 13.0–17.0)
Immature Granulocytes: 3 %
Lymphocytes Relative: 21 %
Lymphs Abs: 0.6 10*3/uL — ABNORMAL LOW (ref 0.7–4.0)
MCH: 30.3 pg (ref 26.0–34.0)
MCHC: 32.2 g/dL (ref 30.0–36.0)
MCV: 94 fL (ref 80.0–100.0)
Monocytes Absolute: 0.5 10*3/uL (ref 0.1–1.0)
Monocytes Relative: 17 %
Neutro Abs: 1.8 10*3/uL (ref 1.7–7.7)
Neutrophils Relative %: 59 %
Platelets: 236 10*3/uL (ref 150–400)
RBC: 4.03 MIL/uL — ABNORMAL LOW (ref 4.22–5.81)
RDW: 13.9 % (ref 11.5–15.5)
WBC: 3.1 10*3/uL — ABNORMAL LOW (ref 4.0–10.5)
nRBC: 0 % (ref 0.0–0.2)

## 2018-08-10 LAB — D-DIMER, QUANTITATIVE: D-Dimer, Quant: 0.94 ug/mL-FEU — ABNORMAL HIGH (ref 0.00–0.50)

## 2018-08-10 LAB — MAGNESIUM
Magnesium: 2 mg/dL (ref 1.7–2.4)
Magnesium: 1.9 mg/dL (ref 1.7–2.4)

## 2018-08-10 LAB — QUANTIFERON-TB GOLD PLUS: QuantiFERON-TB Gold Plus: NEGATIVE

## 2018-08-10 LAB — C-REACTIVE PROTEIN: CRP: 0.9 mg/dL (ref <1.0)

## 2018-08-10 LAB — FERRITIN: Ferritin: 623 ng/mL — ABNORMAL HIGH (ref 24–336)

## 2018-08-10 MED ORDER — RESOURCE THICKENUP CLEAR PO POWD
ORAL | Status: DC | PRN
  Filled 2018-08-10 (×2): qty 125

## 2018-08-10 MED ORDER — QUETIAPINE FUMARATE 50 MG PO TABS
50.0000 mg | ORAL_TABLET | Freq: Two times a day (BID) | ORAL | Status: DC
  Administered 2018-08-10 – 2018-08-16 (×13): 50 mg via ORAL
  Filled 2018-08-10 (×14): qty 1

## 2018-08-10 MED ORDER — FUROSEMIDE 10 MG/ML IJ SOLN
20.0000 mg | Freq: Once | INTRAMUSCULAR | Status: AC
  Administered 2018-08-10: 18:00:00 20 mg via INTRAVENOUS
  Filled 2018-08-10: qty 2

## 2018-08-10 MED ORDER — OLANZAPINE 5 MG PO TABS
5.0000 mg | ORAL_TABLET | Freq: Every day | ORAL | Status: DC
  Administered 2018-08-10 – 2018-08-16 (×7): 5 mg via ORAL
  Filled 2018-08-10 (×7): qty 1

## 2018-08-10 MED ORDER — ALPRAZOLAM 0.5 MG PO TABS
0.5000 mg | ORAL_TABLET | Freq: Three times a day (TID) | ORAL | Status: DC | PRN
  Administered 2018-08-10 – 2018-08-12 (×4): 0.5 mg via ORAL
  Filled 2018-08-10 (×4): qty 1

## 2018-08-10 MED ORDER — METOPROLOL TARTRATE 5 MG/5ML IV SOLN
2.5000 mg | INTRAVENOUS | Status: DC | PRN
  Administered 2018-08-10: 5 mg via INTRAVENOUS
  Filled 2018-08-10: qty 5

## 2018-08-10 NOTE — Progress Notes (Addendum)
Attempted to contact patient family to update.  No answer. Will try again   Sister returned phone call.  She was concerned because patient hasn't been feeling well past two days and also expressed concerns as far as medication management.  Will notify MD

## 2018-08-10 NOTE — Progress Notes (Signed)
Text paged Dr. Joseph Art to notify that patient coughs every time he drinks clear liquids. May benefit from swallow study. Patient is also demanding that his medications be adjusted back to his home dosing. Notified Dr. Joseph Art of that as well.

## 2018-08-10 NOTE — [place] FL2 (Signed)
[place] MEDICAID FL2 LEVEL OF CARE SCREENING TOOL     IDENTIFICATION  Patient Name: Mason May Birthdate: 03/31/1945 Sex: male Admission Date (Current Location): 08/02/2018  [facility] and [place] Number:  [employer] and Address:  [facility] [place], [address], [place], [place] [address]([facility])      Provider Number: [id number]  Attending Physician Name and Address:  Tyrone Nine, MD  Relative Name and Phone Number:  Cherylann Ratel (sister) [phone]    Current Level of Care: Hospital Recommended Level of Care: Skilled Nursing Facility Prior Approval Number:    Date Approved/Denied:   PASRR Number: [id number]  Discharge Plan: SNF    Current Diagnoses: Patient Active Problem List   Diagnosis Date Noted  . Pressure injury of skin 08/08/2018  . COVID-19 virus infection 08/05/2018  . COVID-19 07/25/2018  . Chronic respiratory failure with hypoxia (HCC) 03/31/2016  . Pneumonia 03/31/2016  . Hypothyroidism 03/31/2016  . Interstitial lung disease (HCC) 03/31/2016  . Unresponsive episode   . Schizo affective schizophrenia (HCC) 03/27/2016  . Sepsis (HCC) 03/26/2016  . Seizure (HCC) 03/26/2016    Orientation RESPIRATION BLADDER Height & Weight     Self  O2(nasal cannula 2L/min) Incontinent, External catheter Weight: 192 lb 10.9 oz (87.4 kg) Height:  6' (182.9 cm)  BEHAVIORAL SYMPTOMS/MOOD NEUROLOGICAL BOWEL NUTRITION STATUS      Continent Diet(see discharge summary)  AMBULATORY STATUS COMMUNICATION OF NEEDS Skin   Extensive Assist Verbally Skin abrasions, PU Stage and Appropriate Care(abrasion buttocks and toe, MASD Groin and perimeum, skin tear coccyx, pressure injry stage II coccyx, pressure injury stage I scrotum)                       Personal Care Assistance Level of Assistance  Bathing, Feeding, Dressing, Total care Bathing Assistance: Maximum assistance Feeding assistance: Maximum assistance Dressing Assistance:  Maximum assistance Total Care Assistance: Maximum assistance   Functional Limitations Info  Sight, Hearing, Speech Sight Info: Adequate(R eye red with drainage) Hearing Info: Adequate Speech Info: Impaired(limited speach, slight difficulty in volume)    SPECIAL CARE FACTORS FREQUENCY  PT (By licensed PT), OT (By licensed OT)     PT Frequency: min 5x weekly OT Frequency: min 5x weekly            Contractures Contractures Info: Not present    Additional Factors Info  Code Status, Allergies Code Status Info: DNR Allergies Info: no known allergies           Current Medications (08/10/2018):  This is the current hospital active medication list Current Facility-Administered Medications  Medication Dose Route Frequency Provider Last Rate Last Dose  . acetaminophen (TYLENOL) tablet 650 mg  650 mg Oral Q6H PRN Elgergawy, Leana Roe, MD   650 mg at 08/10/18 0134  . ALPRAZolam Prudy Feeler) tablet 0.25 mg  0.25 mg Oral TID PRN Elgergawy, Leana Roe, MD   0.25 mg at 08/10/18 0134  . aspirin EC tablet 81 mg  81 mg Oral Daily Elgergawy, Leana Roe, MD   81 mg at 08/10/18 1104  . atorvastatin (LIPITOR) tablet 20 mg  20 mg Oral Daily Elgergawy, Leana Roe, MD   20 mg at 08/10/18 1104  . divalproex (DEPAKOTE) DR tablet 500 mg  500 mg Oral QHS Elgergawy, Leana Roe, MD   500 mg at 08/09/18 2242  . enoxaparin (LOVENOX) injection 40 mg  40 mg Subcutaneous Q24H Elgergawy, Leana Roe, MD   40 mg at  08/09/18 1938  . feeding supplement (ENSURE ENLIVE) (ENSURE ENLIVE) liquid 237 mL  237 mL Oral TID BM Elgergawy, Leana Roeawood S, MD   237 mL at 08/09/18 1939  . Ipratropium-Albuterol (COMBIVENT) respimat 1 puff  1 puff Inhalation QID Elgergawy, Leana Roeawood S, MD   1 puff at 08/10/18 1105  . levothyroxine (SYNTHROID) tablet 50 mcg  50 mcg Oral Q0600 Elgergawy, Leana Roeawood S, MD   50 mcg at 08/10/18 [phone]  . midodrine (PROAMATINE) tablet 5 mg  5 mg Oral BID WC Elgergawy, Leana Roeawood S, MD   5 mg at 08/10/18 1106  . mometasone-formoterol  (DULERA) 100-5 MCG/ACT inhaler 2 puff  2 puff Inhalation BID Elgergawy, Leana Roeawood S, MD   2 puff at 08/10/18 1106  . multivitamin with minerals tablet 1 tablet  1 tablet Oral Daily Elgergawy, Leana Roeawood S, MD   1 tablet at 08/10/18 1104  . mycophenolate (CELLCEPT) capsule 1,500 mg  1,500 mg Oral BID Elgergawy, Leana Roeawood S, MD   1,500 mg at 08/10/18 1107  . OLANZapine (ZYPREXA) tablet 2.5 mg  2.5 mg Oral QHS Elgergawy, Leana Roeawood S, MD   2.5 mg at 08/09/18 2241  . paliperidone (INVEGA) 24 hr tablet 12 mg  12 mg Oral q morning - 10a Elgergawy, Leana Roeawood S, MD   12 mg at 08/10/18 1109  . pantoprazole (PROTONIX) EC tablet 40 mg  40 mg Oral Daily Elgergawy, Leana Roeawood S, MD   40 mg at 08/10/18 1104  . QUEtiapine (SEROQUEL) tablet 25 mg  25 mg Oral BID Elgergawy, Leana Roeawood S, MD   25 mg at 08/10/18 1108  . tamsulosin (FLOMAX) capsule 0.4 mg  0.4 mg Oral QPC supper Elgergawy, Leana Roeawood S, MD   0.4 mg at 08/09/18 1645  . traZODone (DESYREL) tablet 50 mg  50 mg Oral QHS Elgergawy, Leana Roeawood S, MD   50 mg at 08/09/18 2241  . vitamin C (ASCORBIC ACID) tablet 500 mg  500 mg Oral Daily Elgergawy, Leana Roeawood S, MD   500 mg at 08/10/18 1104  . zinc sulfate capsule 220 mg  220 mg Oral Daily Elgergawy, Leana Roeawood S, MD   220 mg at 08/10/18 1104     Discharge Medications: Please see discharge summary for a list of discharge medications.  Relevant Imaging Results:  Relevant Lab Results:   Additional Information SSN: [id number]  Gildardo GriffesAshley M Kadrian Partch, LCSW

## 2018-08-10 NOTE — Progress Notes (Signed)
PROGRESS NOTE  Mason May  ZOX:[id number] DOB: Jan 24, 1946 DOA: 07/31/2018 PCP: Lance Bosch, MD   Brief Narrative: 73 y.o.male,with past medical history of CVA, dementia, seizures, schizophrenia, interstitial lung disease on CellCept, COPD, baseline on 4 L nasal cannula, patient is a resident at [facility] [place], which has COVID-19 outbreak, was sent to [facility] emergency department because of concern of psychiatric illness, patient reports he is feeling going crazy, but he was noted to have fever 100.2, tachycardic, chest x-ray significant for multifocal pneumonia, his COVID-19 test came back positive, so patient transferred to [facility] for further care.  Assessment & Plan: Active Problems:   Seizure (HCC)   Schizo affective schizophrenia (HCC)   Chronic respiratory failure with hypoxia (HCC)   Pneumonia   Hypothyroidism   Interstitial lung disease (HCC)   COVID-19 virus infection   COVID-19   Pressure injury of skin  Acute on chronic hypoxic respiratory failure due to covid-19 superimposed on COPD, ILD:  - Continue supplemental oxygen as needed to maintain SpO2 >90% - Continue cellcept and bronchodilators.   Pneumonia due to Covid 19 infection: Patient was febrile, tachycardic upon presentation to ED, his COVID-19 facility, sister reports he has been having symptoms for 2 weeks. - Did receive Actemra 08/03/2018, and received steroids briefly. Inflammatory markers showing significant improvement. - With this patient being ~2 weeks since symptom onset and decreasing inflammatory markers without worsened hypoxia, anticipate he'll be ready for discharge medically in the next 24-48 hours.   Hypotension: Improving.  - Attempt diuresis today. Midodrine started previously. Will try to wean going forward.   Acute metabolic encephalopathy: This most likely in the setting of infectious process, - Continue home psychotropic medications at reduced doses as he is more alert with  tapering. Continue   Schizophrenia and schizoaffective disorder,/hallucination - Continue with invega, zyprexa, seroquel, alprazolam.   History of seizures - Continue with depakote  Hypothyroidism - Continue with home dose levothyroxine  Hyperlipidemia - Continue with statin  BPH - Continue with flomax  Hypokalemia/hypomagnesemia - Repleted, recheck in am.  Stage2 sacral pressure ulcer: POA - Offload as able.  DVT prophylaxis: Lovenox Code Status: DNR Family Communication: Sister at bedside Disposition Plan: Return to SNF once tests negative.  Consultants:   None  Procedures:   None  Antimicrobials:  Azithromycin   Subjective: Feels he's withdrawing from psychiatric medications. Denies any hallucinations or troubling thoughts. Feels short of breath but no pain and on his normal level of oxygen.   Objective: Vitals:   08/10/18 1100 08/10/18 1200 08/10/18 1400 08/10/18 1600  BP:  123/72    Pulse: (!) 122 (!) 120 (!) 116   Resp:      Temp:  98.5 F (36.9 C)  98.8 F (37.1 C)  TempSrc:      SpO2: 94% 94% 96%   Weight:      Height:        Intake/Output Summary (Last 24 hours) at 08/10/2018 1659 Last data filed at 08/10/2018 0700 Gross per 24 hour  Intake 560 ml  Output 350 ml  Net 210 ml   Filed Weights   08/02/2018 1900 08/07/18 0451  Weight: 87.6 kg 87.4 kg    Gen: 73 y.o. male in no distress Pulm: Non-labored breathing supplemental oxygen. Clear to auscultation bilaterally.  CV: Regular tachycardia. No murmur, rub, or gallop. No JVD, no pedal edema. GI: Abdomen soft, non-tender, non-distended, with normoactive bowel sounds. No organomegaly or masses felt. Ext: Warm, no deformities Skin: No rashes,  lesions or ulcers Neuro: Alert, partially oriented. No focal neurological deficits. Psych: Judgement and insight appear impaired. Mood & affect appropriate.   Data Reviewed: I have personally reviewed following labs and imaging studies  CBC:  Recent Labs  Lab 2018-08-09 1223 09-Aug-2018 1945 08/07/18 0313 08/08/18 0126 08/09/18 0342 08/10/18 0305  WBC 5.2 4.0 2.9* 2.2* 4.4 3.1*  NEUTROABS 3.5  --  2.5 1.4* 2.7 1.8  HGB 12.9* 12.6* 12.1* 12.0* 12.0* 12.2*  HCT 40.1 39.4 37.5* 36.7* 38.4* 37.9*  MCV 91.6 94.5 92.8 91.8 92.8 94.0  PLT 200 194 194 225 258 236   Basic Metabolic Panel: Recent Labs  Lab 08-09-18 1223 08-09-2018 1945 08/07/18 0313 08/08/18 0126 08/09/18 0342 08/10/18 0305  NA 140  --  140 142 142 142  K 3.3*  --  3.4* 4.1 3.2* 4.0  CL 100  --  101 104 100 105  CO2 28  --  29 30 32 29  GLUCOSE 90  --  139* 169* 90 110*  BUN 11  --  CREATININE 0.38* 0.40* 0.31* 0.34* 0.40* 0.38*  CALCIUM 8.5*  --  8.4* 8.7* 8.6* 8.5*  MG  --   --  1.6* 2.1 1.9 2.0   GFR: Estimated Creatinine Clearance: 94.3 mL/min (A) (by C-G formula based on SCr of 0.38 mg/dL (L)). Liver Function Tests: Recent Labs  Lab 08/09/18 1223 08/07/18 0313 08/08/18 0126 08/09/18 0342 08/10/18 0305  AST ALT ALKPHOS 54 48 45 45 48  BILITOT 1.0 0.7 0.4 0.6 0.4  PROT 6.4* 6.3* 5.9* 6.0* 5.9*  ALBUMIN 3.2* 3.1* 2.9* 3.2* 3.1*   No results for input(s): LIPASE, AMYLASE in the last 168 hours. No results for input(s): AMMONIA in the last 168 hours. Coagulation Profile: No results for input(s): INR, PROTIME in the last 168 hours. Cardiac Enzymes: No results for input(s): CKTOTAL, CKMB, CKMBINDEX, TROPONINI in the last 168 hours. BNP (last 3 results) No results for input(s): PROBNP in the last 8760 hours. HbA1C: No results for input(s): HGBA1C in the last 72 hours. CBG: Recent Labs  Lab 08/09/18 0809  GLUCAP 78   Lipid Profile: No results for input(s): CHOL, HDL, LDLCALC, TRIG, CHOLHDL, LDLDIRECT in the last 72 hours. Thyroid Function Tests: No results for input(s): TSH, T4TOTAL, FREET4, T3FREE, THYROIDAB in the last 72 hours. Anemia Panel: Recent Labs    08/09/18 0342 08/10/18 0305   FERRITIN 603* 623*   Urine analysis:    Component Value Date/Time   COLORURINE YELLOW (A) 03/26/2016 1120   APPEARANCEUR CLEAR (A) 03/26/2016 1120   LABSPEC 1.024 03/26/2016 1120   PHURINE 5.0 03/26/2016 1120   GLUCOSEU NEGATIVE 03/26/2016 1120   HGBUR MODERATE (A) 03/26/2016 1120   BILIRUBINUR NEGATIVE 03/26/2016 1120   KETONESUR 80 (A) 03/26/2016 1120   PROTEINUR 100 (A) 03/26/2016 1120   NITRITE NEGATIVE 03/26/2016 1120   LEUKOCYTESUR NEGATIVE 03/26/2016 1120   Recent Results (from the past 240 hour(s))  Blood Culture (routine x 2)     Status: None (Preliminary result)   Collection Time: 2018-08-09 12:30 PM  Result Value Ref Range Status   Specimen Description BLOOD LEFT ARM  Final   Special Requests   Final    BOTTLES DRAWN AEROBIC AND ANAEROBIC Blood Culture adequate volume   Culture   Final    NO GROWTH 4 DAYS Performed at [facility], [address], [place], [place] [address]  Report Status PENDING  Incomplete  SARS Coronavirus 2 [facility](Hospital order, Performed in [facility] hospital lab)     Status: Abnormal   Collection Time: 04/19/2018 12:30 PM  Result Value Ref Range Status   SARS Coronavirus 2 POSITIVE (A) NEGATIVE Final    Comment: RESULT CALLED TO, READ BACK BY AND VERIFIED WITH: KAILEY WALKER 07-31-2018 @ 1419  MLK (NOTE) If result is NEGATIVE SARS-CoV-2 target nucleic acids are NOT DETECTED. The SARS-CoV-2 RNA is generally detectable in upper and lower  respiratory specimens during the acute phase of infection. The lowest  concentration of SARS-CoV-2 viral copies this assay can detect is 250  copies / mL. A negative result does not preclude SARS-CoV-2 infection  and should not be used as the sole basis for treatment or other  patient management decisions.  A negative result may occur with  improper specimen collection / handling, submission of specimen other  than nasopharyngeal swab, presence of viral mutation(s) within the  areas targeted by this  assay, and inadequate number of viral copies  (<250 copies / mL). A negative result must be combined with clinical  observations, patient history, and epidemiological information. If result is POSITIVE SARS-CoV-2 target nucleic acids are DETECTED. The  SARS-CoV-2 RNA is generally detectable in upper and lower  respiratory specimens during the acute phase of infection.  Positive  results are indicative of active infection with SARS-CoV-2.  Clinical  correlation with patient history and other diagnostic information is  necessary to determine patient infection status.  Positive results do  not rule out bacterial infection or co-infection with other viruses. If result is PRESUMPTIVE POSTIVE SARS-CoV-2 nucleic acids MAY BE PRESENT.   A presumptive positive result was obtained on the submitted specimen  and confirmed on repeat testing.  While 2019 novel coronavirus  (SARS-CoV-2) nucleic acids may be present in the submitted sample  additional confirmatory testing may be necessary for epidemiological  and / or clinical management purposes  to differentiate between  SARS-CoV-2 and other Sarbecovirus currently known to infect humans.  If clinically indicated additional testing with an alternate test  methodology [phone] is a dvised. The SARS-CoV-2 RNA is generally  detectable in upper and lower respiratory specimens during the acute  phase of infection. The expected result is Negative. Fact Sheet for Patients:  BoilerBrush.com.cyhttps://www.fda.gov/media/136312/download Fact Sheet for Healthcare Providers: https://pope.com/https://www.fda.gov/media/136313/download This test is not yet approved or cleared by the [place] FDA and has been authorized for detection and/or diagnosis of SARS-CoV-2 by FDA under an Emergency Use Authorization (EUA).  This EUA will remain in effect (meaning this test can be used) for the duration of the COVID-19 declaration under Section 564(b)(1) of the Act, 21 U.S.C. section 360bbb-3(b)(1),  unless the authorization is terminated or revoked sooner. Performed at [facility], [address] [place], [place] [address]       Radiology Studies: No results found.  Scheduled Meds: . aspirin EC  81 mg Oral Daily  . atorvastatin  20 mg Oral Daily  . divalproex  500 mg Oral QHS  . enoxaparin (LOVENOX) injection  40 mg Subcutaneous Q24H  . feeding supplement (ENSURE ENLIVE)  237 mL Oral TID BM  . Ipratropium-Albuterol  1 puff Inhalation QID  . levothyroxine  50 mcg Oral Q0600  . midodrine  5 mg Oral BID WC  . mometasone-formoterol  2 puff Inhalation BID  . multivitamin with minerals  1 tablet Oral Daily  . mycophenolate  1,500 mg Oral BID  . OLANZapine  2.5 mg Oral QHS  . paliperidone  12 mg Oral q morning - 10a  . pantoprazole  40 mg Oral Daily  . QUEtiapine  25 mg Oral BID  . tamsulosin  0.4 mg Oral QPC supper  . traZODone  50 mg Oral QHS  . vitamin C  500 mg Oral Daily  . zinc sulfate  220 mg Oral Daily   Continuous Infusions:   LOS: 4 days   Time spent: 25 minutes.  Tyrone Nine, MD [facility] [email or web address] Password [facility] 08/10/2018, 4:59 PM

## 2018-08-10 NOTE — TOC Initial Note (Addendum)
Transition of Care [facility]) - Initial/Assessment Note    Patient Details  Name: Mason May MRN: [id number] Date of Birth: 1945/07/29  Transition of Care [facility]) CM/SW Contact:    Gildardo Griffes, [place] Phone Number: [phone] 08/10/2018, 2:12 PM  Clinical Narrative:                  CSW consulted with patient's sister Mason May regarding discharge planning. Mason May expressed concerns about returning to [facility]. CSW encouraged Mason May that [facility] is taking appropriate precautions for return of patient. When asked her specific concerns Mason May was vague and not able to communicate them. Mason May also reports she has heard this information from patient about his personal concerns, then states he is unable to speak very well .CSW notes patient is oriented X1 with history of severe schizophrenia. Mason May also reports patient has been living at [facility] for 2 years now and she thought this would be a good time to look into alternative placement options. CSW advised Mason May this is possibly the worst possible time, as options are very challenging and limited. Barriers to alternative placement not only include patient's COVID + test, but also his severe mental health issues and his long term care needs. Mason May reports understanding and CSW encouraged her to continue to speak with [facility] social worker regarding alternative placement in the future when facilities open up to accepting patients. CSW will continue to follow for discharge planning when patient medically stable.    CSW will fax patient out to other long term SNFs, not hopeful of bed offers at this time.   Expected Discharge Plan: Skilled Nursing Facility Barriers to Discharge: Continued Medical Work up   Patient Goals and CMS Choice   CMS Medicare.gov Compare Post Acute Care list provided to:: Patient Represenative (must comment)(Mason May) Choice offered to / list presented to : Sibling  Expected Discharge Plan and Services Expected Discharge Plan:  Skilled Nursing Facility     Post Acute Care Choice: Skilled Nursing Facility Living arrangements for the past 2 months: Skilled Nursing Facility                                      Prior Living Arrangements/Services Living arrangements for the past 2 months: Skilled Nursing Facility Lives with:: Self Patient language and need for interpreter reviewed:: Yes        Need for Family Participation in Patient Care: Yes (Comment) Care giver support system in place?: Yes (comment)   Criminal Activity/Legal Involvement Pertinent to Current Situation/Hospitalization: No - Comment as needed  Activities of Daily Living Home Assistive Devices/Equipment: None ADL Screening (condition at time of admission) Patient's cognitive ability adequate to safely complete daily activities?: No Is the patient deaf or have difficulty hearing?: No Does the patient have difficulty seeing, even when wearing glasses/contacts?: No Does the patient have difficulty concentrating, remembering, or making decisions?: No Patient able to express need for assistance with ADLs?: No Does the patient have difficulty dressing or bathing?: No Independently performs ADLs?: No Communication: Independent Dressing (OT): Needs assistance Is this a change from baseline?: Pre-admission baseline Grooming: Needs assistance Is this a change from baseline?: Pre-admission baseline Feeding: Needs assistance Is this a change from baseline?: Pre-admission baseline Bathing: Needs assistance Is this a change from baseline?: Pre-admission baseline Toileting: Needs assistance Is this a change from baseline?: Pre-admission baseline In/Out Bed: Dependent Is this a change from baseline?: Pre-admission  baseline Walks in Home: Dependent Is this a change from baseline?: Pre-admission baseline Does the patient have difficulty walking or climbing stairs?: Yes Weakness of Legs: Both Weakness of Arms/Hands: Both  Permission  Sought/Granted Permission sought to share information with : Case Manager, Magazine features editoracility Contact Representative, Family Supports Permission granted to share information with : Yes, Verbal Permission Granted  Share Information with NAME: Mason RatelJanis  Permission granted to share info w AGENCY: SNFs  Permission granted to share info w Relationship: sister  Permission granted to share info w Contact Information: [phone]  Emotional Assessment Appearance:: Other (Comment Required(unablet o assess, remote) Attitude/Demeanor/Rapport: Unable to Assess Affect (typically observed): Unable to Assess Orientation: : Oriented to Self Alcohol / Substance Use: Not Applicable Psych Involvement: No (comment)  Admission diagnosis:  COVID-19 VIRUS INFECTION Patient Active Problem List   Diagnosis Date Noted  . Pressure injury of skin 08/08/2018  . COVID-19 virus infection 07/27/2018  . COVID-19 07/28/2018  . Chronic respiratory failure with hypoxia (HCC) 03/31/2016  . Pneumonia 03/31/2016  . Hypothyroidism 03/31/2016  . Interstitial lung disease (HCC) 03/31/2016  . Unresponsive episode   . Schizo affective schizophrenia (HCC) 03/27/2016  . Sepsis (HCC) 03/26/2016  . Seizure (HCC) 03/26/2016   PCP:  Lance BoschWarshaw, Gregg A, MD Pharmacy:  No Pharmacies Listed    Social Determinants of Health (SDOH) Interventions    Readmission Risk Interventions No flowsheet data found.

## 2018-08-10 NOTE — Progress Notes (Signed)
CSW acknowledges consult of patient's sister not wanting patient to return to [facility]. CSW will follow up on potential alternative bed offers.   [place], [place] [phone]

## 2018-08-11 LAB — COMPREHENSIVE METABOLIC PANEL
ALT: 27 U/L (ref 0–44)
AST: 49 U/L — ABNORMAL HIGH (ref 15–41)
Albumin: 3.3 g/dL — ABNORMAL LOW (ref 3.5–5.0)
Alkaline Phosphatase: 46 U/L (ref 38–126)
Anion gap: 8 (ref 5–15)
BUN: 16 mg/dL (ref 8–23)
CO2: 30 mmol/L (ref 22–32)
Calcium: 8.7 mg/dL — ABNORMAL LOW (ref 8.9–10.3)
Chloride: 102 mmol/L (ref 98–111)
Creatinine, Ser: 0.41 mg/dL — ABNORMAL LOW (ref 0.61–1.24)
GFR calc Af Amer: >60 mL/min (ref >60)
GFR calc non Af Amer: >60 mL/min (ref >60)
Glucose, Bld: 101 mg/dL — ABNORMAL HIGH (ref 70–99)
Potassium: 3.7 mmol/L (ref 3.5–5.1)
Sodium: 140 mmol/L (ref 135–145)
Total Bilirubin: 0.4 mg/dL (ref 0.3–1.2)
Total Protein: 5.9 g/dL — ABNORMAL LOW (ref 6.5–8.1)

## 2018-08-11 LAB — CULTURE, BLOOD (ROUTINE X 2)
Culture: NO GROWTH
Special Requests: ADEQUATE

## 2018-08-11 LAB — MAGNESIUM: Magnesium: 1.8 mg/dL (ref 1.7–2.4)

## 2018-08-11 MED ORDER — GUAIFENESIN 100 MG/5ML PO SOLN
5.0000 mL | ORAL | Status: DC | PRN
  Administered 2018-08-11: 100 mg via ORAL
  Filled 2018-08-11: qty 15

## 2018-08-11 MED ORDER — SODIUM CHLORIDE 0.9 % IV SOLN
3.0000 g | Freq: Four times a day (QID) | INTRAVENOUS | Status: DC
  Administered 2018-08-11 – 2018-08-17 (×22): 3 g via INTRAVENOUS
  Filled 2018-08-11 (×25): qty 3

## 2018-08-11 MED ORDER — ONDANSETRON 4 MG PO TBDP
4.0000 mg | ORAL_TABLET | Freq: Three times a day (TID) | ORAL | Status: DC | PRN
  Administered 2018-08-11: 10:00:00 4 mg via ORAL
  Filled 2018-08-11: qty 1

## 2018-08-11 MED ORDER — IPRATROPIUM-ALBUTEROL 20-100 MCG/ACT IN AERS
1.0000 | INHALATION_SPRAY | Freq: Four times a day (QID) | RESPIRATORY_TRACT | Status: DC | PRN
  Administered 2018-08-11 – 2018-08-13 (×2): 1 via RESPIRATORY_TRACT

## 2018-08-11 MED ORDER — OXYCODONE HCL 5 MG PO TABS
2.5000 mg | ORAL_TABLET | ORAL | Status: DC | PRN
  Administered 2018-08-11 – 2018-08-12 (×3): 2.5 mg via ORAL
  Filled 2018-08-11 (×3): qty 1

## 2018-08-11 MED ORDER — FUROSEMIDE 10 MG/ML IJ SOLN
20.0000 mg | Freq: Two times a day (BID) | INTRAMUSCULAR | Status: DC
  Administered 2018-08-11 – 2018-08-12 (×2): 20 mg via INTRAVENOUS
  Filled 2018-08-11 (×2): qty 2

## 2018-08-11 NOTE — Progress Notes (Addendum)
PROGRESS NOTE  Mason May  UJW:[id number] DOB: 13-Sep-1945 DOA: 08/16/2018 PCP: Lance Bosch, MD   Brief Narrative: 73 y.o.male,with past medical history of CVA, dementia, seizures, schizophrenia, interstitial lung disease on CellCept, COPD, baseline on 4 L nasal cannula, patient is a resident at [facility] [place], which has COVID-19 outbreak, was sent to [facility] emergency department because of concern of psychiatric illness, patient reports he is feeling going crazy, but he was noted to have fever 100.2, tachycardic, chest x-ray significant for multifocal pneumonia, his COVID-19 test came back positive, so patient transferred to [facility] for further care.  Assessment & Plan: Active Problems:   Seizure (HCC)   Schizo affective schizophrenia (HCC)   Chronic respiratory failure with hypoxia (HCC)   Pneumonia   Hypothyroidism   Interstitial lung disease (HCC)   COVID-19 virus infection   COVID-19   Pressure injury of skin  Acute on chronic hypoxic respiratory failure due to covid-19 superimposed on COPD, ILD:  - Continue supplemental oxygen as needed to maintain SpO2 >90% - Continue cellcept and bronchodilators.  - Maintain negative balance, repeat lasix and monitor I/O, weights.  - Suspected aspiration 5/19. Will continue SLP efforts, start unasyn and repeat CXR in AM.  Pneumonia due to Covid 19 infection: Patient was febrile, tachycardic upon presentation to ED, his COVID-19 facility, sister reports he has been having symptoms for 2 weeks. - Did receive Actemra 08/05/2018, and received steroids briefly. Inflammatory markers showing significant improvement. - With this patient being ~2 weeks since symptom onset and decreasing inflammatory markers, will not continue steroids or repeat actemra.  Pleuritic chest pain:  - Tylenol, will add prn oxyIR as NSAIDs and tramadol are not options for this covid pt w/hx seizures.  Hypotension: Improving.  - DC midodrine which was started  while hospitalized.   Acute metabolic encephalopathy: This most likely in the setting of infectious process, - Continue home psychotropic medications at reduced doses as he is more alert with tapering. Continue   Schizophrenia and schizoaffective disorder,/hallucination - Continue with invega, zyprexa, seroquel, alprazolam.   History of seizures - Continue with depakote  Hypothyroidism:  - Recheck TSH in AM - Continue with home dose levothyroxine  Hyperlipidemia - Continue with statin  BPH - Continue with flomax  Hypokalemia/hypomagnesemia - Repleted, recheck in am.  Stage2 sacral pressure ulcer: POA - Offload as able.  DVT prophylaxis: Lovenox Code Status: DNR Family Communication: Sister by phone Disposition Plan: Return to SNF once clinically improving. Will not require negative testing.  Consultants:   None  Procedures:   None  Antimicrobials:  Azithromycin   Subjective: Feels worse, breathing poorly, still having constant nonproductive cough and feels some rib cage pain with coughing described as air bubbles inside the ribcage. Tylenol doesn't help this. Still with leg swelling and feels anxious, uncomfortable.   Objective: Vitals:   08/11/18 0400 08/11/18 0500 08/11/18 0700 08/11/18 0800  BP: 93/61     Pulse: (!) 119  (!) 121 (!) 118  Resp:      Temp: 99.8 F (37.7 C) 98.9 F (37.2 C)    TempSrc: Oral Oral    SpO2: 91%  (!) 87% 91%  Weight:      Height:        Intake/Output Summary (Last 24 hours) at 08/11/2018 0836 Last data filed at 08/11/2018 0442 Gross per 24 hour  Intake 460 ml  Output 1100 ml  Net -640 ml   Filed Weights   07/29/2018 1900 08/07/18 0451  Weight:  87.6 kg 87.4 kg   Gen: Frail 73yo male in no distress Pulm: Nonlabored tachypnea with 3.5L O2 at rest SpO2 86-92%. Limited auscultation due to frequwent nonproductive cough and poor airway clearance. No wheezing or crackles. CV: Regular tachycardia. No murmur, rub, or  gallop. No JVD, 2+ pitting dependent edema. GI: Abdomen soft, non-tender, non-distended, with normoactive bowel sounds.  Ext: Warm, no deformities Skin: No new rashes, lesions or ulcers on visualized skin. Neuro: Alert and oriented. No focal neurological deficits. Psych: Judgement and insight appear fair. Mood euthymic & affect congruent. Behavior is appropriate.    Data Reviewed: I have personally reviewed following labs and imaging studies  CBC: Recent Labs  Lab 2018/08/07 1223 07-Aug-2018 1945 08/07/18 0313 08/08/18 0126 08/09/18 0342 08/10/18 0305  WBC 5.2 4.0 2.9* 2.2* 4.4 3.1*  NEUTROABS 3.5  --  2.5 1.4* 2.7 1.8  HGB 12.9* 12.6* 12.1* 12.0* 12.0* 12.2*  HCT 40.1 39.4 37.5* 36.7* 38.4* 37.9*  MCV 91.6 94.5 92.8 91.8 92.8 94.0  PLT 200 194 194 225 258 236   Basic Metabolic Panel: Recent Labs  Lab 08/07/18 0313 08/08/18 0126 08/09/18 0342 08/10/18 0305 08/10/18 1944 08/11/18 0547  NA 140 142 142 142  --  140  K 3.4* 4.1 3.2* 4.0  --  3.7  CL 101 104 100 105  --  102  CO2 29 30 32 29  --  30  GLUCOSE 139* 169* 90 110*  --  101*  BUN 9 9 15 16   --  16  CREATININE 0.31* 0.34* 0.40* 0.38*  --  0.41*  CALCIUM 8.4* 8.7* 8.6* 8.5*  --  8.7*  MG 1.6* 2.1 1.9 2.0 1.9 1.8   GFR: Estimated Creatinine Clearance: 94.3 mL/min (A) (by C-G formula based on SCr of 0.41 mg/dL (L)). Liver Function Tests: Recent Labs  Lab 08/07/18 0313 08/08/18 0126 08/09/18 0342 08/10/18 0305 08/11/18 0547  AST 24 27 27 28  49*  ALT 15 17 17 17 27   ALKPHOS 48 45 45 48 46  BILITOT 0.7 0.4 0.6 0.4 0.4  PROT 6.3* 5.9* 6.0* 5.9* 5.9*  ALBUMIN 3.1* 2.9* 3.2* 3.1* 3.3*   No results for input(s): LIPASE, AMYLASE in the last 168 hours. No results for input(s): AMMONIA in the last 168 hours. Coagulation Profile: No results for input(s): INR, PROTIME in the last 168 hours. Cardiac Enzymes: No results for input(s): CKTOTAL, CKMB, CKMBINDEX, TROPONINI in the last 168 hours. BNP (last 3 results) No  results for input(s): PROBNP in the last 8760 hours. HbA1C: No results for input(s): HGBA1C in the last 72 hours. CBG: Recent Labs  Lab 08/09/18 0809  GLUCAP 78   Lipid Profile: No results for input(s): CHOL, HDL, LDLCALC, TRIG, CHOLHDL, LDLDIRECT in the last 72 hours. Thyroid Function Tests: No results for input(s): TSH, T4TOTAL, FREET4, T3FREE, THYROIDAB in the last 72 hours. Anemia Panel: Recent Labs    08/09/18 0342 08/10/18 0305  FERRITIN 603* 623*   Urine analysis:    Component Value Date/Time   COLORURINE YELLOW (A) 03/26/2016 1120   APPEARANCEUR CLEAR (A) 03/26/2016 1120   LABSPEC 1.024 03/26/2016 1120   PHURINE 5.0 03/26/2016 1120   GLUCOSEU NEGATIVE 03/26/2016 1120   HGBUR MODERATE (A) 03/26/2016 1120   BILIRUBINUR NEGATIVE 03/26/2016 1120   KETONESUR 80 (A) 03/26/2016 1120   PROTEINUR 100 (A) 03/26/2016 1120   NITRITE NEGATIVE 03/26/2016 1120   LEUKOCYTESUR NEGATIVE 03/26/2016 1120   Recent Results (from the past 240 hour(s))  Blood Culture (routine  x 2)     Status: None   Collection Time: 19-Aug-2018 12:30 PM  Result Value Ref Range Status   Specimen Description BLOOD LEFT ARM  Final   Special Requests   Final    BOTTLES DRAWN AEROBIC AND ANAEROBIC Blood Culture adequate volume   Culture   Final    NO GROWTH 5 DAYS Performed at [facility], [address] [place] [place], [place] [address]    Report Status 08/11/2018 FINAL  Final  SARS Coronavirus 2 [facility] order, Performed in [facility] hospital lab)     Status: Abnormal   Collection Time: 19-Aug-2018 12:30 PM  Result Value Ref Range Status   SARS Coronavirus 2 POSITIVE (A) NEGATIVE Final    Comment: RESULT CALLED TO, READ BACK BY AND VERIFIED WITH: KAILEY WALKER 2018-08-19 @ 1419  MLK (NOTE) If result is NEGATIVE SARS-CoV-2 target nucleic acids are NOT DETECTED. The SARS-CoV-2 RNA is generally detectable in upper and lower  respiratory specimens during the acute phase of infection. The lowest   concentration of SARS-CoV-2 viral copies this assay can detect is 250  copies / mL. A negative result does not preclude SARS-CoV-2 infection  and should not be used as the sole basis for treatment or other  patient management decisions.  A negative result may occur with  improper specimen collection / handling, submission of specimen other  than nasopharyngeal swab, presence of viral mutation(s) within the  areas targeted by this assay, and inadequate number of viral copies  (<250 copies / mL). A negative result must be combined with clinical  observations, patient history, and epidemiological information. If result is POSITIVE SARS-CoV-2 target nucleic acids are DETECTED. The  SARS-CoV-2 RNA is generally detectable in upper and lower  respiratory specimens during the acute phase of infection.  Positive  results are indicative of active infection with SARS-CoV-2.  Clinical  correlation with patient history and other diagnostic information is  necessary to determine patient infection status.  Positive results do  not rule out bacterial infection or co-infection with other viruses. If result is PRESUMPTIVE POSTIVE SARS-CoV-2 nucleic acids MAY BE PRESENT.   A presumptive positive result was obtained on the submitted specimen  and confirmed on repeat testing.  While 2019 novel coronavirus  (SARS-CoV-2) nucleic acids may be present in the submitted sample  additional confirmatory testing may be necessary for epidemiological  and / or clinical management purposes  to differentiate between  SARS-CoV-2 and other Sarbecovirus currently known to infect humans.  If clinically indicated additional testing with an alternate test  methodology [phone]) is a dvised. The SARS-CoV-2 RNA is generally  detectable in upper and lower respiratory specimens during the acute  phase of infection. The expected result is Negative. Fact Sheet for Patients:  BoilerBrush.com.cy Fact Sheet  for Healthcare Providers: https://pope.com/ This test is not yet approved or cleared by the [place] FDA and has been authorized for detection and/or diagnosis of SARS-CoV-2 by FDA under an Emergency Use Authorization (EUA).  This EUA will remain in effect (meaning this test can be used) for the duration of the COVID-19 declaration under Section 564(b)(1) of the Act, 21 U.S.C. section 360bbb-3(b)(1), unless the authorization is terminated or revoked sooner. Performed at [facility], [address], [place], [place] [address]       Radiology Studies: No results found.  Scheduled Meds: . aspirin EC  81 mg Oral Daily  . atorvastatin  20 mg Oral Daily  . divalproex  500 mg Oral  QHS  . enoxaparin (LOVENOX) injection  40 mg Subcutaneous Q24H  . feeding supplement (ENSURE ENLIVE)  237 mL Oral TID BM  . levothyroxine  50 mcg Oral Q0600  . mometasone-formoterol  2 puff Inhalation BID  . multivitamin with minerals  1 tablet Oral Daily  . mycophenolate  1,500 mg Oral BID  . OLANZapine  5 mg Oral QHS  . paliperidone  12 mg Oral q morning - 10a  . pantoprazole  40 mg Oral Daily  . QUEtiapine  50 mg Oral BID  . tamsulosin  0.4 mg Oral QPC supper  . traZODone  50 mg Oral QHS  . vitamin C  500 mg Oral Daily  . zinc sulfate  220 mg Oral Daily   Continuous Infusions:   LOS: 5 days   Time spent: 35 minutes.  Tyrone Nineyan B Grunz, MD [facility] [email or web address] Password [email or web address] 08/11/2018, 8:36 AM

## 2018-08-11 NOTE — Evaluation (Signed)
Clinical/Bedside Swallow Evaluation Patient Details  Name: Mason May MRN: [id number] Date of Birth: 1946/01/07  Today's Date: 08/11/2018 Time: SLP Start Time (ACUTE ONLY): 0955 SLP Stop Time (ACUTE ONLY): 1036 SLP Time Calculation (min) (ACUTE ONLY): 41 min  Past Medical History:  Past Medical History:  Diagnosis Date  . Cerebral vascular disease   . Dementia (HCC)   . Schizo affective schizophrenia (HCC)   . Seizures (HCC)    Past Surgical History: History reviewed. No pertinent surgical history. HPI:  Pt is a 73 yo male admitted from [facility] SNF due to concern for psychiatric illness (pt felt like he was going crazy). He was noted to have a fever, tachycardia, and CXR concerning for PNA. COVID test was positive. PMH includes: CVA, dementia, seizures, schizophrenia, COPD. Pt was observed to cough with thin liquids on 5/18, therefore SLP swallow evaluation was ordered.   Assessment / Plan / Recommendation Clinical Impression  Differential diagnosis of oropharyngeal swallow is limited during clinical assessment given baseline coughing that persists throughout intake. He has immediate, explosive coughing with prolonged recovery time after drinking thin liquids, suggestive of aspiration. Tolerance with nectar thick liquids and purees is more difficult to gauge, given less overt of a cough response. He has a lingual/labial tremor and anterior spillage is noted primarily to the R. He quickly starts to describe himself as feeling very full, with coughing becoming more frequent as trials continue. RN who had pt on previous date said that his baseline coughing is new today since suspected aspiration event yesteday. Given difficult clinical picture with concern for aspiration this admission, MBS may be indicated. Would favor giving him additional time before considering further testing given VS at rest (HR 122, SpO2 82-90%). For today, would recommend NPO status except for essential meds  crushed in puree. Will f/u closely to determine readiness for additional POs. SLP Visit Diagnosis: Dysphagia, unspecified (R13.10)    Aspiration Risk  Moderate aspiration risk    Diet Recommendation NPO except meds   Medication Administration: Crushed with puree    Other  Recommendations Oral Care Recommendations: Oral care QID   Follow up Recommendations Skilled Nursing facility      Frequency and Duration min 2x/week  2 weeks       Prognosis Prognosis for Safe Diet Advancement: Good Barriers to Reach Goals: Cognitive deficits      Swallow Study   General HPI: Pt is a 73 yo male admitted from [facility] SNF due to concern for psychiatric illness (pt felt like he was going crazy). He was noted to have a fever, tachycardia, and CXR concerning for PNA. COVID test was positive. PMH includes: CVA, dementia, seizures, schizophrenia, COPD. Pt was observed to cough with thin liquids on 5/18, therefore SLP swallow evaluation was ordered. Type of Study: Bedside Swallow Evaluation Previous Swallow Assessment: none in chart Diet Prior to this Study: Regular;Thin liquids Temperature Spikes Noted: No Respiratory Status: Nasal cannula History of Recent Intubation: No Behavior/Cognition: Alert;Cooperative;Requires cueing Oral Cavity Assessment: Within Functional Limits Oral Care Completed by SLP: No Oral Cavity - Dentition: Edentulous Self-Feeding Abilities: Total assist Patient Positioning: Upright in bed Baseline Vocal Quality: Normal Volitional Swallow: Able to elicit    Oral/Motor/Sensory Function Overall Oral Motor/Sensory Function: Other (comment)(labial/lingual tremor)   Ice Chips Ice chips: Not tested   Thin Liquid Thin Liquid: Impaired Presentation: Straw Oral Phase Impairments: Reduced labial seal Oral Phase Functional Implications: Right anterior spillage Pharyngeal  Phase Impairments: Cough - Immediate  Nectar Thick Nectar Thick Liquid: Impaired Presentation:  Straw Oral Phase Impairments: Reduced labial seal Pharyngeal Phase Impairments: Cough - Immediate;Cough - Delayed   Honey Thick Honey Thick Liquid: Not tested   Puree Puree: Impaired Presentation: Spoon Pharyngeal Phase Impairments: Cough - Delayed   Solid     Solid: Not tested      Virl AxeLaura P Fabiana Dromgoole 08/11/2018,11:21 AM  Ivar DrapeLaura Truett Mcfarlan, M.A. CCC-SLP Acute Herbalist[facility]abilitation Services Pager [phone] Office [phone]

## 2018-08-11 NOTE — Progress Notes (Signed)
Pharmacy Antibiotic Note  Mason May is a 73 y.o. male admitted on 09/05/18 with COVID-19 pneumonia.  Pharmacy has been consulted for Unasyn dosing for suspected aspiration event on 08/11/18.  Plan: Unasyn 3g IV q6h Dose adjustments unlikely, Pharmacy will monitor peripherally for changes in renal function  Height: 6' (182.9 cm) Weight: 192 lb 10.9 oz (87.4 kg) IBW/kg (Calculated) : 77.6  Temp (24hrs), Avg:98.9 F (37.2 C), Min:97.5 F (36.4 C), Max:99.8 F (37.7 C)  Recent Labs  Lab 05-Sep-2018 1223 September 05, 2018 1945 08/07/18 0313 08/08/18 0126 08/09/18 0342 08/10/18 0305 08/11/18 0547  WBC 5.2 4.0 2.9* 2.2* 4.4 3.1*  --   CREATININE 0.38* 0.40* 0.31* 0.34* 0.40* 0.38* 0.41*  LATICACIDVEN 0.7  --   --   --   --   --   --     Estimated Creatinine Clearance: 94.3 mL/min (A) (by C-G formula based on SCr of 0.41 mg/dL (L)).    No Known Allergies  Antimicrobials this admission: 5/19 Unasyn >>  Dose adjustments this admission: None  Microbiology results: COVID (+) 5/14 BCx 5/14 [facility]) >> NGF  Thank you for allowing pharmacy to be a part of this patient's care.  Loralee Pacas, PharmD, BCPS Pharmacy: [phone] 08/11/2018 7:54 PM

## 2018-08-11 NOTE — Progress Notes (Signed)
Patient exhibiting more signs of aspiration today compared to yesterday. More coughing, clearing throat. Patient able to take whole pills in applesauce best. Speech tx here but patient's oxygen and heart rate not stable enough for swallow study which is the next step for him. Will cont to monitor and hope for tomorrow swallow study.

## 2018-08-11 NOTE — Progress Notes (Signed)
Called the patient's sister Cherylann Ratel to notify her of the room change to 170. Questions were encouraged and answered.

## 2018-08-12 ENCOUNTER — Inpatient Hospital Stay (HOSPITAL_COMMUNITY): Payer: Medicare Other

## 2018-08-12 LAB — COMPREHENSIVE METABOLIC PANEL
ALT: 32 U/L (ref 0–44)
AST: 61 U/L — ABNORMAL HIGH (ref 15–41)
Albumin: 3 g/dL — ABNORMAL LOW (ref 3.5–5.0)
Alkaline Phosphatase: 44 U/L (ref 38–126)
Anion gap: 9 (ref 5–15)
BUN: 18 mg/dL (ref 8–23)
CO2: 31 mmol/L (ref 22–32)
Calcium: 8.5 mg/dL — ABNORMAL LOW (ref 8.9–10.3)
Chloride: 102 mmol/L (ref 98–111)
Creatinine, Ser: 0.4 mg/dL — ABNORMAL LOW (ref 0.61–1.24)
GFR calc Af Amer: >60 mL/min (ref >60)
GFR calc non Af Amer: >60 mL/min (ref >60)
Glucose, Bld: 76 mg/dL (ref 70–99)
Potassium: 3.6 mmol/L (ref 3.5–5.1)
Sodium: 142 mmol/L (ref 135–145)
Total Bilirubin: 0.4 mg/dL (ref 0.3–1.2)
Total Protein: 5.4 g/dL — ABNORMAL LOW (ref 6.5–8.1)

## 2018-08-12 LAB — CBC WITH DIFFERENTIAL/PLATELET
Abs Immature Granulocytes: 0.16 10*3/uL — ABNORMAL HIGH (ref 0.00–0.07)
Basophils Absolute: 0 10*3/uL (ref 0.0–0.1)
Basophils Relative: 1 %
Eosinophils Absolute: 0 10*3/uL (ref 0.0–0.5)
Eosinophils Relative: 1 %
HCT: 39.7 % (ref 39.0–52.0)
Hemoglobin: 12.3 g/dL — ABNORMAL LOW (ref 13.0–17.0)
Immature Granulocytes: 5 %
Lymphocytes Relative: 29 %
Lymphs Abs: 1 10*3/uL (ref 0.7–4.0)
MCH: 29.6 pg (ref 26.0–34.0)
MCHC: 31 g/dL (ref 30.0–36.0)
MCV: 95.4 fL (ref 80.0–100.0)
Monocytes Absolute: 0.3 10*3/uL (ref 0.1–1.0)
Monocytes Relative: 10 %
Neutro Abs: 1.9 10*3/uL (ref 1.7–7.7)
Neutrophils Relative %: 54 %
Platelets: 176 10*3/uL (ref 150–400)
RBC: 4.16 MIL/uL — ABNORMAL LOW (ref 4.22–5.81)
RDW: 14.2 % (ref 11.5–15.5)
WBC: 3.5 10*3/uL — ABNORMAL LOW (ref 4.0–10.5)
nRBC: 0.6 % — ABNORMAL HIGH (ref 0.0–0.2)

## 2018-08-12 LAB — C-REACTIVE PROTEIN: CRP: <0.8 mg/dL (ref <1.0)

## 2018-08-12 LAB — PROCALCITONIN: Procalcitonin: <0.1 ng/mL

## 2018-08-12 LAB — TSH: TSH: 0.554 u[IU]/mL (ref 0.350–4.500)

## 2018-08-12 MED ORDER — LEVOTHYROXINE SODIUM 100 MCG/5ML IV SOLN
25.0000 ug | Freq: Every day | INTRAVENOUS | Status: DC
  Administered 2018-08-13 – 2018-08-16 (×4): 25 ug via INTRAVENOUS
  Filled 2018-08-12 (×5): qty 5

## 2018-08-12 MED ORDER — MORPHINE SULFATE (PF) 2 MG/ML IV SOLN
2.0000 mg | INTRAVENOUS | Status: DC | PRN
  Administered 2018-08-12 – 2018-08-13 (×2): 2 mg via INTRAVENOUS
  Filled 2018-08-12 (×2): qty 1

## 2018-08-12 MED ORDER — LORAZEPAM 2 MG/ML IJ SOLN
0.5000 mg | INTRAMUSCULAR | Status: DC | PRN
  Administered 2018-08-13 (×2): 0.5 mg via INTRAVENOUS
  Filled 2018-08-12 (×2): qty 1

## 2018-08-12 NOTE — Progress Notes (Signed)
PROGRESS NOTE  Quentin OreRonald Jacot  ZOX:[id number] DOB: [birth year] DOA: 08/05/2018 PCP: Lance BoschWarshaw, Gregg A, MD   Brief Narrative: 73 y.o.male,with past medical history of CVA, dementia, seizures, schizophrenia, interstitial lung disease on CellCept, COPD, baseline on 4 L nasal cannula, [facility] [place], which has COVID-19 outbreak, was [facility] department because of concern of psychiatric illness, patient reports he is feeling going crazy, but he was noted to have fever 100.2, tachycardic, chest x-ray significant for multifocal pneumonia, his COVID-19 test came back positive, so patient [facility] for [facility] care.  Assessment & Plan: Active Problems:   Seizure (HCC)   Schizo affective schizophrenia (HCC)   Chronic respiratory failure with hypoxia (HCC)   Pneumonia   Hypothyroidism   Interstitial lung disease (HCC)   COVID-19 virus infection   COVID-19   Pressure injury of skin  Acute on chronic hypoxic respiratory failure due to covid-19 superimposed on COPD, ILD:  - Continue supplemental oxygen as needed to maintain SpO2 >90% - Continue cellcept and bronchodilators.  - Maintain negative balance, repeat lasix and monitor I/O, weights.   Aspiration pneumonitis, pneumonia: - Suspected aspiration 5/19. Will continue SLP efforts, started unasyn and repeat CXR in AM. - Change to IV medications as able, treat anxiety which is playing a role in respiratory distress.  Pneumonia due to Covid 19 infection: Patient was febrile, tachycardic upon presentation to ED, his COVID-19 facility, sister reports he has been having symptoms for 2 weeks. - Did receive Actemra 08/19/2018, and received steroids briefly. Inflammatory markers showing significant improvement, CRP normal. - With this patient being ~2 weeks since symptom onset and decreasing inflammatory markers, will not continue steroids or repeat actemra.  Pleuritic chest pain:  - Tylenol,  NSAIDs and tramadol are not options for this covid pt w/hx seizures.  Hypotension: Improving.  - DC midodrine which was started while hospitalized.   Acute metabolic encephalopathy: This most likely in the setting of infectious process, - Continue home psychotropic medications at reduced doses as he is more alert with tapering. Continue   Schizophrenia and schizoaffective disorder,/hallucination - Continue with invega, zyprexa, seroquel. Substitute xanax with ativan IV prn  History of seizures - Continue with depakote  Hypothyroidism: TSH wnl - Continue with home dose levothyroxine changed to IV  Hyperlipidemia - Continue with statin  BPH - Continue with flomax  Hypokalemia/hypomagnesemia - Repleted, recheck in am.  Stage2 sacral pressure ulcer: POA - Offload as able.  DVT prophylaxis: Lovenox Code Status: DNR Family Communication: Sister called, no answer, no voicemail set up. Disposition Plan: Uncertain, guarded prognosis.   Consultants:   None  Procedures:   None  Antimicrobials:  Azithromycin   Subjective: Oxygen requirement up to 15L, thirsty but recognizes the risk of po intake right now outweighs benefit. Dyspnea is moderate, constant, worse with any exertion, frustrated by inability to keep mask on face.    Objective: Vitals:   08/12/18 0219 08/12/18 0300 08/12/18 0553 08/12/18 0844  BP: 107/68 101/65 (!) 139/119 116/68  Pulse:  99 (!) 127   Resp:      Temp:   98 F (36.7 C) 99.9 F (37.7 C)  TempSrc:   Axillary Axillary  SpO2: 99% 97% (!) 84%   Weight:      Height:        Intake/Output Summary (Last 24 hours) at 08/12/2018 1832 Last data filed at 08/12/2018 1700 Gross per 24 hour  Intake 323.89 ml  Output 200 ml  Net 123.89 ml   Filed Weights   09-Aug-2018 1900 08/07/18 0451  Weight: 87.6 kg 87.4 kg   Gen: Frail, elderly male in no distress Pulm: Nonlabored tachypnea with 15L NRB, maks difficult to remain on face due to TD. No  wheezes, coarse bilaterally. CV: Regular tachycardia. No murmur, rub, or gallop. No JVD, +stable dependent edema. GI: Abdomen soft, non-tender, non-distended, with normoactive bowel sounds.  Ext: Warm, no deformities Skin: No rashes, lesions or ulcers on visualized skin. Neuro: Alert and oriented. No focal neurological deficits. Psych: Judgement and insight appear fair. Mood anxious & affect congruent. Behavior is appropriate.    Data Reviewed: I have personally reviewed following labs and imaging studies  CBC: Recent Labs  Lab 08/07/18 0313 08/08/18 0126 08/09/18 0342 08/10/18 0305 08/12/18 0342  WBC 2.9* 2.2* 4.4 3.1* 3.5*  NEUTROABS 2.5 1.4* 2.7 1.8 1.9  HGB 12.1* 12.0* 12.0* 12.2* 12.3*  HCT 37.5* 36.7* 38.4* 37.9* 39.7  MCV 92.8 91.8 92.8 94.0 95.4  PLT 194 225 258 236 176   Basic Metabolic Panel: Recent Labs  Lab 08/08/18 0126 08/09/18 0342 08/10/18 0305 08/10/18 1944 08/11/18 0547 08/12/18 0342  NA 142 142 142  --  140 142  K 4.1 3.2* 4.0  --  3.7 3.6  CL 104 100 105  --  102 102  CO2 30 32 29  --  30 31  GLUCOSE 169* 90 110*  --  101* 76  BUN --  16 18  CREATININE 0.34* 0.40* 0.38*  --  0.41* 0.40*  CALCIUM 8.7* 8.6* 8.5*  --  8.7* 8.5*  MG 2.1 1.9 2.0 1.9 1.8  --    GFR: Estimated Creatinine Clearance: 94.3 mL/min (A) (by C-G formula based on SCr of 0.4 mg/dL (L)). Liver Function Tests: Recent Labs  Lab 08/08/18 0126 08/09/18 0342 08/10/18 0305 08/11/18 0547 08/12/18 0342  AST 49* 61*  ALT 32  ALKPHOS 45 45 48 46 44  BILITOT 0.4 0.6 0.4 0.4 0.4  PROT 5.9* 6.0* 5.9* 5.9* 5.4*  ALBUMIN 2.9* 3.2* 3.1* 3.3* 3.0*   No results for input(s): LIPASE, AMYLASE in the last 168 hours. No results for input(s): AMMONIA in the last 168 hours. Coagulation Profile: No results for input(s): INR, PROTIME in the last 168 hours. Cardiac Enzymes: No results for input(s): CKTOTAL, CKMB, CKMBINDEX, TROPONINI in the last 168 hours. BNP  (last 3 results) No results for input(s): PROBNP in the last 8760 hours. HbA1C: No results for input(s): HGBA1C in the last 72 hours. CBG: Recent Labs  Lab 08/09/18 0809  GLUCAP 78   Lipid Profile: No results for input(s): CHOL, HDL, LDLCALC, TRIG, CHOLHDL, LDLDIRECT in the last 72 hours. Thyroid Function Tests: Recent Labs    08/12/18 0342  TSH 0.554   Anemia Panel: Recent Labs    08/10/18 0305  FERRITIN 623*   Urine analysis:    Component Value Date/Time   COLORURINE YELLOW (A) 03/26/2016 1120   APPEARANCEUR CLEAR (A) 03/26/2016 1120   LABSPEC 1.024 03/26/2016 1120   PHURINE 5.0 03/26/2016 1120   GLUCOSEU NEGATIVE 03/26/2016 1120   HGBUR MODERATE (A) 03/26/2016 1120   BILIRUBINUR NEGATIVE 03/26/2016 1120   KETONESUR 80 (A) 03/26/2016 1120   PROTEINUR 100 (A) 03/26/2016 1120   NITRITE NEGATIVE 03/26/2016 1120   LEUKOCYTESUR NEGATIVE 03/26/2016 1120   Recent Results (from the past 240 hour(s))  Blood Culture (routine x 2)  Status: None   Collection Time: 08/05/2018 12:30 PM  Result Value Ref Range Status   Specimen Description BLOOD LEFT ARM  Final   Special Requests   Final    BOTTLES DRAWN AEROBIC AND ANAEROBIC Blood Culture adequate volume   Culture   Final    NO GROWTH 5 DAYS Performed at [facility], [address] [place] [address] [place], [place] [address]    Report Status 08/11/2018 FINAL  Final  SARS Coronavirus 2 [facility] order, Performed in [facility] hospital lab)     Status: Abnormal   Collection Time: 08/11/2018 12:30 PM  Result Value Ref Range Status   SARS Coronavirus 2 POSITIVE (A) NEGATIVE Final    Comment: RESULT CALLED TO, READ BACK BY AND VERIFIED WITH: KAILEY WALKER 08/05/2018 @ 1419  MLK (NOTE) If result is NEGATIVE SARS-CoV-2 target nucleic acids are NOT DETECTED. The SARS-CoV-2 RNA is generally detectable in upper and lower  respiratory specimens during the acute phase of infection. The lowest  concentration of SARS-CoV-2 viral copies  this assay can detect is 250  copies / mL. A negative result does not preclude SARS-CoV-2 infection  and should not be used as the sole basis for treatment or other  patient management decisions.  A negative result may occur with  improper specimen collection / handling, submission of specimen other  than nasopharyngeal swab, presence of viral mutation(s) within the  areas targeted by this assay, and inadequate number of viral copies  (<250 copies / mL). A negative result must be combined with clinical  observations, patient history, and epidemiological information. If result is POSITIVE SARS-CoV-2 target nucleic acids are DETECTED. The  SARS-CoV-2 RNA is generally detectable in upper and lower  respiratory specimens during the acute phase of infection.  Positive  results are indicative of active infection with SARS-CoV-2.  Clinical  correlation with patient history and other diagnostic information is  necessary to determine patient infection status.  Positive results do  not rule out bacterial infection or co-infection with other viruses. If result is PRESUMPTIVE POSTIVE SARS-CoV-2 nucleic acids MAY BE PRESENT.   A presumptive positive result was obtained on the submitted specimen  and confirmed on repeat testing.  While 2019 novel coronavirus  (SARS-CoV-2) nucleic acids may be present in the submitted sample  additional confirmatory testing may be necessary for epidemiological  and / or clinical management purposes  to differentiate between  SARS-CoV-2 and other Sarbecovirus currently known to infect humans.  If clinically indicated additional testing with an alternate test  methodology [phone]) is a dvised. The SARS-CoV-2 RNA is generally  detectable in upper and lower respiratory specimens during the acute  phase of infection. The expected result is Negative. Fact Sheet for Patients:  BoilerBrush.com.cy Fact Sheet for Healthcare Providers:  https://pope.com/ This test is not yet approved or cleared by the [place] FDA and has been authorized for detection and/or diagnosis of SARS-CoV-2 by FDA under an Emergency Use Authorization (EUA).  This EUA will remain in effect (meaning this test can be used) for the duration of the COVID-19 declaration under Section 564(b)(1) of the Act, 21 U.S.C. section 360bbb-3(b)(1), unless the authorization is terminated or revoked sooner. Performed at [facility], [address] [place], [place] [address]       Radiology Studies: Dg Chest [place] 1 View  Result Date: 08/12/2018 CLINICAL DATA:  Acute respiratory failure EXAM: PORTABLE CHEST 1 VIEW COMPARISON:  Six days ago FINDINGS: Low volume chest. There has been mild improvement in  opacity, especially about the right hilum. Reticular density at the right base is better seen, although this is also a more technically clear image. There is still extensive left-sided opacity with architectural distortion, scarring by 2018 chest CT. No edema, effusion, or pneumothorax. Stable heart size which is distorted by scarring. IMPRESSION: Mildly improved aeration. Background chronic lung disease with scarring and low volumes. Electronically Signed   By: Marnee Spring M.D.   On: 08/12/2018 05:07    Scheduled Meds: . divalproex  500 mg Oral QHS  . enoxaparin (LOVENOX) injection  40 mg Subcutaneous Q24H  . feeding supplement (ENSURE ENLIVE)  237 mL Oral TID BM  . [START ON 08/13/2018] levothyroxine  25 mcg Intravenous Daily  . mometasone-formoterol  2 puff Inhalation BID  . mycophenolate  1,500 mg Oral BID  . OLANZapine  5 mg Oral QHS  . paliperidone  12 mg Oral q morning - 10a  . pantoprazole  40 mg Oral Daily  . QUEtiapine  50 mg Oral BID  . tamsulosin  0.4 mg Oral QPC supper  . traZODone  50 mg Oral QHS   Continuous Infusions: . ampicillin-sulbactam (UNASYN) IV 200 mL/hr at 08/12/18 1700     LOS: 6 days    Time spent: 35 minutes.  Tyrone Nine, MD [facility] [email or web address] Password [facility] 08/12/2018, 6:32 PM

## 2018-08-12 NOTE — Progress Notes (Signed)
  Speech Language Pathology Treatment: Dysphagia  Patient Details Name: Mason May MRN: [id number] DOB: 1945/07/07 Today's Date: 08/12/2018 Time: 0277-4128 SLP Time Calculation (min) (ACUTE ONLY): 33 min  Assessment / Plan / Recommendation Clinical Impression  F/u dysphagia tx focused heavily on education today, with PO trials not attempted given tenuous respiratory status and high risk for aspiration. Today he is on 15L via NRB and still with persistent baseline cough. MD also present and providing education. We discussed multiple possible underlying etiologies contributing to his dysphagia (including COPD, dyskinesias, CVA), as well as reduced functional reserve to compensate in the setting of acute illness (COVID now with concern for aspiration PNA). We discussed potential negative outcomes of aspiration particularly given current respiratory status and the importance of oral care. The pt says that he does not want to try POs today because it is "too risky" although he does continue to request water. SLP set up suction in room to perform oral care, after which pt said he felt better. MD also reviewed medication list to determine what, if any, meds could transition from PO. For today, would remain NPO with emphasis on oral care to provide pt comfort while also keep his oral mucosa cleaner and less dry. For essential meds that must be given PO, would provide crushed in minimal amounts of puree. Will continue to follow closely. If he were to make improvements overall with current medical interventions, would recommend MBS before starting PO diet. Would also consider comfort feeds if he does not progress.    HPI HPI: Pt is a 73 yo male admitted from [facility] due to concern for psychiatric illness (pt felt like he was going crazy). He was noted to have a fever, tachycardia, and CXR concerning for PNA. COVID test was positive. PMH includes: CVA, dementia, seizures, schizophrenia, COPD. Pt was  observed to cough with thin liquids on 5/18, therefore SLP swallow evaluation was ordered.      SLP Plan  Continue with current plan of care       Recommendations  Diet recommendations: NPO Medication Administration: Crushed with puree(essential meds)                Oral Care Recommendations: Oral care QID Follow up Recommendations: Skilled Nursing facility SLP Visit Diagnosis: Dysphagia, unspecified (R13.10) Plan: Continue with current plan of care       GO                Virl Axe [name] 08/12/2018, 9:38 AM  Ivar Drape, M.A. CCC-SLP Acute Herbalist [phone] Office [phone]

## 2018-08-13 MED ORDER — OXYCODONE HCL 5 MG PO TABS: 5.0000 mg | ORAL_TABLET | ORAL | Status: DC | PRN

## 2018-08-13 NOTE — Progress Notes (Signed)
After speaking with Speech therapy today she is allowing him to have one ice cube at a time periodically today, No liquids as she would like to reevaluate tomorrow. Pt states he understands then when asked again fully does not. Pt has been turned and repositioned with skin care, small decub to coccyx with clean foam pad applied, heels are soft upon examine foam pads applied to bil heels. Pt also refused to wear his NRB, due to this he is on High flo nasal cannula at 15 L which he states feels better to him. Will continue to monitor

## 2018-08-13 NOTE — Progress Notes (Signed)
Nutrition Follow-up RD working remotely.  DOCUMENTATION CODES:   Not applicable  INTERVENTION:    If unable to safely advance PO diet, and if supportive care continues, recommend NG tube placement for TF.   Jevity 1.2 at goal rate of 70 ml/h (1680 ml per day) with Pro-stat 30 ml BID would provide 2216 kcal, 123 gm protein, 1361 ml free water daily   D/C Ensure while patient is NPO.  NUTRITION DIAGNOSIS:   Increased nutrient needs related to wound healing, acute illness as evidenced by estimated needs.  Ongoing  GOAL:   Patient will meet greater than or equal to 90% of their needs  Unmet  MONITOR:   Diet advancement, PO intake, Labs, Skin, I & O's  ASSESSMENT:    73 yo male with PMH of schizophrenia, CVD, seizures, dementia who was admitted from [facility] with COVID-19 infection.   Patient was made NPO on 5/19 due to suspected aspiration with coughing after eating/drinking. SLP is following. He had an episode of coughing and decreased oxygen sats this morning after receiving <1/2 tsp water with SLP. Remains NPO. Plans for MBS in the near future.  Labs and medications reviewed.   Diet Order:   Diet Order            Diet NPO time specified  Diet effective now              EDUCATION NEEDS:   No education needs have been identified at this time  Skin:  Skin Assessment: Skin Integrity Issues: Skin Integrity Issues:: Stage I, Stage II Stage I: L heel & scrotum Stage II: coccyx  Last BM:  5/19  Height:   Ht Readings from Last 1 Encounters:  07/26/2018 6' (1.829 m)    Weight:   Wt Readings from Last 1 Encounters:  08/07/18 87.4 kg    Ideal Body Weight:  80.9 kg  BMI:  Body mass index is 26.13 kg/m.  Estimated Nutritional Needs:   Kcal:  2100-2300  Protein:  115-130 gm  Fluid:  >/= 2.1 L    Joaquin Courts, RD, LDN, CNSC Pager [phone] After Hours Pager [phone]

## 2018-08-13 NOTE — Progress Notes (Signed)
  Speech Language Pathology Treatment: Dysphagia  Patient Details Name: Kari Lather MRN: [id number] DOB: [birth year] Today's Date: 08/13/2018 Time: 1000-1045 SLP Time Calculation (min) (ACUTE ONLY): 45 min  Assessment / Plan / Recommendation Clinical Impression  Pt remains on 15L via HFNC today but with pt reporting that he feels better and SpO2 better maintained above 90% at baseline. HR mildly reduced today as well, hovering closer to 110. After small amount of water (<1/2 tsp) pt had immediate, prolonged coughing, pt feeling SOB, and desaturation to 74%. RN present and switched him to NRB mask with return to up to 96%. Pt attributes this to the spoon being a "funny shape" and not being prepared for that. Education was provided on concern for aspiration. Ice chips were offered one at a time, with pt cued to hold them anteriorly in his mouth until they were melted. No further coughing or overt concern for aspiration was noted.   Per discussion with MD, will allow ice chips after oral care today with careful monitoring with plan for MBS in the near future (at the earliest, tomorrow) to determine safety for PO diet. Plan was discussed with the pt, who is eager for more POs today. He describes himself as "desperate" and wants soda, yogurt. SLP reminded him of conversation on previous date with MD about how much risk he was wanting to accept. At times he appears to comprehend information provided and following along, but then he will begin to ask for POs again. Will continue to follow closely and assist with options for least restrictive diet as able.   HPI HPI: Pt is a 73 yo male admitted from [facility] SN[facility] due to concern for psychiatric illness (pt felt like he was going crazy). He was noted to have a fever, tachycardia, and CXR concerning for PNA. COVID test was positive. PMH includes: CVA, dementia, seizures, schizophrenia, COPD. Pt was observed to cough with thin liquids on 5/18, therefore SLP  swallow evaluation was ordered.      SLP Plan  Continue with current plan of care;MBS       Recommendations  Diet recommendations: NPO;Other(comment)(ice chips, one at a time, after oral care) Medication Administration: Crushed with puree                Oral Care Recommendations: Oral care QID Follow up Recommendations: Skilled Nursing facility SLP Visit Diagnosis: Dysphagia, unspecified (R13.10) Plan: Continue with current plan of care;MBS       GO                Virl Axe Katiria Calame 08/13/2018, 11:25 AM  Ivar Drape, M.A. CCC-SLP Acute Herbalist [phone] Office [phone]

## 2018-08-13 NOTE — Progress Notes (Addendum)
PROGRESS NOTE  [employer]  TIR:[id number] DOB: May 26, 1945 DOA: 2018-08-14 PCP: Lance Bosch, MD   Brief Narrative: 73 y.o.male,with past medical history of CVA, dementia, seizures, schizophrenia, interstitial lung disease on CellCept, COPD, baseline on 4 L nasal cannula, patient is a resident at [facility], [place], which has COVID-19 outbreak, was sent to [facility] emergency department because of concern of psychiatric illness, patient reports he is feeling going crazy, but he was noted to have fever 100.2, tachycardic, chest x-ray significant for multifocal pneumonia, his COVID-19 test came back positive, so patient transferred to [facility] for further care.  Assessment & Plan: Active Problems:   Seizure (HCC)   Schizo affective schizophrenia (HCC)   Chronic respiratory failure with hypoxia (HCC)   Pneumonia   Hypothyroidism   Interstitial lung disease (HCC)   COVID-19 virus infection   COVID-19   Pressure injury of skin  Acute on chronic hypoxic respiratory failure due to covid-19 superimposed on COPD, ILD:  - Continue supplemental oxygen as needed to maintain SpO2 >90% - Continue cellcept and bronchodilators.  - Maintain negative balance, repeat lasix and monitor I/O, weights.   Aspiration pneumonitis, pneumonia: - Suspected aspiration 5/19. Will continue SLP efforts, started unasyn. PCT not elevated, CXR improving on my personal review today.  - Continue IV medication substitutions as able to minimize aspiration risk.  Pneumonia due to Covid 19 infection: Patient was febrile, tachycardic upon presentation to ED, his COVID-19 facility, sister reports he has been having symptoms for 2 weeks. - Did receive Actemra 08/14/2018, and received steroids briefly. Inflammatory markers showing significant improvement, CRP normal. - With this patient being ~2 weeks since symptom onset and decreasing inflammatory markers, will not continue steroids or repeat actemra.  Pleuritic chest pain:   - Tylenol, NSAIDs and tramadol are not options for this covid pt w/hx seizures.  Hypotension: Improving.  - DC midodrine which was started while hospitalized.   Acute metabolic encephalopathy: This most likely in the setting of infectious process, - Continue home psychotropic medications at reduced doses as he is more alert with tapering. Continue   Schizophrenia and schizoaffective disorder,/hallucination - Continue with invega, zyprexa, seroquel. Substitute xanax with ativan IV prn  History of seizures - Continue with depakote  Hypothyroidism: TSH wnl - Continue with home dose levothyroxine changed to IV  Hyperlipidemia - Continue with statin  BPH - Continue with flomax  Hypokalemia/hypomagnesemia - Repleted, recheck in am.  Stage2 sacral pressure ulcer: POA - Offload as able.  DVT prophylaxis: Lovenox Code Status: DNR Family Communication: Sister by phone today. Disposition Plan: Uncertain, continues to have a guarded prognosis.   Consultants:   None  Procedures:   None  Antimicrobials:  Azithromycin   Subjective: Oxygen requirement declining, wants to drink, feels a little better than yesterday. Poor sleeping last night. No chest pain   Objective: Vitals:   08/13/18 0800 08/13/18 0900 08/13/18 1015 08/13/18 1031  BP: 115/68     Pulse: (!) 115  (!) 111 (!) 113  Resp:      Temp: 99.4 F (37.4 C) 99.4 F (37.4 C)    TempSrc: Axillary Axillary    SpO2: 90%  (!) 89% 95%  Weight:      Height:        Intake/Output Summary (Last 24 hours) at 08/13/2018 1509 Last data filed at 08/13/2018 0612 Gross per 24 hour  Intake 223.89 ml  Output 1400 ml  Net -1176.11 ml   Filed Weights   2018/08/14 1900 08/07/18 0451  Weight: 87.6 kg 87.4 kg   Gen: Frail, elderly male in no distress Pulm: Nonlabored tachypnea with nasal cannula. No wheezes, coarse bilaterally. CV: Regular tachycardia. No murmur, rub, or gallop. No JVD, +stable, pitting dependent  edema. GI: Abdomen soft, non-tender, non-distended, with normoactive bowel sounds.  Ext: Warm, no deformities Skin: No rashes, lesions or ulcers on visualized skin. Neuro: Alert and oriented. No focal neurological deficits. Psych: Judgement and insight appear fair. Mood anxious & affect congruent. Behavior is appropriate.    Data Reviewed: I have personally reviewed following labs and imaging studies  CBC: Recent Labs  Lab 08/07/18 0313 08/08/18 0126 08/09/18 0342 08/10/18 0305 08/12/18 0342  WBC 2.9* 2.2* 4.4 3.1* 3.5*  NEUTROABS 2.5 1.4* 2.7 1.8 1.9  HGB 12.1* 12.0* 12.0* 12.2* 12.3*  HCT 37.5* 36.7* 38.4* 37.9* 39.7  MCV 92.8 91.8 92.8 94.0 95.4  PLT 194 225 258 236 176   Basic Metabolic Panel: Recent Labs  Lab 08/08/18 0126 08/09/18 0342 08/10/18 0305 08/10/18 1944 08/11/18 0547 08/12/18 0342  NA 142 142 142  --  140 142  K 4.1 3.2* 4.0  --  3.7 3.6  CL 104 100 105  --  102 102  CO2 30 32 29  --  30 31  GLUCOSE 169* 90 110*  --  101* 76  BUN --  16 18  CREATININE 0.34* 0.40* 0.38*  --  0.41* 0.40*  CALCIUM 8.7* 8.6* 8.5*  --  8.7* 8.5*  MG 2.1 1.9 2.0 1.9 1.8  --    GFR: Estimated Creatinine Clearance: 94.3 mL/min (A) (by C-G formula based on SCr of 0.4 mg/dL (L)). Liver Function Tests: Recent Labs  Lab 08/08/18 0126 08/09/18 0342 08/10/18 0305 08/11/18 0547 08/12/18 0342  AST 49* 61*  ALT 32  ALKPHOS 45 45 48 46 44  BILITOT 0.4 0.6 0.4 0.4 0.4  PROT 5.9* 6.0* 5.9* 5.9* 5.4*  ALBUMIN 2.9* 3.2* 3.1* 3.3* 3.0*   CBG: Recent Labs  Lab 08/09/18 0809  GLUCAP 78   Lipid Profile: No results for input(s): CHOL, HDL, LDLCALC, TRIG, CHOLHDL, LDLDIRECT in the last 72 hours. Thyroid Function Tests: Recent Labs    08/12/18 0342  TSH 0.554   Anemia Panel: No results for input(s): VITAMINB12, FOLATE, FERRITIN, TIBC, IRON, RETICCTPCT in the last 72 hours. Urine analysis:    Component Value Date/Time   COLORURINE YELLOW  (A) 03/26/2016 1120   APPEARANCEUR CLEAR (A) 03/26/2016 1120   LABSPEC 1.024 03/26/2016 1120   PHURINE 5.0 03/26/2016 1120   GLUCOSEU NEGATIVE 03/26/2016 1120   HGBUR MODERATE (A) 03/26/2016 1120   BILIRUBINUR NEGATIVE 03/26/2016 1120   KETONESUR 80 (A) 03/26/2016 1120   PROTEINUR 100 (A) 03/26/2016 1120   NITRITE NEGATIVE 03/26/2016 1120   LEUKOCYTESUR NEGATIVE 03/26/2016 1120   Recent Results (from the past 240 hour(s))  Blood Culture (routine x 2)     Status: None   Collection Time: 08/16/2018 12:30 PM  Result Value Ref Range Status   Specimen Description BLOOD LEFT ARM  Final   Special Requests   Final    BOTTLES DRAWN AEROBIC AND ANAEROBIC Blood Culture adequate volume   Culture   Final    NO GROWTH 5 DAYS Performed at [facility], [address] [place], [place] [address]    Report Status 08/11/2018 FINAL  Final  SARS Coronavirus 2 [facility] order, Performed in [facility] hospital lab)  Status: Abnormal   Collection Time: 08/01/2018 12:30 PM  Result Value Ref Range Status   SARS Coronavirus 2 POSITIVE (A) NEGATIVE Final    Comment: RESULT CALLED TO, READ BACK BY AND VERIFIED WITH: KAILEY WALKER 08/09/2018 @ 1419  MLK (NOTE) If result is NEGATIVE SARS-CoV-2 target nucleic acids are NOT DETECTED. The SARS-CoV-2 RNA is generally detectable in upper and lower  respiratory specimens during the acute phase of infection. The lowest  concentration of SARS-CoV-2 viral copies this assay can detect is 250  copies / mL. A negative result does not preclude SARS-CoV-2 infection  and should not be used as the sole basis for treatment or other  patient management decisions.  A negative result may occur with  improper specimen collection / handling, submission of specimen other  than nasopharyngeal swab, presence of viral mutation(s) within the  areas targeted by this assay, and inadequate number of viral copies  (<250 copies / mL). A negative result must be combined with  clinical  observations, patient history, and epidemiological information. If result is POSITIVE SARS-CoV-2 target nucleic acids are DETECTED. The  SARS-CoV-2 RNA is generally detectable in upper and lower  respiratory specimens during the acute phase of infection.  Positive  results are indicative of active infection with SARS-CoV-2.  Clinical  correlation with patient history and other diagnostic information is  necessary to determine patient infection status.  Positive results do  not rule out bacterial infection or co-infection with other viruses. If result is PRESUMPTIVE POSTIVE SARS-CoV-2 nucleic acids MAY BE PRESENT.   A presumptive positive result was obtained on the submitted specimen  and confirmed on repeat testing.  While 2019 novel coronavirus  (SARS-CoV-2) nucleic acids may be present in the submitted sample  additional confirmatory testing may be necessary for epidemiological  and / or clinical management purposes  to differentiate between  SARS-CoV-2 and other Sarbecovirus currently known to infect humans.  If clinically indicated additional testing with an alternate test  methodology [phone]) is a dvised. The SARS-CoV-2 RNA is generally  detectable in upper and lower respiratory specimens during the acute  phase of infection. The expected result is Negative. Fact Sheet for Patients:  BoilerBrush.com.cy Fact Sheet for Healthcare Providers: https://pope.com/ This test is not yet approved or cleared by the [place] FDA and has been authorized for detection and/or diagnosis of SARS-CoV-2 by FDA under an Emergency Use Authorization (EUA).  This EUA will remain in effect (meaning this test can be used) for the duration of the COVID-19 declaration under Section 564(b)(1) of the Act, 21 U.S.C. section 360bbb-3(b)(1), unless the authorization is terminated or revoked sooner. Performed at [facility], [address], [place], [place] [address]       Radiology Studies: Dg Chest [place] 1 View  Result Date: 08/12/2018 CLINICAL DATA:  Acute respiratory failure EXAM: PORTABLE CHEST 1 VIEW COMPARISON:  Six days ago FINDINGS: Low volume chest. There has been mild improvement in opacity, especially about the right hilum. Reticular density at the right base is better seen, although this is also a more technically clear image. There is still extensive left-sided opacity with architectural distortion, scarring by 2018 chest CT. No edema, effusion, or pneumothorax. Stable heart size which is distorted by scarring. IMPRESSION: Mildly improved aeration. Background chronic lung disease with scarring and low volumes. Electronically Signed   By: Marnee Spring M.D.   On: 08/12/2018 05:07    Scheduled Meds: . divalproex  500 mg Oral QHS  . enoxaparin (  LOVENOX) injection  40 mg Subcutaneous Q24H  . levothyroxine  25 mcg Intravenous Daily  . mometasone-formoterol  2 puff Inhalation BID  . mycophenolate  1,500 mg Oral BID  . OLANZapine  5 mg Oral QHS  . paliperidone  12 mg Oral q morning - 10a  . pantoprazole  40 mg Oral Daily  . QUEtiapine  50 mg Oral BID  . tamsulosin  0.4 mg Oral QPC supper  . traZODone  50 mg Oral QHS   Continuous Infusions: . ampicillin-sulbactam (UNASYN) IV 3 g (08/13/18 1457)     LOS: 7 days   Time spent: 25 minutes.  Tyrone Nineyan B Vincient Vanaman, MD [facility] [email or web address] Password [email or web address] 08/13/2018, 3:09 PM

## 2018-08-13 NOTE — Progress Notes (Signed)
Pt has tol occ ice chips well without swallowing difficulties. He continues to have his 02 at 15 L [place] per his request. Repositioned and is watching TV

## 2018-08-13 NOTE — Progress Notes (Signed)
Update given to Mason May pts sister this morning

## 2018-08-14 ENCOUNTER — Inpatient Hospital Stay (HOSPITAL_COMMUNITY): Payer: Medicare Other

## 2018-08-14 MED ORDER — FUROSEMIDE 10 MG/ML IJ SOLN
40.0000 mg | Freq: Once | INTRAMUSCULAR | Status: AC
  Administered 2018-08-14: 17:00:00 40 mg via INTRAVENOUS
  Filled 2018-08-14: qty 4

## 2018-08-14 MED ORDER — RESOURCE THICKENUP CLEAR PO POWD: ORAL | Status: DC | PRN

## 2018-08-14 MED ORDER — OXYMETAZOLINE HCL 0.05 % NA SOLN
1.0000 | Freq: Two times a day (BID) | NASAL | Status: DC
  Administered 2018-08-14 – 2018-08-16 (×6): 1 via NASAL
  Filled 2018-08-14: qty 15

## 2018-08-14 MED ORDER — MAGNESIUM SULFATE 2 GM/50ML IV SOLN
2.0000 g | Freq: Once | INTRAVENOUS | Status: AC
  Administered 2018-08-14: 2 g via INTRAVENOUS
  Filled 2018-08-14: qty 50

## 2018-08-14 MED ORDER — ALPRAZOLAM 0.5 MG PO TABS
0.5000 mg | ORAL_TABLET | Freq: Three times a day (TID) | ORAL | Status: DC | PRN
  Administered 2018-08-15 – 2018-08-16 (×3): 0.5 mg via ORAL
  Filled 2018-08-14 (×3): qty 1

## 2018-08-14 NOTE — Plan of Care (Signed)
  Problem: Clinical Measurements: Goal: Will remain free from infection Outcome: Progressing   Problem: Clinical Measurements: Goal: Respiratory complications will improve Outcome: Progressing   Problem: Nutrition: Goal: Adequate nutrition will be maintained Outcome: Not Progressing

## 2018-08-14 NOTE — Progress Notes (Signed)
Modified Barium Swallow Progress Note  Patient Details  Name: Mason May MRN: [id number] Date of Birth: [birth year]  Today's Date: 08/14/2018  Modified Barium Swallow completed.  Full report located under Chart Review in the Imaging Section.  Brief recommendations include the following:  Clinical Impression  Pt presents with a moderate oropharyngeal dysphagia with premature spillage of thin and nectar thick liquids over the base of tongue into the vestibule prior to an otherwise relatively timely and strong swallow initiation. Aspiration of thin liquids was sensed, but with nectar x2, silent with no response. Pt was able to achieve better bolus cohesion for transit with honey thick liquids. Cup sips were less coordinated as pt took several sips, pushing boluses to valleculae without initaiting a swallow. Recommend pt consume dys 2 solids (edentulous but able to masticate with gums) and honey thick liquids with a slow rate controlled by supervising staff member. One sip at a time or spoon feeding as needed.    Swallow Evaluation Recommendations       SLP Diet Recommendations: Dysphagia 2 (Fine chop) solids;Honey thick liquids   Liquid Administration via: Spoon;Cup   Medication Administration: Crushed with puree   Supervision: Full assist for feeding;Full supervision/cueing for compensatory strategies   Compensations: Slow rate;Small sips/bites   Postural Changes: Remain semi-upright after after feeds/meals (Comment);Seated upright at 90 degrees   Oral Care Recommendations: Oral care BID   Other Recommendations: Order thickener from pharmacy   Mason Ditty, MA CCC-SLP  Acute Rehabilitation Services Pager [phone] Office [phone]  Mason May, Riley Nearing 08/14/2018,3:49 PM

## 2018-08-14 NOTE — Progress Notes (Signed)
PROGRESS NOTE  Mason May  WUJ:[id number] DOB: [birth year] DOA: Aug 23, 2018 PCP: Lance Bosch, MD   Brief Narrative: Mason May is a 73 y.o.male,with past medical history of CVA, dementia, seizures, schizophrenia, interstitial lung disease on CellCept, COPD, baseline on 4 L nasal cannula, patient is a resident at [facility], [place], which has COVID-19 outbreak, was sent to [facility] emergency department because of concern of psychiatric illness, patient reports he is feeling going crazy, but he was noted to have fever 100.2, tachycardic, chest x-ray significant for multifocal pneumonia, his COVID-19 test came back positive, so patient transferred to [facility] for further care.  Assessment & Plan: Active Problems:   Seizure (HCC)   Schizo affective schizophrenia (HCC)   Chronic respiratory failure with hypoxia (HCC)   Pneumonia   Hypothyroidism   Interstitial lung disease (HCC)   COVID-19 virus infection   COVID-19   Pressure injury of skin  Acute on chronic hypoxic respiratory failure due to covid-19 superimposed on COPD, ILD:  - Continue supplemental oxygen as needed to maintain SpO2 >90% - Continue cellcept and bronchodilators.  - Maintain negative balance, repeat lasix and monitor I/O, weights.   Aspiration pneumonitis, pneumonia: - Suspected aspiration 5/19. Will continue SLP efforts, stable enough for reevaluation today, started unasyn. PCT not elevated, CXR improving, repeat CXR 5/23. - Continue IV medication substitutions as able to minimize aspiration risk.  Pneumonia due to Covid 19 infection: Patient was febrile, tachycardic upon presentation to ED, his COVID-19 facility, sister reports he has been having symptoms for 2 weeks. - Did receive Actemra Aug 23, 2018, and received steroids briefly. Inflammatory markers showing significant improvement, CRP normal. - With this patient being ~2 weeks since symptom onset and decreasing inflammatory markers, will not continue steroids  or repeat actemra.  Pleuritic chest pain:  - Tylenol, NSAIDs and tramadol are not options for this covid pt w/hx seizures.  Hypotension: Improving.  - DC midodrine which was started while hospitalized.   Acute metabolic encephalopathy: This most likely in the setting of infectious process, - Continue home psychotropic medications   Schizophrenia and schizoaffective disorder,/hallucination - Continue with invega, zyprexa, seroquel. Substitute xanax with ativan IV prn  History of seizures - Continue with depakote  Hypothyroidism: TSH wnl - Continue with home dose levothyroxine changed to IV, may change back to po if tolerating po reliably.  Hyperlipidemia - Continue with statin  BPH - Continue with flomax  Hypokalemia/hypomagnesemia - Repleted, recheck 5/23  Stage2 sacral pressure ulcer: POA - Offload as able.  DVT prophylaxis: Lovenox Code Status: DNR Family Communication: Sister by phone today. Disposition Plan: Uncertain, continues to have a guarded prognosis.   Consultants:   None  Procedures:   None  Antimicrobials:  Azithromycin   Subjective: He passed swallow study enough to get dysphagia diet, thickened liquids but did show silent aspiration. Feels a bit better today, slept a bit better, he's cold, still short of breath. Pain controlled.  Objective: Vitals:   08/14/18 0535 08/14/18 0558 08/14/18 0752 08/14/18 1148  BP:  116/75 120/90 122/87  Pulse:  (!) 109 (!) 107 98  Resp:  14    Temp: 99.2 F (37.3 C)  99.6 F (37.6 C) 98.5 F (36.9 C)  TempSrc:   Axillary Axillary  SpO2:  (!) 89% 90% 96%  Weight:      Height:        Intake/Output Summary (Last 24 hours) at 08/14/2018 1630 Last data filed at 08/14/2018 0606 Gross per 24 hour  Intake --  Output 500 ml  Net -500 ml   Filed Weights   06-08-2018 1900 08/07/18 0451 08/14/18 0500  Weight: 87.6 kg 87.4 kg 89.6 kg   Gen: Elderly male in no distress Pulm: Nonlabored on supplemental  oxygen. Clear. CV: Regular borderline tachycardia. No murmur, rub, or gallop. No JVD, 1+ edema. GI: Abdomen soft, non-tender, non-distended, with normoactive bowel sounds.  Ext: Warm, stable swan neck deformities in R > L hands, otherwise no deformities Skin: No rashes, lesions or ulcers on visualized skin. Neuro: Alert and oriented. No focal neurological deficits. Psych: Judgement and insight appear fair. Mood anxious & affect congruent. Behavior is appropriate.    Data Reviewed: I have personally reviewed following labs and imaging studies  CBC: Recent Labs  Lab 08/08/18 0126 08/09/18 0342 08/10/18 0305 08/12/18 0342  WBC 2.2* 4.4 3.1* 3.5*  NEUTROABS 1.4* 2.7 1.8 1.9  HGB 12.0* 12.0* 12.2* 12.3*  HCT 36.7* 38.4* 37.9* 39.7  MCV 91.8 92.8 94.0 95.4  PLT 225 258 236 176   Basic Metabolic Panel: Recent Labs  Lab 08/08/18 0126 08/09/18 0342 08/10/18 0305 08/10/18 1944 08/11/18 0547 08/12/18 0342  NA 142 142 142  --  140 142  K 4.1 3.2* 4.0  --  3.7 3.6  CL 104 100 105  --  102 102  CO2 30 32 29  --  30 31  GLUCOSE 169* 90 110*  --  101* 76  BUN 9 15 16   --  16 18  CREATININE 0.34* 0.40* 0.38*  --  0.41* 0.40*  CALCIUM 8.7* 8.6* 8.5*  --  8.7* 8.5*  MG 2.1 1.9 2.0 1.9 1.8  --    GFR: Estimated Creatinine Clearance: 94.3 mL/min (A) (by C-G formula based on SCr of 0.4 mg/dL (L)). Liver Function Tests: Recent Labs  Lab 08/08/18 0126 08/09/18 0342 08/10/18 0305 08/11/18 0547 08/12/18 0342  AST 27 27 28  49* 61*  ALT 17 17 17 27  32  ALKPHOS 45 45 48 46 44  BILITOT 0.4 0.6 0.4 0.4 0.4  PROT 5.9* 6.0* 5.9* 5.9* 5.4*  ALBUMIN 2.9* 3.2* 3.1* 3.3* 3.0*   CBG: Recent Labs  Lab 08/09/18 0809  GLUCAP 78   Lipid Profile: No results for input(s): CHOL, HDL, LDLCALC, TRIG, CHOLHDL, LDLDIRECT in the last 72 hours. Thyroid Function Tests: Recent Labs    08/12/18 0342  TSH 0.554   Anemia Panel: No results for input(s): VITAMINB12, FOLATE, FERRITIN, TIBC, IRON,  RETICCTPCT in the last 72 hours. Urine analysis:    Component Value Date/Time   COLORURINE YELLOW (A) 03/26/2016 1120   APPEARANCEUR CLEAR (A) 03/26/2016 1120   LABSPEC 1.024 03/26/2016 1120   PHURINE 5.0 03/26/2016 1120   GLUCOSEU NEGATIVE 03/26/2016 1120   HGBUR MODERATE (A) 03/26/2016 1120   BILIRUBINUR NEGATIVE 03/26/2016 1120   KETONESUR 80 (A) 03/26/2016 1120   PROTEINUR 100 (A) 03/26/2016 1120   NITRITE NEGATIVE 03/26/2016 1120   LEUKOCYTESUR NEGATIVE 03/26/2016 1120   Recent Results (from the past 240 hour(s))  Blood Culture (routine x 2)     Status: None   Collection Time: 06-08-2018 12:30 PM  Result Value Ref Range Status   Specimen Description BLOOD LEFT ARM  Final   Special Requests   Final    BOTTLES DRAWN AEROBIC AND ANAEROBIC Blood Culture adequate volume   Culture   Final    NO GROWTH 5 DAYS Performed at [facility], [address] [place], [place] [address]    Report Status 08/11/2018 FINAL  Final  SARS Coronavirus 2 [facility] order, Performed in [facility] hospital lab)     Status: Abnormal   Collection Time: 08-12-18 12:30 PM  Result Value Ref Range Status   SARS Coronavirus 2 POSITIVE (A) NEGATIVE Final    Comment: RESULT CALLED TO, READ BACK BY AND VERIFIED WITH: KAILEY WALKER Aug 12, 2018 @ 1419  MLK (NOTE) If result is NEGATIVE SARS-CoV-2 target nucleic acids are NOT DETECTED. The SARS-CoV-2 RNA is generally detectable in upper and lower  respiratory specimens during the acute phase of infection. The lowest  concentration of SARS-CoV-2 viral copies this assay can detect is 250  copies / mL. A negative result does not preclude SARS-CoV-2 infection  and should not be used as the sole basis for treatment or other  patient management decisions.  A negative result may occur with  improper specimen collection / handling, submission of specimen other  than nasopharyngeal swab, presence of viral mutation(s) within the  areas targeted by this assay,  and inadequate number of viral copies  (<250 copies / mL). A negative result must be combined with clinical  observations, patient history, and epidemiological information. If result is POSITIVE SARS-CoV-2 target nucleic acids are DETECTED. The  SARS-CoV-2 RNA is generally detectable in upper and lower  respiratory specimens during the acute phase of infection.  Positive  results are indicative of active infection with SARS-CoV-2.  Clinical  correlation with patient history and other diagnostic information is  necessary to determine patient infection status.  Positive results do  not rule out bacterial infection or co-infection with other viruses. If result is PRESUMPTIVE POSTIVE SARS-CoV-2 nucleic acids MAY BE PRESENT.   A presumptive positive result was obtained on the submitted specimen  and confirmed on repeat testing.  While 2019 novel coronavirus  (SARS-CoV-2) nucleic acids may be present in the submitted sample  additional confirmatory testing may be necessary for epidemiological  and / or clinical management purposes  to differentiate between  SARS-CoV-2 and other Sarbecovirus currently known to infect humans.  If clinically indicated additional testing with an alternate test  methodology [id number]) is a dvised. The SARS-CoV-2 RNA is generally  detectable in upper and lower respiratory specimens during the acute  phase of infection. The expected result is Negative. Fact Sheet for Patients:  BoilerBrush.com.cy Fact Sheet for Healthcare Providers: https://pope.com/ This test is not yet approved or cleared by the [place] FDA and has been authorized for detection and/or diagnosis of SARS-CoV-2 by FDA under an Emergency Use Authorization (EUA).  This EUA will remain in effect (meaning this test can be used) for the duration of the COVID-19 declaration under Section 564(b)(1) of the Act, 21 U.S.C. section 360bbb-3(b)(1), unless  the authorization is terminated or revoked sooner. Performed at [facility], [address], [place], [place] [address]       Radiology Studies: Dg Swallowing Func-speech Pathology  Result Date: 08/14/2018 Objective Swallowing Evaluation: Type of Study: MBS-Modified Barium Swallow Study  Patient Details Name: Mason May MRN: [id number] Date of Birth: December 27, 1945 Today's Date: 08/14/2018 Time: SLP Start Time (ACUTE ONLY): 1345 -SLP Stop Time (ACUTE ONLY): 1420 SLP Time Calculation (min) (ACUTE ONLY): 35 min Past Medical History: Past Medical History: Diagnosis Date  Cerebral vascular disease   Dementia (HCC)   Schizo affective schizophrenia (HCC)   Seizures (HCC)  Past Surgical History: No past surgical history on file. HPI: Pt is a 73 yo male admitted from [facility] due to concern for psychiatric illness (pt felt  like he was going crazy). He was noted to have a fever, tachycardia, and CXR concerning for PNA. COVID test was positive. PMH includes: CVA, dementia, seizures, schizophrenia, COPD. Pt was observed to cough with thin liquids on 5/18, therefore SLP swallow evaluation was ordered.  Subjective: pt provides conflicting information about PLOF but seems as though he eats very little that requires chewing Assessment / Plan / Recommendation CHL IP CLINICAL IMPRESSIONS 08/14/2018 Clinical Impression Pt presents with a moderate oral/oropharyngeal dysphagia with premature spillage of thin and nectar thick liquids over the base of tongue into the vestibule prior to an otherwise relatively timely and strong swallow initiation. Aspiration of thin liquids was sensed, but with nectar x2, silent with no response. Pt was able to achieve better bolus cohesion for transit with honey thick liquids. Cup sips were less coordinated as pt took several sips, pushing boluses to valleculae without initaiting a swallow. Recommend pt consume dys 2 solids (edentulous but able to masticate with gums) and  honey thick liquids with a slow rate controlled by supervising staff member. One sip at a time or spoon feeding as needed.  SLP Visit Diagnosis Dysphagia, unspecified (R13.10) Attention and concentration deficit following -- Frontal lobe and executive function deficit following -- Impact on safety and function --   CHL IP TREATMENT RECOMMENDATION 08/14/2018 Treatment Recommendations Therapy as outlined in treatment plan below   Prognosis 08/14/2018 Prognosis for Safe Diet Advancement Good Barriers to Reach Goals -- Barriers/Prognosis Comment -- CHL IP DIET RECOMMENDATION 08/14/2018 SLP Diet Recommendations Dysphagia 2 (Fine chop) solids;Honey thick liquids Liquid Administration via Spoon;Cup Medication Administration Crushed with puree Compensations Slow rate;Small sips/bites Postural Changes Remain semi-upright after after feeds/meals (Comment);Seated upright at 90 degrees   CHL IP OTHER RECOMMENDATIONS 08/14/2018 Recommended Consults -- Oral Care Recommendations Oral care BID Other Recommendations Order thickener from pharmacy   CHL IP FOLLOW UP RECOMMENDATIONS 08/14/2018 Follow up Recommendations Skilled Nursing facility   [facility] IP FREQUENCY AND DURATION 08/14/2018 Speech Therapy Frequency (ACUTE ONLY) min 2x/week Treatment Duration 2 weeks      No flowsheet data found. CHL IP PHARYNGEAL PHASE 08/14/2018 Pharyngeal Phase Impaired Pharyngeal- Pudding Teaspoon -- Pharyngeal -- Pharyngeal- Pudding Cup -- Pharyngeal -- Pharyngeal- Honey Teaspoon Pharyngeal residue - valleculae;Delayed swallow initiation-vallecula Pharyngeal -- Pharyngeal- Honey Cup Delayed swallow initiation-pyriform sinuses Pharyngeal -- Pharyngeal- Nectar Teaspoon Delayed swallow initiation-pyriform sinuses;Penetration/Aspiration before swallow;Trace aspiration Pharyngeal Material enters airway, passes BELOW cords without attempt by patient to eject out (silent aspiration) Pharyngeal- Nectar Cup -- Pharyngeal -- Pharyngeal- Nectar Straw -- Pharyngeal --  Pharyngeal- Thin Teaspoon -- Pharyngeal -- Pharyngeal- Thin Cup -- Pharyngeal -- Pharyngeal- Thin Straw Penetration/Aspiration before swallow;Moderate aspiration;Delayed swallow initiation-pyriform sinuses Pharyngeal Material enters airway, passes BELOW cords and not ejected out despite cough attempt by patient Pharyngeal- Puree Delayed swallow initiation-vallecula Pharyngeal -- Pharyngeal- Mechanical Soft Delayed swallow initiation-vallecula Pharyngeal -- Pharyngeal- Regular -- Pharyngeal -- Pharyngeal- Multi-consistency -- Pharyngeal -- Pharyngeal- Pill -- Pharyngeal -- Pharyngeal Comment --  CHL IP CERVICAL ESOPHAGEAL PHASE 08/14/2018 Cervical Esophageal Phase WFL Pudding Teaspoon -- Pudding Cup -- Honey Teaspoon -- Honey Cup -- Nectar Teaspoon -- Nectar Cup -- Nectar Straw -- Thin Teaspoon -- Thin Cup -- Thin Straw -- Puree -- Mechanical Soft -- Regular -- Multi-consistency -- Pill -- Cervical Esophageal Comment -- Harlon Ditty, MA CCC-SLP Acute Rehabilitation Services Pager [phone] Office [phone] Claudine Mouton 08/14/2018, 3:50 PM               Scheduled Meds:  divalproex  500 mg Oral QHS   enoxaparin (LOVENOX) injection  40 mg Subcutaneous Q24H   levothyroxine  25 mcg Intravenous Daily   mometasone-formoterol  2 puff Inhalation BID   mycophenolate  1,500 mg Oral BID   OLANZapine  5 mg Oral QHS   oxymetazoline  1 spray Each Nare BID   paliperidone  12 mg Oral q morning - 10a   pantoprazole  40 mg Oral Daily   QUEtiapine  50 mg Oral BID   tamsulosin  0.4 mg Oral QPC supper   traZODone  50 mg Oral QHS   Continuous Infusions:  ampicillin-sulbactam (UNASYN) IV 3 g (08/14/18 1523)     LOS: 8 days   Time spent: 25 minutes.  Tyrone Nine, MD [facility] [email or web address] Password [facility] 08/14/2018, 4:30 PM

## 2018-08-14 NOTE — Progress Notes (Signed)
  Speech Language Pathology Treatment: Dysphagia  Patient Details Name: Mason May MRN: [id number] DOB: 26-Apr-1945 Today's Date: 08/14/2018 Time: 0950-1005 SLP Time Calculation (min) (ACUTE ONLY): 15 min  Assessment / Plan / Recommendation Clinical Impression  Pt demonstrates improved tolerance of PO trials today; he is on 15 L [place], O2 sats at baseline at 90. During assessment, sats dropped to 86 at the lowest, but rebounded with rest. Initial ice chip resulted in immediate cough, but otherwise trials of puree and sips of water promising for improved tolerance. Will proceed with MBS for possible diet initiation, planned for around 1445. Pt agrees with plan.   HPI HPI: Pt is a 73 yo male admitted from [facility] SN[facility] due to concern for psychiatric illness (pt felt like he was going crazy). He was noted to have a fever, tachycardia, and CXR concerning for PNA. COVID test was positive. PMH includes: CVA, dementia, seizures, schizophrenia, COPD. Pt was observed to cough with thin liquids on 5/18, therefore SLP swallow evaluation was ordered.      SLP Plan  MBS       Recommendations  Diet recommendations: NPO Medication Administration: Crushed with puree                Oral Care Recommendations: Oral care QID Follow up Recommendations: Skilled Nursing facility SLP Visit Diagnosis: Dysphagia, unspecified (R13.10) Plan: MBS       GO               Harlon Ditty, MA CCC-SLP  Acute Rehabilitation Services Pager [phone] Office [phone]  Claudine Mouton 08/14/2018, 12:24 PM

## 2018-08-15 ENCOUNTER — Inpatient Hospital Stay (HOSPITAL_COMMUNITY): Payer: Medicare Other

## 2018-08-15 LAB — CBC WITH DIFFERENTIAL/PLATELET
Abs Immature Granulocytes: 0.11 K/uL — ABNORMAL HIGH (ref 0.00–0.07)
Basophils Absolute: 0 K/uL (ref 0.0–0.1)
Basophils Relative: 1 %
Eosinophils Absolute: 0.1 K/uL (ref 0.0–0.5)
Eosinophils Relative: 2 %
HCT: 42.6 % (ref 39.0–52.0)
Hemoglobin: 12.8 g/dL — ABNORMAL LOW (ref 13.0–17.0)
Immature Granulocytes: 3 %
Lymphocytes Relative: 28 %
Lymphs Abs: 1.1 K/uL (ref 0.7–4.0)
MCH: 28.8 pg (ref 26.0–34.0)
MCHC: 30 g/dL (ref 30.0–36.0)
MCV: 95.9 fL (ref 80.0–100.0)
Monocytes Absolute: 0.8 K/uL (ref 0.1–1.0)
Monocytes Relative: 19 %
Neutro Abs: 2 K/uL (ref 1.7–7.7)
Neutrophils Relative %: 47 %
Platelets: 180 K/uL (ref 150–400)
RBC: 4.44 MIL/uL (ref 4.22–5.81)
RDW: 14.7 % (ref 11.5–15.5)
WBC: 4.1 K/uL (ref 4.0–10.5)
nRBC: 0 % (ref 0.0–0.2)

## 2018-08-15 LAB — COMPREHENSIVE METABOLIC PANEL WITH GFR
ALT: 35 U/L (ref 0–44)
AST: 58 U/L — ABNORMAL HIGH (ref 15–41)
Albumin: 3.6 g/dL (ref 3.5–5.0)
Alkaline Phosphatase: 53 U/L (ref 38–126)
Anion gap: 11 (ref 5–15)
BUN: 9 mg/dL (ref 8–23)
CO2: 36 mmol/L — ABNORMAL HIGH (ref 22–32)
Calcium: 8.6 mg/dL — ABNORMAL LOW (ref 8.9–10.3)
Chloride: 103 mmol/L (ref 98–111)
Creatinine, Ser: 0.54 mg/dL — ABNORMAL LOW (ref 0.61–1.24)
GFR calc Af Amer: >60 mL/min
GFR calc non Af Amer: >60 mL/min
Glucose, Bld: 104 mg/dL — ABNORMAL HIGH (ref 70–99)
Potassium: 2.4 mmol/L — CL (ref 3.5–5.1)
Sodium: 150 mmol/L — ABNORMAL HIGH (ref 135–145)
Total Bilirubin: 1 mg/dL (ref 0.3–1.2)
Total Protein: 6.3 g/dL — ABNORMAL LOW (ref 6.5–8.1)

## 2018-08-15 LAB — GLUCOSE, CAPILLARY
Glucose-Capillary: 101 mg/dL — ABNORMAL HIGH (ref 70–99)
Glucose-Capillary: 136 mg/dL — ABNORMAL HIGH (ref 70–99)
Glucose-Capillary: 112 mg/dL — ABNORMAL HIGH (ref 70–99)
Glucose-Capillary: 127 mg/dL — ABNORMAL HIGH (ref 70–99)

## 2018-08-15 LAB — C-REACTIVE PROTEIN: CRP: <0.8 mg/dL (ref <1.0)

## 2018-08-15 LAB — MAGNESIUM: Magnesium: 2.1 mg/dL (ref 1.7–2.4)

## 2018-08-15 MED ORDER — POTASSIUM CHLORIDE 2 MEQ/ML IV SOLN
INTRAVENOUS | Status: DC
  Administered 2018-08-15 – 2018-08-16 (×3): via INTRAVENOUS
  Filled 2018-08-15 (×4): qty 1000

## 2018-08-15 NOTE — Plan of Care (Signed)
Dr. Text to inform of critical lab: Potassium 2.4

## 2018-08-15 NOTE — Plan of Care (Signed)
Patient continues to insist on having thin liquids to drink.  Discussed with Dr. and understands that he will probably aspirate per SLP  Patient verbalized understanding of effects to his body and possible death. Dr. Jovita Gamma verbal "ok" to give thin liquids and food as patient desires.  RN care order also placed.

## 2018-08-15 NOTE — Progress Notes (Signed)
PROGRESS NOTE  Mason May  ZOX:[id number] DOB: Oct 30, 1945 DOA: 04-26-18 PCP: Lance BoschWarshaw, Gregg A, MD   Brief Narrative: Mason May is a 73 y.o.male,with past medical history of CVA, dementia, seizures, schizophrenia, interstitial lung disease on CellCept, COPD, baseline on 4 L nasal cannula, [facility] [place], which has COVID-19 outbreak, was sent to [facility] emergency department because of concern of psychiatric illness, patient reports he is feeling going crazy, but he was noted to have fever 100.2, tachycardic, chest x-ray significant for multifocal pneumonia, his COVID-19 test came back positive, so patient transferred to [facility] for further care. Oxygen requirements have escalated due to aspiration despite unasyn and making NPO. Diet advanced to thickened liquids and mechanical soft though patient continues to have evident aspiration and requests transition to normal liquids for comfort despite the expectation that aspiration will lead to life-limiting illness.   Assessment & Plan: Active Problems:   Seizure (HCC)   Schizo affective schizophrenia (HCC)   Chronic respiratory failure with hypoxia (HCC)   Pneumonia   Hypothyroidism   Interstitial lung disease (HCC)   COVID-19 virus infection   COVID-19   Pressure injury of skin  Acute on chronic hypoxic respiratory failure due to covid-19 superimposed on COPD, ILD:  - Continue supplemental oxygen as needed to maintain SpO2 >90% - Continue cellcept and bronchodilators.  - Maintain negative balance, repeat lasix and monitor I/O, weights.   Aspiration pneumonitis, pneumonia: - Suspected aspiration 5/19 and made NPO, started unasyn, SLP evaluation 5/22 and started thickened liquids with continued coughing with po intake. Pt no longer willing to have liquids thickened nor to be NPO. He is not adherent to adjustments recommended including chin tuck per RN. Agrees to take risks that include death in order to have  comfort feeds. Discussed at length with his sister by phone today and confirmed with nurse and SLP, will allow regular diet. - Continue IV medication substitutions as able to minimize aspiration risk.  Pneumonia due to Covid 19 infection: Patient was febrile, tachycardic upon presentation to ED, his COVID-19 facility, sister reports he has been having symptoms for 2 weeks. - Did receive Actemra 04-26-18, and received steroids briefly. Inflammatory markers showing significant improvement, CRP normal. - With this patient being ~2 weeks since symptom onset and decreasing inflammatory markers, will not continue steroids or repeat actemra.  Pleuritic chest pain:  - Tylenol, NSAIDs and tramadol are not options for this covid pt w/hx seizures.  Hypotension: Resolved.  - DC'ed midodrine which was started while hospitalized.   Acute metabolic encephalopathy: This most likely in the setting of infectious process, appears to be back to baseline per sister. - Continue home psychotropic medications   Schizophrenia and schizoaffective disorder,/hallucination - Continue with invega, zyprexa, seroquel. Substitute xanax with ativan IV prn  History of seizures - Continue with depakote  Hypothyroidism: TSH wnl - Continue with home dose levothyroxine changed to IV   Hyperlipidemia - Continue with statin  BPH - Continue with flomax  Hypokalemia/hypomagnesemia - Repleted and worsening, will add to D5W.  Hypernatremia:  - Start D5W to replace free water deficit.   Stage2 sacral pressure ulcer: POA - Offload as able.  DVT prophylaxis: Lovenox Code Status: DNR Family Communication: Sister by phone today. Disposition Plan: Uncertain, continues to have a guarded prognosis.   Consultants:   None  Procedures:   None  Antimicrobials:  Azithromycin   Subjective: RN reports ongoing aspiration/coughing with po intake. He feels his breathing is about the  same, wants to take normal  po, thirsty. No uncontrolled pain, chest pain, fevers, sputum production.    Objective: Vitals:   08/14/18 1600 08/14/18 1947 08/15/18 0555 08/15/18 0824  BP: 124/76 127/77 112/66 107/60  Pulse: (!) 103   (!) 107  Resp:      Temp: 98.8 F (37.1 C) 98.8 F (37.1 C) 97.8 F (36.6 C) (!) 97.4 F (36.3 C)  TempSrc: Axillary Oral Oral Oral  SpO2: 90%   93%  Weight:      Height:        Intake/Output Summary (Last 24 hours) at 08/15/2018 0911 Last data filed at 08/15/2018 0200 Gross per 24 hour  Intake 960 ml  Output 1650 ml  Net -690 ml   Filed Weights   09-05-18 1900 08/07/18 0451 08/14/18 0500  Weight: 87.6 kg 87.4 kg 89.6 kg   Gen: 73 y.o. male in no distress Pulm: Tachypneic on supplemental oxygen, HFNC. CV: Regular tachycardia. No murmur, rub, or gallop. No JVD, trace dependent edema. GI: Abdomen soft, non-tender, non-distended, with normoactive bowel sounds.  Ext: Warm, no deformities Skin: No rashes, lesions or ulcers on visualized skin. Neuro: Alert and oriented. No focal neurological deficits. Psych: Judgement and insight appear fair. Mood anxious & affect congruent. Behavior is appropriate.    Data Reviewed: I have personally reviewed following labs and imaging studies  CBC: Recent Labs  Lab 08/09/18 0342 08/10/18 0305 08/12/18 0342 08/15/18 0630  WBC 4.4 3.1* 3.5* 4.1  NEUTROABS 2.7 1.8 1.9 2.0  HGB 12.0* 12.2* 12.3* 12.8*  HCT 38.4* 37.9* 39.7 42.6  MCV 92.8 94.0 95.4 95.9  PLT 258 236 176 180   Basic Metabolic Panel: Recent Labs  Lab 08/09/18 0342 08/10/18 0305 08/10/18 1944 08/11/18 0547 08/12/18 0342 08/15/18 0630  NA 142 142  --  140 142 150*  K 3.2* 4.0  --  3.7 3.6 2.4*  CL 100 105  --  102 102 103  CO2 32 29  --  30 31 36*  GLUCOSE 90 110*  --  101* 76 104*  BUN 15 16  --  CREATININE 0.40* 0.38*  --  0.41* 0.40* 0.54*  CALCIUM 8.6* 8.5*  --  8.7* 8.5* 8.6*  MG 1.9 2.0 1.9 1.8  --  2.1   GFR: Estimated Creatinine Clearance:  94.3 mL/min (A) (by C-G formula based on SCr of 0.54 mg/dL (L)). Liver Function Tests: Recent Labs  Lab 08/09/18 0342 08/10/18 0305 08/11/18 0547 08/12/18 0342 08/15/18 0630  AST 27 28 49* 61* 58*  ALT 32 35  ALKPHOS 45 48 46 44 53  BILITOT 0.6 0.4 0.4 0.4 1.0  PROT 6.0* 5.9* 5.9* 5.4* 6.3*  ALBUMIN 3.2* 3.1* 3.3* 3.0* 3.6   CBG: Recent Labs  Lab 08/09/18 0809 08/15/18 0825  GLUCAP 78 101*   Lipid Profile: No results for input(s): CHOL, HDL, LDLCALC, TRIG, CHOLHDL, LDLDIRECT in the last 72 hours. Thyroid Function Tests: No results for input(s): TSH, T4TOTAL, FREET4, T3FREE, THYROIDAB in the last 72 hours. Anemia Panel: No results for input(s): VITAMINB12, FOLATE, FERRITIN, TIBC, IRON, RETICCTPCT in the last 72 hours. Urine analysis:    Component Value Date/Time   COLORURINE YELLOW (A) 03/26/2016 1120   APPEARANCEUR CLEAR (A) 03/26/2016 1120   LABSPEC 1.024 03/26/2016 1120   PHURINE 5.0 03/26/2016 1120   GLUCOSEU NEGATIVE 03/26/2016 1120   HGBUR MODERATE (A) 03/26/2016 1120   BILIRUBINUR NEGATIVE 03/26/2016 1120   KETONESUR 80 (A)  03/26/2016 1120   PROTEINUR 100 (A) 03/26/2016 1120   NITRITE NEGATIVE 03/26/2016 1120   LEUKOCYTESUR NEGATIVE 03/26/2016 1120   Recent Results (from the past 240 hour(s))  Blood Culture (routine x 2)     Status: None   Collection Time: Aug 19, 2018 12:30 PM  Result Value Ref Range Status   Specimen Description BLOOD LEFT ARM  Final   Special Requests   Final    BOTTLES DRAWN AEROBIC AND ANAEROBIC Blood Culture adequate volume   Culture   Final    NO GROWTH 5 DAYS Performed at [facility], [address] [place], [place] [address]    Report Status 08/11/2018 FINAL  Final  SARS Coronavirus 2 [facility] order, Performed in [facility] hospital lab)     Status: Abnormal   Collection Time: Aug 19, 2018 12:30 PM  Result Value Ref Range Status   SARS Coronavirus 2 POSITIVE (A) NEGATIVE Final    Comment: RESULT CALLED TO, READ  BACK BY AND VERIFIED WITH: KAILEY WALKER 19-Aug-2018 @ 1419  MLK (NOTE) If result is NEGATIVE SARS-CoV-2 target nucleic acids are NOT DETECTED. The SARS-CoV-2 RNA is generally detectable in upper and lower  respiratory specimens during the acute phase of infection. The lowest  concentration of SARS-CoV-2 viral copies this assay can detect is 250  copies / mL. A negative result does not preclude SARS-CoV-2 infection  and should not be used as the sole basis for treatment or other  patient management decisions.  A negative result May occur with  improper specimen collection / handling, submission of specimen other  than nasopharyngeal swab, presence of viral mutation(s) within the  areas targeted by this assay, and inadequate number of viral copies  (<250 copies / mL). A negative result must be combined with clinical  observations, patient history, and epidemiological information. If result is POSITIVE SARS-CoV-2 target nucleic acids are DETECTED. The  SARS-CoV-2 RNA is generally detectable in upper and lower  respiratory specimens during the acute phase of infection.  Positive  results are indicative of active infection with SARS-CoV-2.  Clinical  correlation with patient history and other diagnostic information is  necessary to determine patient infection status.  Positive results do  not rule out bacterial infection or co-infection with other viruses. If result is PRESUMPTIVE POSTIVE SARS-CoV-2 nucleic acids May BE PRESENT.   A presumptive positive result was obtained on the submitted specimen  and confirmed on repeat testing.  While 2019 novel coronavirus  (SARS-CoV-2) nucleic acids May be present in the submitted sample  additional confirmatory testing May be necessary for epidemiological  and / or clinical management purposes  to differentiate between  SARS-CoV-2 and other Sarbecovirus currently known to infect humans.  If clinically indicated additional testing with an alternate  test  methodology [phone]) is a dvised. The SARS-CoV-2 RNA is generally  detectable in upper and lower respiratory specimens during the acute  phase of infection. The expected result is Negative. Fact Sheet for Patients:  BoilerBrush.com.cy Fact Sheet for Healthcare Providers: https://pope.com/ This test is not yet approved or cleared by the [place] FDA and has been authorized for detection and/or diagnosis of SARS-CoV-2 by FDA under an Emergency Use Authorization (EUA).  This EUA will remain in effect (meaning this test can be used) for the duration of the COVID-19 declaration under Section 564(b)(1) of the Act, 21 U.S.C. section 360bbb-3(b)(1), unless the authorization is terminated or revoked sooner. Performed at [facility], [address], [place], [place] [address]  Radiology Studies: Dg Chest Port 1 View  Result Date: 08/15/2018 CLINICAL DATA:  73 year old male with history of acute respiratory disease secondary to COVID-19 infection. Aspiration pneumonia. EXAM: PORTABLE CHEST 1 VIEW COMPARISON:  Chest x-ray 08/12/2018. FINDINGS: Low lung volumes. Extensive airspace consolidation with air bronchograms and evidence of bronchiectasis in the left lower lobe. Other patchy areas of interstitial prominence and ill-defined airspace disease also noted in the right upper lobe and medial aspect of the right lung base. Small left pleural effusion. No evidence of pulmonary edema. Heart size is normal. Upper mediastinal contours are within normal limits. Aortic atherosclerosis. Postoperative changes of right upper extremity ORIF incompletely imaged. IMPRESSION: 1. Radiographic appearance the chest is essentially unchanged, as above, compatible with multilobar pneumonia. 2. Aortic atherosclerosis. Electronically Signed   By: Trudie Reed M.D.   On: 08/15/2018 08:21   Dg Swallowing Func-speech Pathology  Result Date:  08/14/2018 Objective Swallowing Evaluation: Type of Study: MBS-Modified Barium Swallow Study  Patient Details Name: Mason May MRN: [id number] Date of Birth: 1946/02/27 Today's Date: 08/14/2018 Time: SLP Start Time (ACUTE ONLY): 1345 -SLP Stop Time (ACUTE ONLY): 1420 SLP Time Calculation (min) (ACUTE ONLY): 35 min Past Medical History: Past Medical History: Diagnosis Date  Cerebral vascular disease   Dementia (HCC)   Schizo affective schizophrenia (HCC)   Seizures (HCC)  Past Surgical History: No past surgical history on file. HPI: Pt is a 73 yo male admitted from [facility] due to concern for psychiatric illness (pt felt like he was going crazy). He was noted to have a fever, tachycardia, and CXR concerning for PNA. COVID test was positive. PMH includes: CVA, dementia, seizures, schizophrenia, COPD. Pt was observed to cough with thin liquids on 5/18, therefore SLP swallow evaluation was ordered.  Subjective: pt provides conflicting information about PLOF but seems as though he eats very little that requires chewing Assessment / Plan / Recommendation CHL IP CLINICAL IMPRESSIONS 08/14/2018 Clinical Impression Pt presents with a moderate oral/oropharyngeal dysphagia with premature spillage of thin and nectar thick liquids over the base of tongue into the vestibule prior to an otherwise relatively timely and strong swallow initiation. Aspiration of thin liquids was sensed, but with nectar x2, silent with no response. Pt was able to achieve better bolus cohesion for transit with honey thick liquids. Cup sips were less coordinated as pt took several sips, pushing boluses to valleculae without initaiting a swallow. Recommend pt consume dys 2 solids (edentulous but able to masticate with gums) and honey thick liquids with a slow rate controlled by supervising staff member. One sip at a time or spoon feeding as needed.  SLP Visit Diagnosis Dysphagia, unspecified (R13.10) Attention and concentration deficit  following -- Frontal lobe and executive function deficit following -- Impact on safety and function --   CHL IP TREATMENT RECOMMENDATION 08/14/2018 Treatment Recommendations Therapy as outlined in treatment plan below   Prognosis 08/14/2018 Prognosis for Safe Diet Advancement Good Barriers to Reach Goals -- Barriers/Prognosis Comment -- CHL IP DIET RECOMMENDATION 08/14/2018 SLP Diet Recommendations Dysphagia 2 (Fine chop) solids;Honey thick liquids Liquid Administration via Spoon;Cup Medication Administration Crushed with puree Compensations Slow rate;Small sips/bites Postural Changes Remain semi-upright after after feeds/meals (Comment);Seated upright at 90 degrees   CHL IP OTHER RECOMMENDATIONS 08/14/2018 Recommended Consults -- Oral Care Recommendations Oral care BID Other Recommendations Order thickener from pharmacy   CHL IP FOLLOW UP RECOMMENDATIONS 08/14/2018 Follow up Recommendations Skilled Nursing facility   [facility] IP FREQUENCY AND DURATION 08/14/2018 Speech Therapy Frequency (  ACUTE ONLY) min 2x/week Treatment Duration 2 weeks      No flowsheet data found. CHL IP PHARYNGEAL PHASE 08/14/2018 Pharyngeal Phase Impaired Pharyngeal- Pudding Teaspoon -- Pharyngeal -- Pharyngeal- Pudding Cup -- Pharyngeal -- Pharyngeal- Honey Teaspoon Pharyngeal residue - valleculae;Delayed swallow initiation-vallecula Pharyngeal -- Pharyngeal- Honey Cup Delayed swallow initiation-pyriform sinuses Pharyngeal -- Pharyngeal- Nectar Teaspoon Delayed swallow initiation-pyriform sinuses;Penetration/Aspiration before swallow;Trace aspiration Pharyngeal Material enters airway, passes BELOW cords without attempt by patient to eject out (silent aspiration) Pharyngeal- Nectar Cup -- Pharyngeal -- Pharyngeal- Nectar Straw -- Pharyngeal -- Pharyngeal- Thin Teaspoon -- Pharyngeal -- Pharyngeal- Thin Cup -- Pharyngeal -- Pharyngeal- Thin Straw Penetration/Aspiration before swallow;Moderate aspiration;Delayed swallow initiation-pyriform sinuses  Pharyngeal Material enters airway, passes BELOW cords and not ejected out despite cough attempt by patient Pharyngeal- Puree Delayed swallow initiation-vallecula Pharyngeal -- Pharyngeal- Mechanical Soft Delayed swallow initiation-vallecula Pharyngeal -- Pharyngeal- Regular -- Pharyngeal -- Pharyngeal- Multi-consistency -- Pharyngeal -- Pharyngeal- Pill -- Pharyngeal -- Pharyngeal Comment --  CHL IP CERVICAL ESOPHAGEAL PHASE 08/14/2018 Cervical Esophageal Phase WFL Pudding Teaspoon -- Pudding Cup -- Honey Teaspoon -- Honey Cup -- Nectar Teaspoon -- Nectar Cup -- Nectar Straw -- Thin Teaspoon -- Thin Cup -- Thin Straw -- Puree -- Mechanical Soft -- Regular -- Multi-consistency -- Pill -- Cervical Esophageal Comment -- Mason Ditty, MA CCC-SLP Acute Rehabilitation Services Pager [phone] Office [phone] [place], Mason May 08/14/2018, 3:50 PM               Scheduled Meds:  divalproex  500 mg Oral QHS   enoxaparin (LOVENOX) injection  40 mg Subcutaneous Q24H   levothyroxine  25 mcg Intravenous Daily   mometasone-formoterol  2 puff Inhalation BID   mycophenolate  1,500 mg Oral BID   OLANZapine  5 mg Oral QHS   oxymetazoline  1 spray Each Nare BID   paliperidone  12 mg Oral q morning - 10a   pantoprazole  40 mg Oral Daily   QUEtiapine  50 mg Oral BID   tamsulosin  0.4 mg Oral QPC supper   traZODone  50 mg Oral QHS   Continuous Infusions:  ampicillin-sulbactam (UNASYN) IV 3 g (08/15/18 0129)   dextrose 5 % with kcl       LOS: 9 days   Time spent: 25 minutes.  Tyrone Nine, MD [facility] [email or web address] Password [email or web address] 08/15/2018, 9:11 AM

## 2018-08-15 NOTE — Plan of Care (Signed)
Coughs after drinking

## 2018-08-15 NOTE — Progress Notes (Signed)
SLP Cancellation Note  Patient Details Name: Mason May MRN: [id number] DOB: 05-31-1945   Cancelled treatment:       Reason Eval/Treat Not Completed: Other (comment) Pt has decided to accept risk of aspiration and consume foods and liquids of choice for comfort. MD coordinated plan with pt and family. Will sign off   Brighten Buzzelli, Riley Nearing 08/15/2018, 2:24 PM

## 2018-08-16 DIAGNOSIS — J9601 Acute respiratory failure with hypoxia: Secondary | ICD-10-CM

## 2018-08-16 DIAGNOSIS — U071 COVID-19: Principal | ICD-10-CM

## 2018-08-16 DIAGNOSIS — E039 Hypothyroidism, unspecified: Secondary | ICD-10-CM

## 2018-08-16 DIAGNOSIS — J690 Pneumonitis due to inhalation of food and vomit: Secondary | ICD-10-CM

## 2018-08-16 LAB — CBC
HCT: 37.8 % — ABNORMAL LOW (ref 39.0–52.0)
Hemoglobin: 11.6 g/dL — ABNORMAL LOW (ref 13.0–17.0)
MCH: 29.1 pg (ref 26.0–34.0)
MCHC: 30.7 g/dL (ref 30.0–36.0)
MCV: 94.7 fL (ref 80.0–100.0)
Platelets: 146 10*3/uL — ABNORMAL LOW (ref 150–400)
RBC: 3.99 MIL/uL — ABNORMAL LOW (ref 4.22–5.81)
RDW: 14.7 % (ref 11.5–15.5)
WBC: 4.8 10*3/uL (ref 4.0–10.5)
nRBC: 0 % (ref 0.0–0.2)

## 2018-08-16 LAB — GLUCOSE, CAPILLARY
Glucose-Capillary: 132 mg/dL — ABNORMAL HIGH (ref 70–99)
Glucose-Capillary: 121 mg/dL — ABNORMAL HIGH (ref 70–99)

## 2018-08-16 LAB — BASIC METABOLIC PANEL
Anion gap: 11 (ref 5–15)
BUN: 6 mg/dL — ABNORMAL LOW (ref 8–23)
CO2: 34 mmol/L — ABNORMAL HIGH (ref 22–32)
Calcium: 7.8 mg/dL — ABNORMAL LOW (ref 8.9–10.3)
Chloride: 97 mmol/L — ABNORMAL LOW (ref 98–111)
Creatinine, Ser: 0.44 mg/dL — ABNORMAL LOW (ref 0.61–1.24)
GFR calc Af Amer: >60 mL/min (ref >60)
GFR calc non Af Amer: >60 mL/min (ref >60)
Glucose, Bld: 104 mg/dL — ABNORMAL HIGH (ref 70–99)
Potassium: 2.5 mmol/L — CL (ref 3.5–5.1)
Sodium: 142 mmol/L (ref 135–145)

## 2018-08-16 LAB — MAGNESIUM: Magnesium: 1.6 mg/dL — ABNORMAL LOW (ref 1.7–2.4)

## 2018-08-16 LAB — PROCALCITONIN: Procalcitonin: <0.1 ng/mL

## 2018-08-16 LAB — C-REACTIVE PROTEIN: CRP: <0.8 mg/dL (ref <1.0)

## 2018-08-16 MED ORDER — POTASSIUM CHLORIDE 10 MEQ/100ML IV SOLN
10.0000 meq | INTRAVENOUS | Status: AC
  Administered 2018-08-16 (×4): 10 meq via INTRAVENOUS
  Filled 2018-08-16 (×4): qty 100

## 2018-08-16 MED ORDER — SODIUM CHLORIDE 0.9 % IV SOLN
INTRAVENOUS | Status: DC | PRN
  Administered 2018-08-16 – 2018-08-17 (×2): 250 mL via INTRAVENOUS

## 2018-08-16 MED ORDER — MAGNESIUM SULFATE 4 GM/100ML IV SOLN
4.0000 g | Freq: Once | INTRAVENOUS | Status: AC
  Administered 2018-08-16: 12:00:00 4 g via INTRAVENOUS
  Filled 2018-08-16: qty 100

## 2018-08-16 NOTE — Progress Notes (Signed)
If patient should expire his sister states to contact [facility]. Telephone # [phone].

## 2018-08-16 NOTE — Progress Notes (Signed)
CRITICAL VALUE ALERT  Critical Value:  Potassium 2.5   Date & Time Notied: 08/16/2018 @ 1104   Provider Notified: Dr Reece Agar  Orders Received/Actions taken: pending

## 2018-08-16 NOTE — Progress Notes (Addendum)
PROGRESS NOTE                                                                                                                                                                                                             Patient Demographics:    Mason May, is a 73 y.o. male, DOB - 10/25/1945, VPX:[id number]  Outpatient Primary MD for the patient is Lance Bosch, MD    LOS - 10  No chief complaint on file.      Brief Narrative: Patient is a 73 y.o. male with PMHx of CVA, dementia, seizures, schizophrenia, interstitial lung disease on CellCept, COPD, chronic hypoxemic respiratory failure on 4 L of oxygen at home-who presented to the emergency room with fever, multifocal pneumonia on x-ray-found to have COVID 19 pneumonia and transferred to the hospitalist service at [facility].  Unfortunately, hospital course complicated by worsening hypoxia-in the setting of ongoing COVID-19 pneumonia and aspiration pneumonitis   Subjective:    Mason May today remains very frail and weak-he is still requiring significant oxygen to maintain O2 saturations.  He continues to exhibit overt signs of aspiration-but continues to want to eat-with known aspiration risk.   Assessment  & Plan :   Acute hypoxemic respiratory failure secondary to COVID 19 viral pneumonia and aspiration pneumonia: Appears essentially unchanged-still requiring 12-15 L of HFNC to maintain O2 saturations.  Inflammatory markers for COVID-19 are not significantly elevated, remains on IV Unasyn for empiric treatment of aspiration pneumonia.  We will continue other supportive care-and if possible attempt to slowly titrate down FiO2.  However I am worried that this patient will continue to deteriorate-as he is noncompliant with SLP recommendations for diet-and has elected to consume regular food accepting all aspiration risks.  I reached out to the patient's sister over the phone-she  is aware of tenuous situation and the potential need to transition to comfort care if patient deteriorates any further.   COVID-19 Labs:  Recent Labs    08/15/18 0630  CRP <0.8    Lab Results  Component Value Date   SARSCOV2NAA POSITIVE (A) 08/08/18     COVID-19 Medications: 5/14>>Actemra 5/14>>5/16 Solumedrol  Hypokalemia: Replete and recheck  Hypernatremia: Resolved with D5W.  Hypotension: Resolved.   Acute metabolic encephalopathy: Secondary to pneumonia and hypoxia, improved with supportive care-appears back to baseline.    Schizophrenia  and schizoaffective disorder,/hallucination: Appears stable-seems to be awake and alert-able to converse-asking appropriate questions about diet and pneumonia.  Continue  invega, zyprexa, seroquel.  Depakote substitute xanax with ativan IV prn  History of seizures: Continue Depakote  Hypothyroidism: TSH wnl- Continue with home dose levothyroxine changed to IV   Hyperlipidemia: Continue with statin  BPH: Continue with flomax  Stage2 sacral pressure ulcer: POA. Offload as able.  Generalized weakness/deconditioning/dysphagia/failure to thrive syndrome: Severely deconditioned and debility due to acute illness-has worsening dysphagia.  DNR in place-prognosis is very poor-with high likelihood of continued clinical decline leading to death during this hospital stay.  Patient elects to continue to eat regular food for comfort.  Long discussion with patient's sister over the phone-she is aware of tenuous clinical situation and overall poor prognosis and potential for the patient to die over the next few days.  She is aware of DNR status and patient's decision for comfort feeding (agrees with both of these issues)    ABG:    Component Value Date/Time   PHART 7.40 03/28/2016 1605   PCO2ART 52 (H) 03/28/2016 1605   PO2ART 81 (L) 03/28/2016 1605   HCO3 32.2 (H) 03/28/2016 1605   O2SAT 95.9 03/28/2016 1605    Condition -  Prognosis is poor  Family Communication  :  Sister updated over the phone  Code Status :  DNR  Diet : comfort feedings  Disposition Plan  :  Remain inpatient  Consults  :  None  Procedures  :  None  DVT Prophylaxis  :  Lovenox   Lab Results  Component Value Date   PLT 146 (L) 08/16/2018    Inpatient Medications  Scheduled Meds:  divalproex  500 mg Oral QHS   enoxaparin (LOVENOX) injection  40 mg Subcutaneous Q24H   levothyroxine  25 mcg Intravenous Daily   mometasone-formoterol  2 puff Inhalation BID   mycophenolate  1,500 mg Oral BID   OLANZapine  5 mg Oral QHS   oxymetazoline  1 spray Each Nare BID   paliperidone  12 mg Oral q morning - 10a   pantoprazole  40 mg Oral Daily   QUEtiapine  50 mg Oral BID   tamsulosin  0.4 mg Oral QPC supper   traZODone  50 mg Oral QHS   Continuous Infusions:  ampicillin-sulbactam (UNASYN) IV 3 g (08/16/18 0917)   dextrose 5 % with kcl 75 mL/hr at 08/16/18 0139   PRN Meds:.acetaminophen, ALPRAZolam, guaiFENesin, Ipratropium-Albuterol, metoprolol tartrate, ondansetron, oxyCODONE, Resource ThickenUp Clear  Antibiotics  :    Anti-infectives (From admission, onward)   Start     Dose/Rate Route Frequency Ordered Stop   08/11/18 2000  Ampicillin-Sulbactam (UNASYN) 3 g in sodium chloride 0.9 % 100 mL IVPB     3 g 200 mL/hr over 30 Minutes Intravenous Every 6 hours 08/11/18 1952     08/08/18 2000  azithromycin (ZITHROMAX) tablet 500 mg  Status:  Discontinued     500 mg Oral Every 24 hours 08/08/18 1309 08/09/18 1055   2018-08-19 1930  azithromycin (ZITHROMAX) 500 mg in sodium chloride 0.9 % 250 mL IVPB  Status:  Discontinued     500 mg 250 mL/hr over 60 Minutes Intravenous Every 24 hours 08/19/18 1928 08/08/18 1309       Time Spent in minutes 35    Jeoffrey Massed M.D on 08/16/2018 at 10:57 AM  To page go to [email or web address] - use universal password  [facility] -  Office  [phone]   See  all Orders from  today for further details  Admit date - August 24, 2018    10    Objective:   Vitals:   08/16/18 0338 08/16/18 0400 08/16/18 0500 08/16/18 0939  BP: 102/70 100/87  (!) 98/47  Pulse: (!) 110  98   Resp:      Temp: 97.6 F (36.4 C) 98.3 F (36.8 C)  98 F (36.7 C)  TempSrc: Oral   Oral  SpO2: (!) 88%  96%   Weight: 87.4 kg     Height:        Wt Readings from Last 3 Encounters:  08/16/18 87.4 kg  08-24-18 84.4 kg  01/25/17 92.5 kg     Intake/Output Summary (Last 24 hours) at 08/16/2018 1057 Last data filed at 08/16/2018 0949 Gross per 24 hour  Intake 1427.79 ml  Output 600 ml  Net 827.79 ml     Physical Exam Gen Exam:Alert awake-not in any distress.  Very frail and chronically sick appearing HEENT:atraumatic, normocephalic Chest: B/L clear to auscultation anteriorly CVS:S1S2 regular Abdomen:soft non tender, non distended Extremities:++ edema Neurology: Appears to have generalized weakness-barely able to move bilateral lower extremities off the bed. Skin: no rash   Data Review:    CBC Recent Labs  Lab 08/10/18 0305 08/12/18 0342 08/15/18 0630 08/16/18 0755  WBC 3.1* 3.5* 4.1 4.8  HGB 12.2* 12.3* 12.8* 11.6*  HCT 37.9* 39.7 42.6 37.8*  PLT 236 176 180 146*  MCV 94.0 95.4 95.9 94.7  MCH 30.3 29.6 28.8 29.1  MCHC 32.2 31.0 30.0 30.7  RDW 13.9 14.2 14.7 14.7  LYMPHSABS 0.6* 1.0 1.1  --   MONOABS 0.5 0.3 0.8  --   EOSABS 0.0 0.0 0.1  --   BASOSABS 0.0 0.0 0.0  --     Chemistries  Recent Labs  Lab 08/10/18 0305 08/10/18 1944 08/11/18 0547 08/12/18 0342 08/15/18 0630 08/16/18 0755  NA 142  --  140 142 150* 142  K 4.0  --  3.7 3.6 2.4* 2.5*  CL 105  --  102 102 103 97*  CO2 29  --  30 31 36* 34*  GLUCOSE 110*  --  101* 76 104* 104*  BUN 16  --  6*  CREATININE 0.38*  --  0.41* 0.40* 0.54* 0.44*  CALCIUM 8.5*  --  8.7* 8.5* 8.6* 7.8*  MG 2.0 1.9 1.8  --  2.1 1.6*  AST 28  --  49* 61* 58*  --   ALT 17  --  27 32 35  --   ALKPHOS 48  --  46  44 53  --   BILITOT 0.4  --  0.4 0.4 1.0  --    ------------------------------------------------------------------------------------------------------------------ No results for input(s): CHOL, HDL, LDLCALC, TRIG, CHOLHDL, LDLDIRECT in the last 72 hours.  No results found for: HGBA1C ------------------------------------------------------------------------------------------------------------------ No results for input(s): TSH, T4TOTAL, T3FREE, THYROIDAB in the last 72 hours.  Invalid input(s): FREET3 ------------------------------------------------------------------------------------------------------------------ No results for input(s): VITAMINB12, FOLATE, FERRITIN, TIBC, IRON, RETICCTPCT in the last 72 hours.  Coagulation profile No results for input(s): INR, PROTIME in the last 168 hours.  No results for input(s): DDIMER in the last 72 hours.  Cardiac Enzymes No results for input(s): CKMB, TROPONINI, MYOGLOBIN in the last 168 hours.  Invalid input(s): CK ------------------------------------------------------------------------------------------------------------------    Component Value Date/Time   BNP 11.0 01/25/2017 1221    Micro Results Recent Results (from the past 240 hour(s))  Blood Culture (routine x 2)  Status: None   Collection Time: 07/27/2018 12:30 PM  Result Value Ref Range Status   Specimen Description BLOOD LEFT ARM  Final   Special Requests   Final    BOTTLES DRAWN AEROBIC AND ANAEROBIC Blood Culture adequate volume   Culture   Final    NO GROWTH 5 DAYS Performed at [facility], [address] [place], [place] [address]    Report Status 08/11/2018 FINAL  Final  SARS Coronavirus 2 [facility] order, Performed in [facility] hospital lab)     Status: Abnormal   Collection Time: 07/26/2018 12:30 PM  Result Value Ref Range Status   SARS Coronavirus 2 POSITIVE (A) NEGATIVE Final    Comment: RESULT CALLED TO, READ BACK BY AND VERIFIED WITH: KAILEY  WALKER 07/27/2018 @ 1419  MLK (NOTE) If result is NEGATIVE SARS-CoV-2 target nucleic acids are NOT DETECTED. The SARS-CoV-2 RNA is generally detectable in upper and lower  respiratory specimens during the acute phase of infection. The lowest  concentration of SARS-CoV-2 viral copies this assay can detect is 250  copies / mL. A negative result does not preclude SARS-CoV-2 infection  and should not be used as the sole basis for treatment or other  patient management decisions.  A negative result may occur with  improper specimen collection / handling, submission of specimen other  than nasopharyngeal swab, presence of viral mutation(s) within the  areas targeted by this assay, and inadequate number of viral copies  (<250 copies / mL). A negative result must be combined with clinical  observations, patient history, and epidemiological information. If result is POSITIVE SARS-CoV-2 target nucleic acids are DETECTED. The  SARS-CoV-2 RNA is generally detectable in upper and lower  respiratory specimens during the acute phase of infection.  Positive  results are indicative of active infection with SARS-CoV-2.  Clinical  correlation with patient history and other diagnostic information is  necessary to determine patient infection status.  Positive results do  not rule out bacterial infection or co-infection with other viruses. If result is PRESUMPTIVE POSTIVE SARS-CoV-2 nucleic acids MAY BE PRESENT.   A presumptive positive result was obtained on the submitted specimen  and confirmed on repeat testing.  While 2019 novel coronavirus  (SARS-CoV-2) nucleic acids may be present in the submitted sample  additional confirmatory testing may be necessary for epidemiological  and / or clinical management purposes  to differentiate between  SARS-CoV-2 and other Sarbecovirus currently known to infect humans.  If clinically indicated additional testing with an alternate test  methodology [id number]) is  a dvised. The SARS-CoV-2 RNA is generally  detectable in upper and lower respiratory specimens during the acute  phase of infection. The expected result is Negative. Fact Sheet for Patients:  BoilerBrush.com.cy Fact Sheet for Healthcare Providers: https://pope.com/ This test is not yet approved or cleared by the [place] FDA and has been authorized for detection and/or diagnosis of SARS-CoV-2 by FDA under an Emergency Use Authorization (EUA).  This EUA will remain in effect (meaning this test can be used) for the duration of the COVID-19 declaration under Section 564(b)(1) of the Act, 21 U.S.C. section 360bbb-3(b)(1), unless the authorization is terminated or revoked sooner. Performed at [facility], [address] [place], [place] [address]     Radiology Reports Dg Chest [place] 1 View  Result Date: 08/15/2018 CLINICAL DATA:  73 year old male with history of acute respiratory disease secondary to COVID-19 infection. Aspiration pneumonia. EXAM: PORTABLE CHEST 1 VIEW COMPARISON:  Chest x-ray 08/12/2018. FINDINGS:  Low lung volumes. Extensive airspace consolidation with air bronchograms and evidence of bronchiectasis in the left lower lobe. Other patchy areas of interstitial prominence and ill-defined airspace disease also noted in the right upper lobe and medial aspect of the right lung base. Small left pleural effusion. No evidence of pulmonary edema. Heart size is normal. Upper mediastinal contours are within normal limits. Aortic atherosclerosis. Postoperative changes of right upper extremity ORIF incompletely imaged. IMPRESSION: 1. Radiographic appearance the chest is essentially unchanged, as above, compatible with multilobar pneumonia. 2. Aortic atherosclerosis. Electronically Signed   By: Trudie Reedaniel  Entrikin M.D.   On: 08/15/2018 08:21   Dg Chest Port 1 View  Result Date: 08/12/2018 CLINICAL DATA:  Acute respiratory failure  EXAM: PORTABLE CHEST 1 VIEW COMPARISON:  Six days ago FINDINGS: Low volume chest. There has been mild improvement in opacity, especially about the right hilum. Reticular density at the right base is better seen, although this is also a more technically clear image. There is still extensive left-sided opacity with architectural distortion, scarring by 2018 chest CT. No edema, effusion, or pneumothorax. Stable heart size which is distorted by scarring. IMPRESSION: Mildly improved aeration. Background chronic lung disease with scarring and low volumes. Electronically Signed   By: Marnee SpringJonathon  Watts M.D.   On: 08/12/2018 05:07   Dg Chest Port 1 View  Result Date: 08/19/2018 CLINICAL DATA:  Fever.  COVID-19 exposure EXAM: PORTABLE CHEST 1 VIEW COMPARISON:  01/25/2017 and CT chest 01/25/2018 FINDINGS: Chronic lung disease with scarring based on prior CT. Superimposed patchy airspace disease bilaterally is new compared with the prior study. No effusion. IMPRESSION: COPD with scarring Superimposed acute airspace disease bilaterally suspicious for pneumonia. Electronically Signed   By: Marlan Palauharles  Clark M.D.   On: 08/20/2018 12:57   Dg Swallowing Func-speech Pathology  Result Date: 08/14/2018 Objective Swallowing Evaluation: Type of Study: MBS-Modified Barium Swallow Study  Patient Details Name: Quentin OreRonald Childress MRN: [id number] Date of Birth: 11-01-1945 Today's Date: 08/14/2018 Time: SLP Start Time (ACUTE ONLY): 1345 -SLP Stop Time (ACUTE ONLY): 1420 SLP Time Calculation (min) (ACUTE ONLY): 35 min Past Medical History: Past Medical History: Diagnosis Date  Cerebral vascular disease   Dementia (HCC)   Schizo affective schizophrenia (HCC)   Seizures (HCC)  Past Surgical History: No past surgical history on file. HPI: Pt is a 73 yo male admitted from [facility] due to concern for psychiatric illness (pt felt like he was going crazy). He was noted to have a fever, tachycardia, and CXR concerning for PNA. COVID test was  positive. PMH includes: CVA, dementia, seizures, schizophrenia, COPD. Pt was observed to cough with thin liquids on 5/18, therefore SLP swallow evaluation was ordered.  Subjective: pt provides conflicting information about PLOF but seems as though he eats very little that requires chewing Assessment / Plan / Recommendation CHL IP CLINICAL IMPRESSIONS 08/14/2018 Clinical Impression Pt presents with a moderate oral/oropharyngeal dysphagia with premature spillage of thin and nectar thick liquids over the base of tongue into the vestibule prior to an otherwise relatively timely and strong swallow initiation. Aspiration of thin liquids was sensed, but with nectar x2, silent with no response. Pt was able to achieve better bolus cohesion for transit with honey thick liquids. Cup sips were less coordinated as pt took several sips, pushing boluses to valleculae without initaiting a swallow. Recommend pt consume dys 2 solids (edentulous but able to masticate with gums) and honey thick liquids with a slow rate controlled by supervising staff member. One sip  at a time or spoon feeding as needed.  SLP Visit Diagnosis Dysphagia, unspecified (R13.10) Attention and concentration deficit following -- Frontal lobe and executive function deficit following -- Impact on safety and function --   CHL IP TREATMENT RECOMMENDATION 08/14/2018 Treatment Recommendations Therapy as outlined in treatment plan below   Prognosis 08/14/2018 Prognosis for Safe Diet Advancement Good Barriers to Reach Goals -- Barriers/Prognosis Comment -- CHL IP DIET RECOMMENDATION 08/14/2018 SLP Diet Recommendations Dysphagia 2 (Fine chop) solids;Honey thick liquids Liquid Administration via Spoon;Cup Medication Administration Crushed with puree Compensations Slow rate;Small sips/bites Postural Changes Remain semi-upright after after feeds/meals (Comment);Seated upright at 90 degrees   CHL IP OTHER RECOMMENDATIONS 08/14/2018 Recommended Consults -- Oral Care  Recommendations Oral care BID Other Recommendations Order thickener from pharmacy   CHL IP FOLLOW UP RECOMMENDATIONS 08/14/2018 Follow up Recommendations Skilled Nursing facility   [facility] IP FREQUENCY AND DURATION 08/14/2018 Speech Therapy Frequency (ACUTE ONLY) min 2x/week Treatment Duration 2 weeks      No flowsheet data found. CHL IP PHARYNGEAL PHASE 08/14/2018 Pharyngeal Phase Impaired Pharyngeal- Pudding Teaspoon -- Pharyngeal -- Pharyngeal- Pudding Cup -- Pharyngeal -- Pharyngeal- Honey Teaspoon Pharyngeal residue - valleculae;Delayed swallow initiation-vallecula Pharyngeal -- Pharyngeal- Honey Cup Delayed swallow initiation-pyriform sinuses Pharyngeal -- Pharyngeal- Nectar Teaspoon Delayed swallow initiation-pyriform sinuses;Penetration/Aspiration before swallow;Trace aspiration Pharyngeal Material enters airway, passes BELOW cords without attempt by patient to eject out (silent aspiration) Pharyngeal- Nectar Cup -- Pharyngeal -- Pharyngeal- Nectar Straw -- Pharyngeal -- Pharyngeal- Thin Teaspoon -- Pharyngeal -- Pharyngeal- Thin Cup -- Pharyngeal -- Pharyngeal- Thin Straw Penetration/Aspiration before swallow;Moderate aspiration;Delayed swallow initiation-pyriform sinuses Pharyngeal Material enters airway, passes BELOW cords and not ejected out despite cough attempt by patient Pharyngeal- Puree Delayed swallow initiation-vallecula Pharyngeal -- Pharyngeal- Mechanical Soft Delayed swallow initiation-vallecula Pharyngeal -- Pharyngeal- Regular -- Pharyngeal -- Pharyngeal- Multi-consistency -- Pharyngeal -- Pharyngeal- Pill -- Pharyngeal -- Pharyngeal Comment --  CHL IP CERVICAL ESOPHAGEAL PHASE 08/14/2018 Cervical Esophageal Phase WFL Pudding Teaspoon -- Pudding Cup -- Honey Teaspoon -- Honey Cup -- Nectar Teaspoon -- Nectar Cup -- Nectar Straw -- Thin Teaspoon -- Thin Cup -- Thin Straw -- Puree -- Mechanical Soft -- Regular -- Multi-consistency -- Pill -- Cervical Esophageal Comment -- Harlon Ditty, MA CCC-SLP  Acute Rehabilitation Services Pager [phone] Office [phone] Claudine Mouton 08/14/2018, 3:50 PM

## 2018-08-16 NOTE — Progress Notes (Signed)
Updated patient's sister Cherylann Ratel as to patient's condition. All questions answered.

## 2018-08-16 NOTE — Progress Notes (Signed)
Patient alert and oriented x4, refused interventions due to respiratory distress.  Oxygen saturations decrease to 79-88 on HFNC at 12 L, throughout shift.  Call received from male family member at shift change to communicate with patient however she did not respond when transferred nor did she return call to facility.  Patient stable condition at shift change.

## 2018-08-17 LAB — BASIC METABOLIC PANEL
Anion gap: 13 (ref 5–15)
BUN: 5 mg/dL — ABNORMAL LOW (ref 8–23)
CO2: 30 mmol/L (ref 22–32)
Calcium: 8.2 mg/dL — ABNORMAL LOW (ref 8.9–10.3)
Chloride: 100 mmol/L (ref 98–111)
Creatinine, Ser: 0.55 mg/dL — ABNORMAL LOW (ref 0.61–1.24)
GFR calc Af Amer: >60 mL/min (ref >60)
GFR calc non Af Amer: >60 mL/min (ref >60)
Glucose, Bld: 89 mg/dL (ref 70–99)
Potassium: 3.2 mmol/L — ABNORMAL LOW (ref 3.5–5.1)
Sodium: 143 mmol/L (ref 135–145)

## 2018-08-17 LAB — PROCALCITONIN: Procalcitonin: <0.1 ng/mL

## 2018-08-17 LAB — MAGNESIUM: Magnesium: 2.2 mg/dL (ref 1.7–2.4)

## 2018-08-17 MED ORDER — HALOPERIDOL 0.5 MG PO TABS
0.5000 mg | ORAL_TABLET | ORAL | Status: DC | PRN
  Filled 2018-08-17: qty 1

## 2018-08-17 MED ORDER — MORPHINE SULFATE (PF) 2 MG/ML IV SOLN
2.0000 mg | INTRAVENOUS | Status: DC | PRN
  Administered 2018-08-17 (×2): 2 mg via INTRAVENOUS
  Filled 2018-08-17 (×2): qty 1

## 2018-08-17 MED ORDER — LORAZEPAM 2 MG/ML PO CONC
1.0000 mg | ORAL | Status: DC | PRN
  Filled 2018-08-17: qty 0.5

## 2018-08-17 MED ORDER — GLYCOPYRROLATE 0.2 MG/ML IJ SOLN: 0.2000 mg | INTRAMUSCULAR | Status: DC | PRN

## 2018-08-17 MED ORDER — HALOPERIDOL LACTATE 2 MG/ML PO CONC
0.5000 mg | ORAL | Status: DC | PRN
  Filled 2018-08-17: qty 0.3

## 2018-08-17 MED ORDER — LORAZEPAM 2 MG/ML IJ SOLN
1.0000 mg | INTRAMUSCULAR | Status: DC | PRN
  Administered 2018-08-17 (×2): 1 mg via INTRAVENOUS
  Filled 2018-08-17 (×2): qty 1

## 2018-08-17 MED ORDER — ONDANSETRON 4 MG PO TBDP: 4.0000 mg | ORAL_TABLET | Freq: Four times a day (QID) | ORAL | Status: DC | PRN

## 2018-08-17 MED ORDER — SODIUM CHLORIDE 0.9% FLUSH
3.0000 mL | Freq: Two times a day (BID) | INTRAVENOUS | Status: DC
  Administered 2018-08-17 – 2018-08-18 (×2): 3 mL via INTRAVENOUS

## 2018-08-17 MED ORDER — LORAZEPAM 2 MG/ML IJ SOLN
1.0000 mg | INTRAMUSCULAR | Status: DC | PRN
  Administered 2018-08-17: 10:00:00 1 mg via INTRAVENOUS
  Filled 2018-08-17: qty 1

## 2018-08-17 MED ORDER — ACETAMINOPHEN 325 MG PO TABS: 650.0000 mg | ORAL_TABLET | Freq: Four times a day (QID) | ORAL | Status: DC | PRN

## 2018-08-17 MED ORDER — MORPHINE BOLUS VIA INFUSION
1.0000 mg | INTRAVENOUS | Status: DC | PRN
  Administered 2018-08-18 (×3): 1 mg via INTRAVENOUS
  Filled 2018-08-17: qty 1

## 2018-08-17 MED ORDER — GLYCOPYRROLATE 1 MG PO TABS
1.0000 mg | ORAL_TABLET | ORAL | Status: DC | PRN
  Filled 2018-08-17: qty 1

## 2018-08-17 MED ORDER — SODIUM CHLORIDE 0.9% FLUSH: 3.0000 mL | INTRAVENOUS | Status: DC | PRN

## 2018-08-17 MED ORDER — DIPHENHYDRAMINE HCL 50 MG/ML IJ SOLN: 12.5000 mg | INTRAMUSCULAR | Status: DC | PRN

## 2018-08-17 MED ORDER — HALOPERIDOL LACTATE 5 MG/ML IJ SOLN: 0.5000 mg | INTRAMUSCULAR | Status: DC | PRN

## 2018-08-17 MED ORDER — SODIUM CHLORIDE 0.9 % IV SOLN: 250.0000 mL | INTRAVENOUS | Status: DC | PRN

## 2018-08-17 MED ORDER — ACETAMINOPHEN 650 MG RE SUPP: 650.0000 mg | Freq: Four times a day (QID) | RECTAL | Status: DC | PRN

## 2018-08-17 MED ORDER — ONDANSETRON HCL 4 MG/2ML IJ SOLN: 4.0000 mg | Freq: Four times a day (QID) | INTRAMUSCULAR | Status: DC | PRN

## 2018-08-17 MED ORDER — VALPROATE SODIUM 500 MG/5ML IV SOLN
500.0000 mg | INTRAVENOUS | Status: DC
  Administered 2018-08-17: 10:00:00 500 mg via INTRAVENOUS
  Filled 2018-08-17 (×2): qty 5

## 2018-08-17 MED ORDER — MORPHINE 100MG IN NS 100ML (1MG/ML) PREMIX INFUSION
1.0000 mg/h | INTRAVENOUS | Status: DC
  Administered 2018-08-17: 09:00:00 1 mg/h via INTRAVENOUS
  Administered 2018-08-17 – 2018-08-18 (×2): 8 mg/h via INTRAVENOUS
  Filled 2018-08-17 (×4): qty 100

## 2018-08-17 MED ORDER — POLYVINYL ALCOHOL 1.4 % OP SOLN
1.0000 [drp] | Freq: Four times a day (QID) | OPHTHALMIC | Status: DC | PRN
  Filled 2018-08-17: qty 15

## 2018-08-17 MED ORDER — LORAZEPAM 1 MG PO TABS: 1.0000 mg | ORAL_TABLET | ORAL | Status: DC | PRN

## 2018-08-17 NOTE — Progress Notes (Signed)
PROGRESS NOTE                                                                                                                                                                                                             Patient Demographics:    Mason May, is a 73 y.o. male, DOB - Jan 24, 1946, ZOX:[id number]  Outpatient Primary MD for the patient is Lance Bosch, MD    LOS - 11  No chief complaint on file.      Brief Narrative: Patient is a 73 y.o. male with PMHx of CVA, dementia, seizures, schizophrenia, interstitial lung disease on CellCept, COPD, chronic hypoxemic respiratory failure on 4 L of oxygen at home-who presented to the emergency room with fever, multifocal pneumonia on x-ray-found to have COVID 19 pneumonia and transferred to the hospitalist service at [facility].  Unfortunately, hospital course complicated by worsening hypoxia-in the setting of ongoing COVID-19 pneumonia and aspiration pneumonitis.  Due to continued deterioration-patient was transitioned to full comfort measures on 5/25.   Subjective:    [name] seems most worse than yesterday.  He is tachypneic-grunting-at times struggling to breathe.  He was started on morphine pushes last night.   Assessment  & Plan :   Acute hypoxemic respiratory failure secondary to COVID 19 viral pneumonia and aspiration pneumonia: Markedly worse today-overtly aspirating-uncomfortable-100% nonrebreather mask O2 saturations remain in the mid 80s.  Do not think antimicrobial therapy will change outcome-I have reached out to the patient's sister-she is aware that the patient is actively dying.  We have agreed to transition to full comfort measures-starting morphine infusion.  Expect inpatient death in a matterer of a few hours a days.  Sister aware of visitation policies for dying patients-she does not wish to come to the hospital at this point.  COVID-19 Labs:  Recent Labs      08/15/18 0630 08/16/18 0859  CRP <0.8 <0.8    Lab Results  Component Value Date   SARSCOV2NAA POSITIVE (A) 08/17/2018     COVID-19 Medications: 5/14>>Actemra 5/14>>5/16 Solumedrol  Hypokalemia: No need to replete as comfort measures started.  Hypernatremia: Resolved with D5W.  Hypotension: Resolved.   Acute metabolic encephalopathy: Secondary to pneumonia and hypoxia, initially improved with supportive care-but has worsened this morning and appears very lethargic.    Schizophrenia and schizoaffective disorder,/hallucination: Since her transition to  comfort measures-stopping all psychotropic medications  History of seizures: Continue Depakote-we will change to IV route to ensure no seizures when comfort measures are in effect.  Hypothyroidism: Stopping levothyroxine-comfort measures started  Hyperlipidemia:  no role for statins-discontinue-as comfort measures started  BPH: Stop Flomax-comfort measures  Stage2 sacral pressure ulcer: POA. Offload as able.  Generalized weakness/deconditioning/dysphagia/failure to thrive syndrome: Severely deconditioned and debility due to acute illness-has worsening dysphagia and resultant aspiration pneumonia.  Actively dying this morning-has taken a turn for the worse overnight.  Starting comfort measures-on morphine infusion.  Long discussion with sister over the phone this morning.    ABG:    Component Value Date/Time   PHART 7.40 03/28/2016 1605   PCO2ART 52 (H) 03/28/2016 1605   PO2ART 81 (L) 03/28/2016 1605   HCO3 32.2 (H) 03/28/2016 1605   O2SAT 95.9 03/28/2016 1605    Condition - Prognosis is poor  Family Communication  :  Sister updated over the phone  Code Status :  DNR  Diet : comfort feedings  Disposition Plan  :  Remain inpatient-expect inpatient death  Consults  :  None  Procedures  :  None  DVT Prophylaxis  : Discontinue Lovenox  Lab Results  Component Value Date   PLT 146 (L) 08/16/2018     Inpatient Medications  Scheduled Meds:  sodium chloride flush  3 mL Intravenous Q12H   Continuous Infusions:  sodium chloride     morphine 1 mg/hr (08/17/18 0925)   valproate sodium 500 mg (08/17/18 0941)   PRN Meds:.sodium chloride, acetaminophen **OR** acetaminophen, diphenhydrAMINE, glycopyrrolate **OR** glycopyrrolate **OR** glycopyrrolate, haloperidol **OR** haloperidol **OR** haloperidol lactate, Ipratropium-Albuterol, LORazepam **OR** LORazepam **OR** LORazepam, LORazepam, morphine, ondansetron **OR** ondansetron (ZOFRAN) IV, polyvinyl alcohol, sodium chloride flush  Antibiotics  :    Anti-infectives (From admission, onward)   Start     Dose/Rate Route Frequency Ordered Stop   08/11/18 2000  Ampicillin-Sulbactam (UNASYN) 3 g in sodium chloride 0.9 % 100 mL IVPB  Status:  Discontinued     3 g 200 mL/hr over 30 Minutes Intravenous Every 6 hours 08/11/18 1952 08/17/18 0853   08/08/18 2000  azithromycin (ZITHROMAX) tablet 500 mg  Status:  Discontinued     500 mg Oral Every 24 hours 08/08/18 1309 08/09/18 1055   08/07/2018 1930  azithromycin (ZITHROMAX) 500 mg in sodium chloride 0.9 % 250 mL IVPB  Status:  Discontinued     500 mg 250 mL/hr over 60 Minutes Intravenous Every 24 hours 08/20/2018 1928 08/08/18 1309       Time Spent in minutes 35    Jeoffrey MassedShanker Mellonie Guess M.D on 08/17/2018 at 10:29 AM  To page go to [email or web address] - use universal password  [facility] -  Office  [phone]   See all Orders from today for further details  Admit date - 07/28/2018    11    Objective:   Vitals:   08/17/18 0604 08/17/18 0610 08/17/18 0628 08/17/18 1017  BP:    104/68  Pulse: (!) 111 (!) 114 (!) 102   Resp:      Temp:      TempSrc:      SpO2: (!) 73% (!) 70% 90%   Weight:      Height:        Wt Readings from Last 3 Encounters:  08/17/18 90.3 kg  08/12/2018 84.4 kg  01/25/17 92.5 kg     Intake/Output Summary (Last 24 hours) at 08/17/2018 1029 Last data filed  at 08/17/2018 [id number] Gross  per 24 hour  Intake 2023.98 ml  Output 700 ml  Net 1323.98 ml     Physical Exam General appearance: Very lethargic-tachypneic  Eyes:no scleral icterus. HEENT: Atraumatic and Normocephalic Neck: supple. Resp:Good air entry bilaterally, significant amount of transmitted upper airway sounds-appears to have rales all over CVS: S1 S2 regular GI: Bowel sounds present, Non tender and not distended with no gaurding, rigidity or rebound. Extremities: B/L Lower Ext shows no edema, both legs are warm to touch Neurology:  Non focal-but with significant amount of generalized weakness Musculoskeletal:No digital cyanosis Skin:No Rash, warm and dry Wounds:N/A   Data Review:    CBC Recent Labs  Lab 08/12/18 0342 08/15/18 0630 08/16/18 0755  WBC 3.5* 4.1 4.8  HGB 12.3* 12.8* 11.6*  HCT 39.7 42.6 37.8*  PLT 176 180 146*  MCV 95.4 95.9 94.7  MCH 29.6 28.8 29.1  MCHC 31.0 30.0 30.7  RDW 14.2 14.7 14.7  LYMPHSABS 1.0 1.1  --   MONOABS 0.3 0.8  --   EOSABS 0.0 0.1  --   BASOSABS 0.0 0.0  --     Chemistries  Recent Labs  Lab 08/10/18 1944 08/11/18 0547 08/12/18 0342 08/15/18 0630 08/16/18 0755 08/17/18 0240  NA  --  140 142 150* 142 143  K  --  3.7 3.6 2.4* 2.5* 3.2*  CL  --  102 102 103 97* 100  CO2  --  30 31 36* 34* 30  GLUCOSE  --  101* 76 104* 104* 89  BUN  --  6* 5*  CREATININE  --  0.41* 0.40* 0.54* 0.44* 0.55*  CALCIUM  --  8.7* 8.5* 8.6* 7.8* 8.2*  MG 1.9 1.8  --  2.1 1.6* 2.2  AST  --  49* 61* 58*  --   --   ALT  --  27 32 35  --   --   ALKPHOS  --  46 44 53  --   --   BILITOT  --  0.4 0.4 1.0  --   --    ------------------------------------------------------------------------------------------------------------------ No results for input(s): CHOL, HDL, LDLCALC, TRIG, CHOLHDL, LDLDIRECT in the last 72 hours.  No results found for:  HGBA1C ------------------------------------------------------------------------------------------------------------------ No results for input(s): TSH, T4TOTAL, T3FREE, THYROIDAB in the last 72 hours.  Invalid input(s): FREET3 ------------------------------------------------------------------------------------------------------------------ No results for input(s): VITAMINB12, FOLATE, FERRITIN, TIBC, IRON, RETICCTPCT in the last 72 hours.  Coagulation profile No results for input(s): INR, PROTIME in the last 168 hours.  No results for input(s): DDIMER in the last 72 hours.  Cardiac Enzymes No results for input(s): CKMB, TROPONINI, MYOGLOBIN in the last 168 hours.  Invalid input(s): CK ------------------------------------------------------------------------------------------------------------------    Component Value Date/Time   BNP 11.0 01/25/2017 1221    Micro Results No results found for this or any previous visit (from the past 240 hour(s)).  Radiology Reports Dg Chest Port 1 View  Result Date: 08/15/2018 CLINICAL DATA:  73 year old male with history of acute respiratory disease secondary to COVID-19 infection. Aspiration pneumonia. EXAM: PORTABLE CHEST 1 VIEW COMPARISON:  Chest x-ray 08/12/2018. FINDINGS: Low lung volumes. Extensive airspace consolidation with air bronchograms and evidence of bronchiectasis in the left lower lobe. Other patchy areas of interstitial prominence and ill-defined airspace disease also noted in the right upper lobe and medial aspect of the right lung base. Small left pleural effusion. No evidence of pulmonary edema. Heart size is normal. Upper mediastinal contours are within normal limits. Aortic atherosclerosis. Postoperative changes of right upper  extremity ORIF incompletely imaged. IMPRESSION: 1. Radiographic appearance the chest is essentially unchanged, as above, compatible with multilobar pneumonia. 2. Aortic atherosclerosis. Electronically Signed    By: Trudie Reed M.D.   On: 08/15/2018 08:21   Dg Chest Port 1 View  Result Date: 08/12/2018 CLINICAL DATA:  Acute respiratory failure EXAM: PORTABLE CHEST 1 VIEW COMPARISON:  Six days ago FINDINGS: Low volume chest. There has been mild improvement in opacity, especially about the right hilum. Reticular density at the right base is better seen, although this is also a more technically clear image. There is still extensive left-sided opacity with architectural distortion, scarring by 2018 chest CT. No edema, effusion, or pneumothorax. Stable heart size which is distorted by scarring. IMPRESSION: Mildly improved aeration. Background chronic lung disease with scarring and low volumes. Electronically Signed   By: Marnee Spring M.D.   On: 08/12/2018 05:07   Dg Chest Port 1 View  Result Date: 08/15/2018 CLINICAL DATA:  Fever.  COVID-19 exposure EXAM: PORTABLE CHEST 1 VIEW COMPARISON:  01/25/2017 and CT chest 01/25/2018 FINDINGS: Chronic lung disease with scarring based on prior CT. Superimposed patchy airspace disease bilaterally is new compared with the prior study. No effusion. IMPRESSION: COPD with scarring Superimposed acute airspace disease bilaterally suspicious for pneumonia. Electronically Signed   By: Marlan Palau M.D.   On: 08/04/2018 12:57   Dg Swallowing Func-speech Pathology  Result Date: 08/14/2018 Objective Swallowing Evaluation: Type of Study: MBS-Modified Barium Swallow Study  Patient Details Name: Kyzer Krotz MRN: [id number] Date of Birth: 1945/07/25 Today's Date: 08/14/2018 Time: SLP Start Time (ACUTE ONLY): 1345 -SLP Stop Time (ACUTE ONLY): 1420 SLP Time Calculation (min) (ACUTE ONLY): 35 min Past Medical History: Past Medical History: Diagnosis Date  Cerebral vascular disease   Dementia (HCC)   Schizo affective schizophrenia (HCC)   Seizures (HCC)  Past Surgical History: No past surgical history on file. HPI: Pt is a 73 yo male admitted from [facility] due to concern for  psychiatric illness (pt felt like he was going crazy). He was noted to have a fever, tachycardia, and CXR concerning for PNA. COVID test was positive. PMH includes: CVA, dementia, seizures, schizophrenia, COPD. Pt was observed to cough with thin liquids on 5/18, therefore SLP swallow evaluation was ordered.  Subjective: pt provides conflicting information about PLOF but seems as though he eats very little that requires chewing Assessment / Plan / Recommendation CHL IP CLINICAL IMPRESSIONS 08/14/2018 Clinical Impression Pt presents with a moderate oral/oropharyngeal dysphagia with premature spillage of thin and nectar thick liquids over the base of tongue into the vestibule prior to an otherwise relatively timely and strong swallow initiation. Aspiration of thin liquids was sensed, but with nectar x2, silent with no response. Pt was able to achieve better bolus cohesion for transit with honey thick liquids. Cup sips were less coordinated as pt took several sips, pushing boluses to valleculae without initaiting a swallow. Recommend pt consume dys 2 solids (edentulous but able to masticate with gums) and honey thick liquids with a slow rate controlled by supervising staff member. One sip at a time or spoon feeding as needed.  SLP Visit Diagnosis Dysphagia, unspecified (R13.10) Attention and concentration deficit following -- Frontal lobe and executive function deficit following -- Impact on safety and function --   CHL IP TREATMENT RECOMMENDATION 08/14/2018 Treatment Recommendations Therapy as outlined in treatment plan below   Prognosis 08/14/2018 Prognosis for Safe Diet Advancement Good Barriers to Reach Goals -- Barriers/Prognosis Comment -- CHL  IP DIET RECOMMENDATION 08/14/2018 SLP Diet Recommendations Dysphagia 2 (Fine chop) solids;Honey thick liquids Liquid Administration via Spoon;Cup Medication Administration Crushed with puree Compensations Slow rate;Small sips/bites Postural Changes Remain semi-upright after  after feeds/meals (Comment);Seated upright at 90 degrees   CHL IP OTHER RECOMMENDATIONS 08/14/2018 Recommended Consults -- Oral Care Recommendations Oral care BID Other Recommendations Order thickener from pharmacy   CHL IP FOLLOW UP RECOMMENDATIONS 08/14/2018 Follow up Recommendations Skilled Nursing facility   [facility] IP FREQUENCY AND DURATION 08/14/2018 Speech Therapy Frequency (ACUTE ONLY) min 2x/week Treatment Duration 2 weeks      No flowsheet data found. CHL IP PHARYNGEAL PHASE 08/14/2018 Pharyngeal Phase Impaired Pharyngeal- Pudding Teaspoon -- Pharyngeal -- Pharyngeal- Pudding Cup -- Pharyngeal -- Pharyngeal- Honey Teaspoon Pharyngeal residue - valleculae;Delayed swallow initiation-vallecula Pharyngeal -- Pharyngeal- Honey Cup Delayed swallow initiation-pyriform sinuses Pharyngeal -- Pharyngeal- Nectar Teaspoon Delayed swallow initiation-pyriform sinuses;Penetration/Aspiration before swallow;Trace aspiration Pharyngeal Material enters airway, passes BELOW cords without attempt by patient to eject out (silent aspiration) Pharyngeal- Nectar Cup -- Pharyngeal -- Pharyngeal- Nectar Straw -- Pharyngeal -- Pharyngeal- Thin Teaspoon -- Pharyngeal -- Pharyngeal- Thin Cup -- Pharyngeal -- Pharyngeal- Thin Straw Penetration/Aspiration before swallow;Moderate aspiration;Delayed swallow initiation-pyriform sinuses Pharyngeal Material enters airway, passes BELOW cords and not ejected out despite cough attempt by patient Pharyngeal- Puree Delayed swallow initiation-vallecula Pharyngeal -- Pharyngeal- Mechanical Soft Delayed swallow initiation-vallecula Pharyngeal -- Pharyngeal- Regular -- Pharyngeal -- Pharyngeal- Multi-consistency -- Pharyngeal -- Pharyngeal- Pill -- Pharyngeal -- Pharyngeal Comment --  CHL IP CERVICAL ESOPHAGEAL PHASE 08/14/2018 Cervical Esophageal Phase WFL Pudding Teaspoon -- Pudding Cup -- Honey Teaspoon -- Honey Cup -- Nectar Teaspoon -- Nectar Cup -- Nectar Straw -- Thin Teaspoon -- Thin Cup -- Thin Straw  -- Puree -- Mechanical Soft -- Regular -- Multi-consistency -- Pill -- Cervical Esophageal Comment -- Harlon Ditty, MA CCC-SLP Acute Rehabilitation Services Pager [phone] Office [phone] Claudine Mouton 08/14/2018, 3:50 PM

## 2018-08-24 NOTE — Progress Notes (Addendum)
Family notified of the patients death and the physician.  Wasted 34ml of morphine with debra RN

## 2018-08-24 NOTE — Progress Notes (Signed)
Around 2130 08/17/2018 pt had episode where oxygen saturations dropped in the 40-50s. Pt alert and verbal throughout. O2 increased to 10L via HFNC. Pt refused nonrebreather and verbalized understanding seriousness of oxygen saturation and need form oxygen. Morphine increased to 8cc/hr. MD rounded around 2230 and saw patient and aware of current status. Pt rests quietly at intervals. Other than short 20-30 min interval pt has appeared very comfortable.

## 2018-08-24 NOTE — Progress Notes (Signed)
Patients sister Mason May called wanting an update because she was told that he would possibly pass last night and when no on called she got worried. Answered all questions and concerns. Informed her that he is still alive and that we are doing our best to keep him comfortable and out of pain as much as possible. Funeral arrangements have already been made.

## 2018-08-24 NOTE — Death Summary Note (Signed)
DEATH SUMMARY   Patient Details  Name: Mason May MRN: [id number] DOB: [birth year]  Admission/Discharge Information   Admit Date:  08/20/2018  Date of Death: Date of Death: Mar 16, 2019  Time of Death: Time of Death: 1855  Length of Stay: 12  Referring Physician: Lance BoschWarshaw, Gregg A, MD   Reason(s) for Hospitalization  Acute hypoxemic respiratory failure secondary to COVID 19 viral pneumonia   Diagnoses  Preliminary cause of death:   Acute hypoxemic respiratory failure secondary to COVID 19 viral pneumonia   Secondary Diagnoses (including complications and co-morbidities):  Principal Problem:   COVID-19 virus infection Active Problems:   Seizure (HCC)   Schizo affective schizophrenia (HCC)   Chronic respiratory failure with hypoxia (HCC)   Pneumonia   Hypothyroidism   Interstitial lung disease (HCC)   COVID-19   Pressure injury of skin   Brief Hospital Course (including significant findings, care, treatment, and services provided and events leading to death)  Patient is a 73 y.o. male with PMHx of CVA, dementia, seizures, schizophrenia, interstitial lung disease on CellCept, COPD, chronic hypoxemic respiratory failure on 4 L of oxygen at home-who presented to the emergency room with fever, multifocal pneumonia on x-ray-found to have COVID 19 pneumonia and [facility].  Unfortunately, hospital course complicated by worsening hypoxia-in the setting of ongoing COVID-19 pneumonia and aspiration pneumonitis.  Due to continued deterioration-patient was transitioned to full comfort measures on 5/25 and started on Morphine infusion.Patient subsequently expired on 5/26.   Pertinent Labs and Studies  Significant Diagnostic Studies Dg Chest Port 1 View  Result Date: 08/15/2018 CLINICAL DATA:  73 year old male with history of acute respiratory disease secondary to COVID-19 infection. Aspiration pneumonia. EXAM: PORTABLE CHEST 1 VIEW COMPARISON:  Chest x-ray  08/12/2018. FINDINGS: Low lung volumes. Extensive airspace consolidation with air bronchograms and evidence of bronchiectasis in the left lower lobe. Other patchy areas of interstitial prominence and ill-defined airspace disease also noted in the right upper lobe and medial aspect of the right lung base. Small left pleural effusion. No evidence of pulmonary edema. Heart size is normal. Upper mediastinal contours are within normal limits. Aortic atherosclerosis. Postoperative changes of right upper extremity ORIF incompletely imaged. IMPRESSION: 1. Radiographic appearance the chest is essentially unchanged, as above, compatible with multilobar pneumonia. 2. Aortic atherosclerosis. Electronically Signed   By: Trudie Reedaniel  Entrikin M.D.   On: 08/15/2018 08:21   Dg Chest Port 1 View  Result Date: 08/12/2018 CLINICAL DATA:  Acute respiratory failure EXAM: PORTABLE CHEST 1 VIEW COMPARISON:  Six days ago FINDINGS: Low volume chest. There has been mild improvement in opacity, especially about the right hilum. Reticular density at the right base is better seen, although this is also a more technically clear image. There is still extensive left-sided opacity with architectural distortion, scarring by 2018 chest CT. No edema, effusion, or pneumothorax. Stable heart size which is distorted by scarring. IMPRESSION: Mildly improved aeration. Background chronic lung disease with scarring and low volumes. Electronically Signed   By: Marnee SpringJonathon  Watts M.D.   On: 08/12/2018 05:07   Dg Chest Port 1 View  Result Date: 08/20/2018 CLINICAL DATA:  Fever.  COVID-19 exposure EXAM: PORTABLE CHEST 1 VIEW COMPARISON:  01/25/2017 and CT chest 01/25/2018 FINDINGS: Chronic lung disease with scarring based on prior CT. Superimposed patchy airspace disease bilaterally is new compared with the prior study. No effusion. IMPRESSION: COPD with scarring Superimposed acute airspace disease bilaterally suspicious for pneumonia. Electronically Signed    By: Marlan Palauharles  Clark  M.D.   On: 08-20-2018 12:57   Dg Swallowing Func-speech Pathology  Result Date: 08/14/2018 Objective Swallowing Evaluation: Type of Study: MBS-Modified Barium Swallow Study  Patient Details Name: Mason May MRN: [id number] Date of Birth: 1945-07-09 Today's Date: 08/14/2018 Time: SLP Start Time (ACUTE ONLY): 1345 -SLP Stop Time (ACUTE ONLY): 1420 SLP Time Calculation (min) (ACUTE ONLY): 35 min Past Medical History: Past Medical History: Diagnosis Date . Cerebral vascular disease  . Dementia (HCC)  . Schizo affective schizophrenia (HCC)  . Seizures (HCC)  Past Surgical History: No past surgical history on file. HPI: Pt is a 73 yo male admitted from [facility] due to concern for psychiatric illness (pt felt like he was going crazy). He was noted to have a fever, tachycardia, and CXR concerning for PNA. COVID test was positive. PMH includes: CVA, dementia, seizures, schizophrenia, COPD. Pt was observed to cough with thin liquids on 5/18, therefore SLP swallow evaluation was ordered.  Subjective: pt provides conflicting information about PLOF but seems as though he eats very little that requires chewing Assessment / Plan / Recommendation CHL IP CLINICAL IMPRESSIONS 08/14/2018 Clinical Impression Pt presents with a moderate oral/oropharyngeal dysphagia with premature spillage of thin and nectar thick liquids over the base of tongue into the vestibule prior to an otherwise relatively timely and strong swallow initiation. Aspiration of thin liquids was sensed, but with nectar x2, silent with no response. Pt was able to achieve better bolus cohesion for transit with honey thick liquids. Cup sips were less coordinated as pt took several sips, pushing boluses to valleculae without initaiting a swallow. Recommend pt consume dys 2 solids (edentulous but able to masticate with gums) and honey thick liquids with a slow rate controlled by supervising staff member. One sip at a time or spoon feeding as  needed.  SLP Visit Diagnosis Dysphagia, unspecified (R13.10) Attention and concentration deficit following -- Frontal lobe and executive function deficit following -- Impact on safety and function --   CHL IP TREATMENT RECOMMENDATION 08/14/2018 Treatment Recommendations Therapy as outlined in treatment plan below   Prognosis 08/14/2018 Prognosis for Safe Diet Advancement Good Barriers to Reach Goals -- Barriers/Prognosis Comment -- CHL IP DIET RECOMMENDATION 08/14/2018 SLP Diet Recommendations Dysphagia 2 (Fine chop) solids;Honey thick liquids Liquid Administration via Spoon;Cup Medication Administration Crushed with puree Compensations Slow rate;Small sips/bites Postural Changes Remain semi-upright after after feeds/meals (Comment);Seated upright at 90 degrees   CHL IP OTHER RECOMMENDATIONS 08/14/2018 Recommended Consults -- Oral Care Recommendations Oral care BID Other Recommendations Order thickener from pharmacy   CHL IP FOLLOW UP RECOMMENDATIONS 08/14/2018 Follow up Recommendations Skilled Nursing facility   [facility] IP FREQUENCY AND DURATION 08/14/2018 Speech Therapy Frequency (ACUTE ONLY) min 2x/week Treatment Duration 2 weeks      No flowsheet data found. CHL IP PHARYNGEAL PHASE 08/14/2018 Pharyngeal Phase Impaired Pharyngeal- Pudding Teaspoon -- Pharyngeal -- Pharyngeal- Pudding Cup -- Pharyngeal -- Pharyngeal- Honey Teaspoon Pharyngeal residue - valleculae;Delayed swallow initiation-vallecula Pharyngeal -- Pharyngeal- Honey Cup Delayed swallow initiation-pyriform sinuses Pharyngeal -- Pharyngeal- Nectar Teaspoon Delayed swallow initiation-pyriform sinuses;Penetration/Aspiration before swallow;Trace aspiration Pharyngeal Material enters airway, passes BELOW cords without attempt by patient to eject out (silent aspiration) Pharyngeal- Nectar Cup -- Pharyngeal -- Pharyngeal- Nectar Straw -- Pharyngeal -- Pharyngeal- Thin Teaspoon -- Pharyngeal -- Pharyngeal- Thin Cup -- Pharyngeal -- Pharyngeal- Thin Straw  Penetration/Aspiration before swallow;Moderate aspiration;Delayed swallow initiation-pyriform sinuses Pharyngeal Material enters airway, passes BELOW cords and not ejected out despite cough attempt by patient Pharyngeal- Puree Delayed swallow initiation-vallecula Pharyngeal --  Pharyngeal- Mechanical Soft Delayed swallow initiation-vallecula Pharyngeal -- Pharyngeal- Regular -- Pharyngeal -- Pharyngeal- Multi-consistency -- Pharyngeal -- Pharyngeal- Pill -- Pharyngeal -- Pharyngeal Comment --  CHL IP CERVICAL ESOPHAGEAL PHASE 08/14/2018 Cervical Esophageal Phase WFL Pudding Teaspoon -- Pudding Cup -- Honey Teaspoon -- Honey Cup -- Nectar Teaspoon -- Nectar Cup -- Nectar Straw -- Thin Teaspoon -- Thin Cup -- Thin Straw -- Puree -- Mechanical Soft -- Regular -- Multi-consistency -- Pill -- Cervical Esophageal Comment -- Harlon Ditty, MA CCC-SLP Acute Rehabilitation Services Pager [phone] Office [phone] Claudine Mouton 08/14/2018, 3:50 PM               Microbiology No results found for this or any previous visit (from the past 240 hour(s)).  Lab Basic Metabolic Panel: Recent Labs  Lab 08/15/18 0630 08/16/18 0755 08/17/18 0240  NA 150* 142 143  K 2.4* 2.5* 3.2*  CL 103 97* 100  CO2 36* 34* 30  GLUCOSE 104* 104* 89  BUN 9 6* 5*  CREATININE 0.54* 0.44* 0.55*  CALCIUM 8.6* 7.8* 8.2*  MG 2.1 1.6* 2.2   Liver Function Tests: Recent Labs  Lab 08/15/18 0630  AST 58*  ALT 35  ALKPHOS 53  BILITOT 1.0  PROT 6.3*  ALBUMIN 3.6   No results for input(s): LIPASE, AMYLASE in the last 168 hours. No results for input(s): AMMONIA in the last 168 hours. CBC: Recent Labs  Lab 08/15/18 0630 08/16/18 0755  WBC 4.1 4.8  NEUTROABS 2.0  --   HGB 12.8* 11.6*  HCT 42.6 37.8*  MCV 95.9 94.7  PLT 180 146*   Cardiac Enzymes: No results for input(s): CKTOTAL, CKMB, CKMBINDEX, TROPONINI in the last 168 hours. Sepsis Labs: Recent Labs  Lab 08/15/18 0630 08/16/18 0755 08/17/18 0240   PROCALCITON  --  <0.10 <0.10  WBC 4.1 4.8  --     Procedures/Operations    Natika Geyer 08/19/2018, 3:18 PM

## 2018-08-24 NOTE — Progress Notes (Addendum)
PROGRESS NOTE                                                                                                                                                                                                             Patient Demographics:    Mason May, is a 73 y.o. male, DOB - [birth year], WIO:[id number]  Outpatient Primary MD for the patient is Lance Bosch, MD    LOS - 12  No chief complaint on file.      Brief Narrative: Patient is a 73 y.o. male with PMHx of CVA, dementia, seizures, schizophrenia, interstitial lung disease on CellCept, COPD, chronic hypoxemic respiratory failure on 4 L of oxygen at home-who presented to the emergency room with fever, multifocal pneumonia on x-ray-found to have COVID 19 pneumonia and transferred to the hospitalist service at [facility].  Unfortunately, hospital course complicated by worsening hypoxia-in the setting of ongoing COVID-19 pneumonia and aspiration pneumonitis.  Due to continued deterioration-patient was transitioned to full comfort measures on 5/25.   Subjective:    Mason May  Assessment  & Plan :   Acute hypoxemic respiratory failure secondary to COVID 19 viral pneumonia and aspiration pneumonia: Continues to slowly worsen-seems comfortable-on morphine infusion-full comfort measures in effect.  Suspect patient death in a few hours/days.  Life expectancy only a few hours or days.  COVID-19 Labs:  Recent Labs    08/16/18 0859  CRP <0.8    Lab Results  Component Value Date   SARSCOV2NAA POSITIVE (A) 08/08/2018     COVID-19 Medications: 5/14>>Actemra 5/14>>5/16 Solumedrol  Hypokalemia: No need to replete as comfort measures started.  Hypernatremia: Resolved with D5W.  Hypotension: Resolved.   Acute metabolic encephalopathy: Secondary to pneumonia and hypoxia, initially improved with supportive care-but has worsened this morning and appears very lethargic.     Schizophrenia and schizoaffective disorder,/hallucination: Since her transition to comfort measures-stopping all psychotropic medications  History of seizures: Continue Depakote-we will change to IV route to ensure no seizures when comfort measures are in effect.  Hypothyroidism: Stopping levothyroxine-comfort measures started  Hyperlipidemia:  no role for statins-discontinue-as comfort measures started  BPH: Stop Flomax-comfort measures  Stage2 sacral pressure ulcer: POA. Offload as able.  Generalized weakness/deconditioning/dysphagia/failure to thrive syndrome: Severely deconditioned and debility due to acute illness-has worsening dysphagia and resultant aspiration pneumonia.  Actively dying this morning-has  taken a turn for the worse overnight.  Starting comfort measures-on morphine infusion.  Long discussion with sister over the phone this morning.    ABG:    Component Value Date/Time   PHART 7.40 03/28/2016 1605   PCO2ART 52 (H) 03/28/2016 1605   PO2ART 81 (L) 03/28/2016 1605   HCO3 32.2 (H) 03/28/2016 1605   O2SAT 95.9 03/28/2016 1605    Condition - Prognosis is poor  Family Communication  :  Sister updated over the phone on 5/26  Code Status :  DNR  Diet : comfort feedings  Disposition Plan  :  Remain inpatient-expect inpatient death  Consults  :  None  Procedures  :  None  DVT Prophylaxis  : Discontinue Lovenox  Lab Results  Component Value Date   PLT 146 (L) 08/16/2018    Inpatient Medications  Scheduled Meds:  sodium chloride flush  3 mL Intravenous Q12H   Continuous Infusions:  sodium chloride     morphine 8 mg/hr (08/06/2018 0744)   valproate sodium Stopped (08/14/2018 1052)   PRN Meds:.sodium chloride, acetaminophen **OR** acetaminophen, diphenhydrAMINE, glycopyrrolate **OR** glycopyrrolate **OR** glycopyrrolate, haloperidol **OR** haloperidol **OR** haloperidol lactate, Ipratropium-Albuterol, LORazepam **OR** LORazepam **OR**  LORazepam, LORazepam, morphine, ondansetron **OR** ondansetron (ZOFRAN) IV, polyvinyl alcohol, sodium chloride flush  Antibiotics  :    Anti-infectives (From admission, onward)   Start     Dose/Rate Route Frequency Ordered Stop   08/11/18 2000  Ampicillin-Sulbactam (UNASYN) 3 g in sodium chloride 0.9 % 100 mL IVPB  Status:  Discontinued     3 g 200 mL/hr over 30 Minutes Intravenous Every 6 hours 08/11/18 1952 08/17/18 0853   08/08/18 2000  azithromycin (ZITHROMAX) tablet 500 mg  Status:  Discontinued     500 mg Oral Every 24 hours 08/08/18 1309 08/09/18 1055   2018/08/11 1930  azithromycin (ZITHROMAX) 500 mg in sodium chloride 0.9 % 250 mL IVPB  Status:  Discontinued     500 mg 250 mL/hr over 60 Minutes Intravenous Every 24 hours 08-11-18 1928 08/08/18 1309       Time Spent in minutes 15    Jeoffrey Massed M.D on 08/23/2018 at 11:57 AM  To page go to [email or web address] - use universal password  [facility] -  Office  [phone]   See all Orders from today for further details  Admit date - 08/11/2018    12    Objective:   Vitals:   08/17/18 1632 08/17/18 2118 08/15/2018 0500 08/01/2018 0742  BP: 116/70 122/76  103/65  Pulse:  (!) 125 100 (!) 104  Resp:   20   Temp: 97.8 F (36.6 C) 98 F (36.7 C) (!) 96.3 F (35.7 C) 98.8 F (37.1 C)  TempSrc: Oral Oral Axillary Axillary  SpO2:   (!) 82% (!) 86%  Weight:      Height:        Wt Readings from Last 3 Encounters:  08/17/18 90.3 kg  Aug 11, 2018 84.4 kg  01/25/17 92.5 kg     Intake/Output Summary (Last 24 hours) at 08/11/2018 1157 Last data filed at 08/03/2018 0500 Gross per 24 hour  Intake 198.6 ml  Output 450 ml  Net -251.4 ml     Physical Exam General appearance: Unresponsive   Data Review:    CBC Recent Labs  Lab 08/12/18 0342 08/15/18 0630 08/16/18 0755  WBC 3.5* 4.1 4.8  HGB 12.3* 12.8* 11.6*  HCT 39.7 42.6 37.8*  PLT 176 180 146*  MCV 95.4 95.9 94.7  MCH  29.6 28.8 29.1  MCHC 31.0 30.0 30.7    RDW 14.2 14.7 14.7  LYMPHSABS 1.0 1.1  --   MONOABS 0.3 0.8  --   EOSABS 0.0 0.1  --   BASOSABS 0.0 0.0  --     Chemistries  Recent Labs  Lab 08/12/18 0342 08/15/18 0630 08/16/18 0755 08/17/18 0240  NA 142 150* 142 143  K 3.6 2.4* 2.5* 3.2*  CL 102 103 97* 100  CO2 31 36* 34* 30  GLUCOSE 76 104* 104* 89  BUN 18 9 6* 5*  CREATININE 0.40* 0.54* 0.44* 0.55*  CALCIUM 8.5* 8.6* 7.8* 8.2*  MG  --  2.1 1.6* 2.2  AST 61* 58*  --   --   ALT 32 35  --   --   ALKPHOS 44 53  --   --   BILITOT 0.4 1.0  --   --    ------------------------------------------------------------------------------------------------------------------ No results for input(s): CHOL, HDL, LDLCALC, TRIG, CHOLHDL, LDLDIRECT in the last 72 hours.  No results found for: HGBA1C ------------------------------------------------------------------------------------------------------------------ No results for input(s): TSH, T4TOTAL, T3FREE, THYROIDAB in the last 72 hours.  Invalid input(s): FREET3 ------------------------------------------------------------------------------------------------------------------ No results for input(s): VITAMINB12, FOLATE, FERRITIN, TIBC, IRON, RETICCTPCT in the last 72 hours.  Coagulation profile No results for input(s): INR, PROTIME in the last 168 hours.  No results for input(s): DDIMER in the last 72 hours.  Cardiac Enzymes No results for input(s): CKMB, TROPONINI, MYOGLOBIN in the last 168 hours.  Invalid input(s): CK ------------------------------------------------------------------------------------------------------------------    Component Value Date/Time   BNP 11.0 01/25/2017 1221    Micro Results No results found for this or any previous visit (from the past 240 hour(s)).  Radiology Reports Dg Chest Port 1 View  Result Date: 08/15/2018 CLINICAL DATA:  73 year old male with history of acute respiratory disease secondary to COVID-19 infection. Aspiration  pneumonia. EXAM: PORTABLE CHEST 1 VIEW COMPARISON:  Chest x-ray 08/12/2018. FINDINGS: Low lung volumes. Extensive airspace consolidation with air bronchograms and evidence of bronchiectasis in the left lower lobe. Other patchy areas of interstitial prominence and ill-defined airspace disease also noted in the right upper lobe and medial aspect of the right lung base. Small left pleural effusion. No evidence of pulmonary edema. Heart size is normal. Upper mediastinal contours are within normal limits. Aortic atherosclerosis. Postoperative changes of right upper extremity ORIF incompletely imaged. IMPRESSION: 1. Radiographic appearance the chest is essentially unchanged, as above, compatible with multilobar pneumonia. 2. Aortic atherosclerosis. Electronically Signed   By: Trudie Reed M.D.   On: 08/15/2018 08:21   Dg Chest Port 1 View  Result Date: 08/12/2018 CLINICAL DATA:  Acute respiratory failure EXAM: PORTABLE CHEST 1 VIEW COMPARISON:  Six days ago FINDINGS: Low volume chest. There has been mild improvement in opacity, especially about the right hilum. Reticular density at the right base is better seen, although this is also a more technically clear image. There is still extensive left-sided opacity with architectural distortion, scarring by 2018 chest CT. No edema, effusion, or pneumothorax. Stable heart size which is distorted by scarring. IMPRESSION: Mildly improved aeration. Background chronic lung disease with scarring and low volumes. Electronically Signed   By: Marnee Spring M.D.   On: 08/12/2018 05:07   Dg Chest Port 1 View  Result Date: 08/21/2018 CLINICAL DATA:  Fever.  COVID-19 exposure EXAM: PORTABLE CHEST 1 VIEW COMPARISON:  01/25/2017 and CT chest 01/25/2018 FINDINGS: Chronic lung disease with scarring based on prior CT. Superimposed patchy airspace disease bilaterally is new compared  with the prior study. No effusion. IMPRESSION: COPD with scarring Superimposed acute airspace disease  bilaterally suspicious for pneumonia. Electronically Signed   By: Marlan Palau M.D.   On: 2018-08-30 12:57   Dg Swallowing Func-speech Pathology  Result Date: 08/14/2018 Objective Swallowing Evaluation: Type of Study: MBS-Modified Barium Swallow Study  Patient Details Name: Latroy Gaymon MRN: [id number] Date of Birth: 1946/02/14 Today's Date: 08/14/2018 Time: SLP Start Time (ACUTE ONLY): 1345 -SLP Stop Time (ACUTE ONLY): 1420 SLP Time Calculation (min) (ACUTE ONLY): 35 min Past Medical History: Past Medical History: Diagnosis Date  Cerebral vascular disease   Dementia (HCC)   Schizo affective schizophrenia (HCC)   Seizures (HCC)  Past Surgical History: No past surgical history on file. HPI: Pt is a 73 yo male admitted from [facility] due to concern for psychiatric illness (pt felt like he was going crazy). He was noted to have a fever, tachycardia, and CXR concerning for PNA. COVID test was positive. PMH includes: CVA, dementia, seizures, schizophrenia, COPD. Pt was observed to cough with thin liquids on 5/18, therefore SLP swallow evaluation was ordered.  Subjective: pt provides conflicting information about PLOF but seems as though he eats very little that requires chewing Assessment / Plan / Recommendation CHL IP CLINICAL IMPRESSIONS 08/14/2018 Clinical Impression Pt presents with a moderate oral/oropharyngeal dysphagia with premature spillage of thin and nectar thick liquids over the base of tongue into the vestibule prior to an otherwise relatively timely and strong swallow initiation. Aspiration of thin liquids was sensed, but with nectar x2, silent with no response. Pt was able to achieve better bolus cohesion for transit with honey thick liquids. Cup sips were less coordinated as pt took several sips, pushing boluses to valleculae without initaiting a swallow. Recommend pt consume dys 2 solids (edentulous but able to masticate with gums) and honey thick liquids with a slow rate controlled by  supervising staff member. One sip at a time or spoon feeding as needed.  SLP Visit Diagnosis Dysphagia, unspecified (R13.10) Attention and concentration deficit following -- Frontal lobe and executive function deficit following -- Impact on safety and function --   CHL IP TREATMENT RECOMMENDATION 08/14/2018 Treatment Recommendations Therapy as outlined in treatment plan below   Prognosis 08/14/2018 Prognosis for Safe Diet Advancement Good Barriers to Reach Goals -- Barriers/Prognosis Comment -- CHL IP DIET RECOMMENDATION 08/14/2018 SLP Diet Recommendations Dysphagia 2 (Fine chop) solids;Honey thick liquids Liquid Administration via Spoon;Cup Medication Administration Crushed with puree Compensations Slow rate;Small sips/bites Postural Changes Remain semi-upright after after feeds/meals (Comment);Seated upright at 90 degrees   CHL IP OTHER RECOMMENDATIONS 08/14/2018 Recommended Consults -- Oral Care Recommendations Oral care BID Other Recommendations Order thickener from pharmacy   CHL IP FOLLOW UP RECOMMENDATIONS 08/14/2018 Follow up Recommendations Skilled Nursing facility   [facility] IP FREQUENCY AND DURATION 08/14/2018 Speech Therapy Frequency (ACUTE ONLY) min 2x/week Treatment Duration 2 weeks      No flowsheet data found. CHL IP PHARYNGEAL PHASE 08/14/2018 Pharyngeal Phase Impaired Pharyngeal- Pudding Teaspoon -- Pharyngeal -- Pharyngeal- Pudding Cup -- Pharyngeal -- Pharyngeal- Honey Teaspoon Pharyngeal residue - valleculae;Delayed swallow initiation-vallecula Pharyngeal -- Pharyngeal- Honey Cup Delayed swallow initiation-pyriform sinuses Pharyngeal -- Pharyngeal- Nectar Teaspoon Delayed swallow initiation-pyriform sinuses;Penetration/Aspiration before swallow;Trace aspiration Pharyngeal Material enters airway, passes BELOW cords without attempt by patient to eject out (silent aspiration) Pharyngeal- Nectar Cup -- Pharyngeal -- Pharyngeal- Nectar Straw -- Pharyngeal -- Pharyngeal- Thin Teaspoon -- Pharyngeal -- Pharyngeal-  Thin Cup -- Pharyngeal -- Pharyngeal- Thin Straw Penetration/Aspiration before  swallow;Moderate aspiration;Delayed swallow initiation-pyriform sinuses Pharyngeal Material enters airway, passes BELOW cords and not ejected out despite cough attempt by patient Pharyngeal- Puree Delayed swallow initiation-vallecula Pharyngeal -- Pharyngeal- Mechanical Soft Delayed swallow initiation-vallecula Pharyngeal -- Pharyngeal- Regular -- Pharyngeal -- Pharyngeal- Multi-consistency -- Pharyngeal -- Pharyngeal- Pill -- Pharyngeal -- Pharyngeal Comment --  CHL IP CERVICAL ESOPHAGEAL PHASE 08/14/2018 Cervical Esophageal Phase WFL Pudding Teaspoon -- Pudding Cup -- Honey Teaspoon -- Honey Cup -- Nectar Teaspoon -- Nectar Cup -- Nectar Straw -- Thin Teaspoon -- Thin Cup -- Thin Straw -- Puree -- Mechanical Soft -- Regular -- Multi-consistency -- Pill -- Cervical Esophageal Comment -- Harlon DittyBonnie DeBlois, MA CCC-SLP Acute Rehabilitation Services Pager [phone] Office [phone] Claudine MoutonDeBlois, Bonnie Caroline 08/14/2018, 3:50 PM

## 2018-08-24 NOTE — Progress Notes (Addendum)
Patient resting comfortably.  Reduced the patient from high flow 15L to [place] 3L. o2 stats have decreased to the 40's but the patient doesn't look like he is in any distress. Will continue to monitor.  1330-Patients respirations increased, bolused 1mg  of morphine. Will continue to monitor

## 2018-08-24 DEATH — deceased

## 2020-07-04 IMAGING — DX PORTABLE CHEST - 1 VIEW
1 series · 1 of 1 positions shown · non-contrast
Comparison: 01/25/2017 and CT chest 01/25/2018

CLINICAL DATA: Fever.  8ESYD-FK exposure

EXAM:
PORTABLE CHEST 1 VIEW

[chest ap]
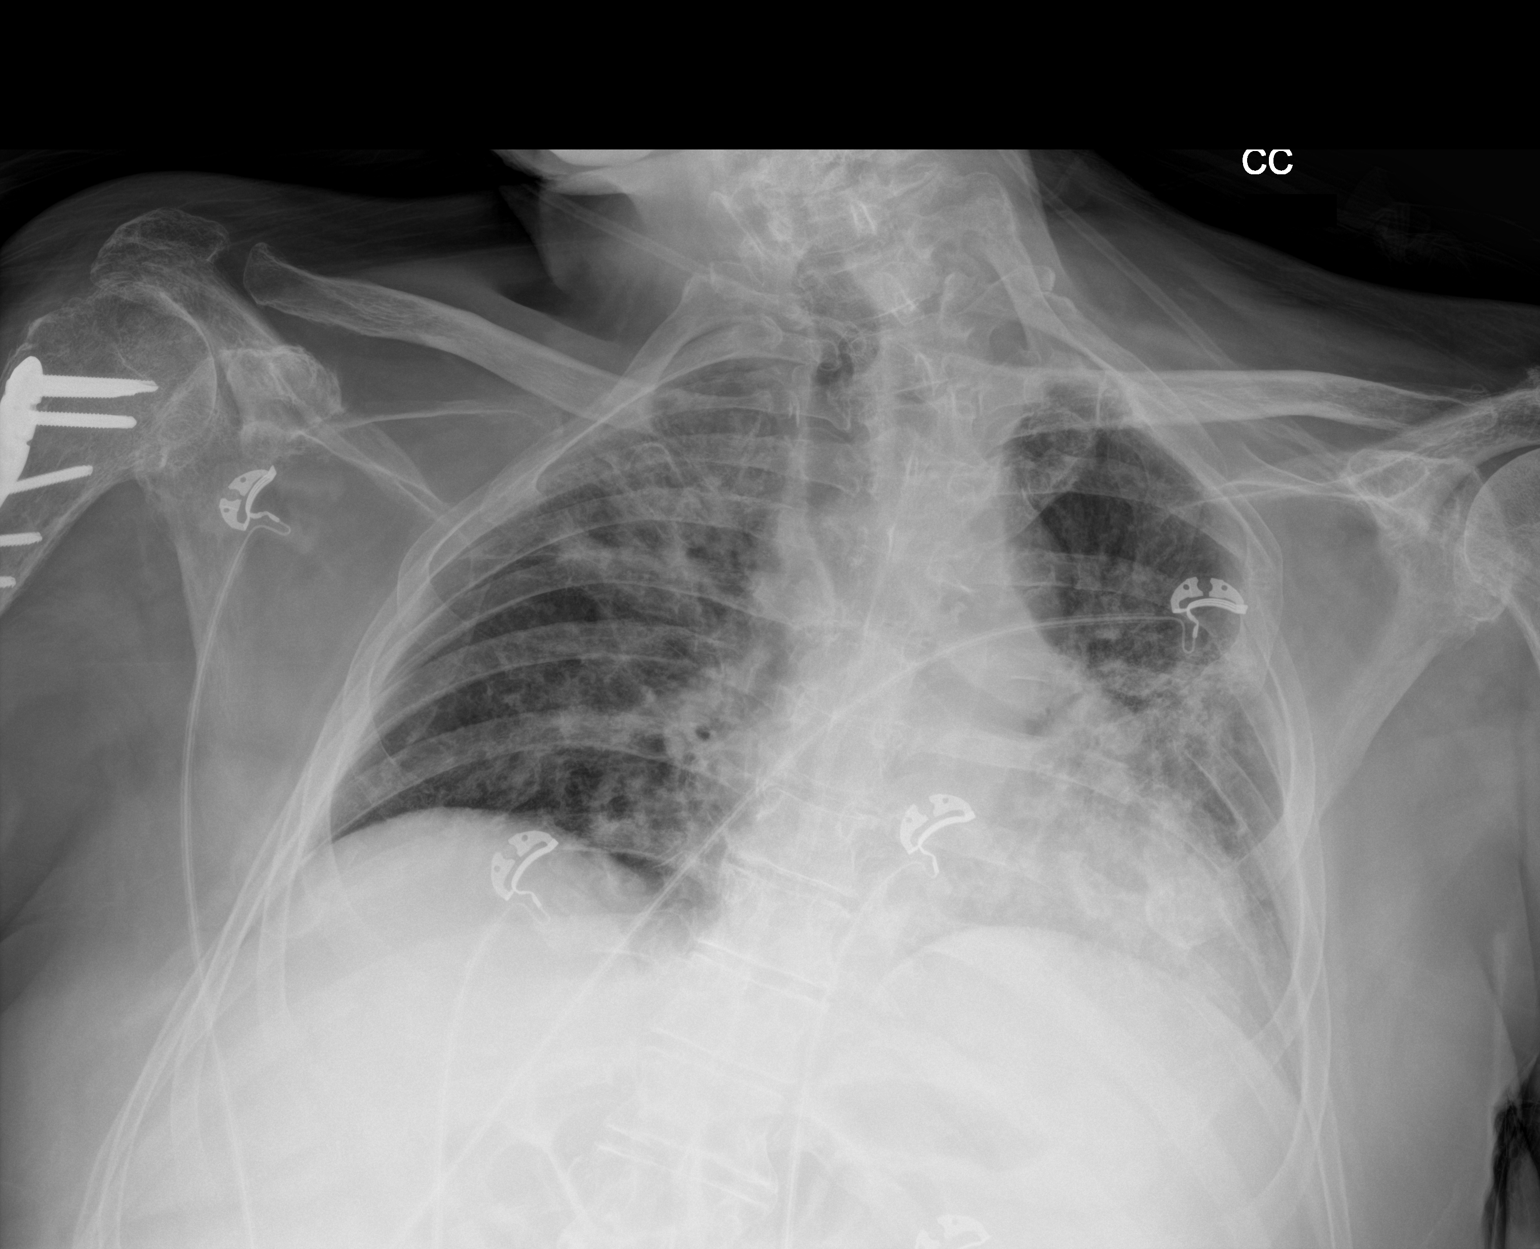

[1 of 1 positions shown; findings below may reference images not displayed]

FINDINGS: Chronic lung disease with scarring based on prior CT. Superimposed
patchy airspace disease bilaterally is new compared with the prior
study. No effusion.
IMPRESSION: COPD with scarring

Superimposed acute airspace disease bilaterally suspicious for
pneumonia.

## 2020-07-10 IMAGING — DX PORTABLE CHEST - 1 VIEW
1 series · 1 of 1 positions shown · non-contrast
Comparison: Six days ago

CLINICAL DATA: Acute respiratory failure

EXAM:
PORTABLE CHEST 1 VIEW

[chest]
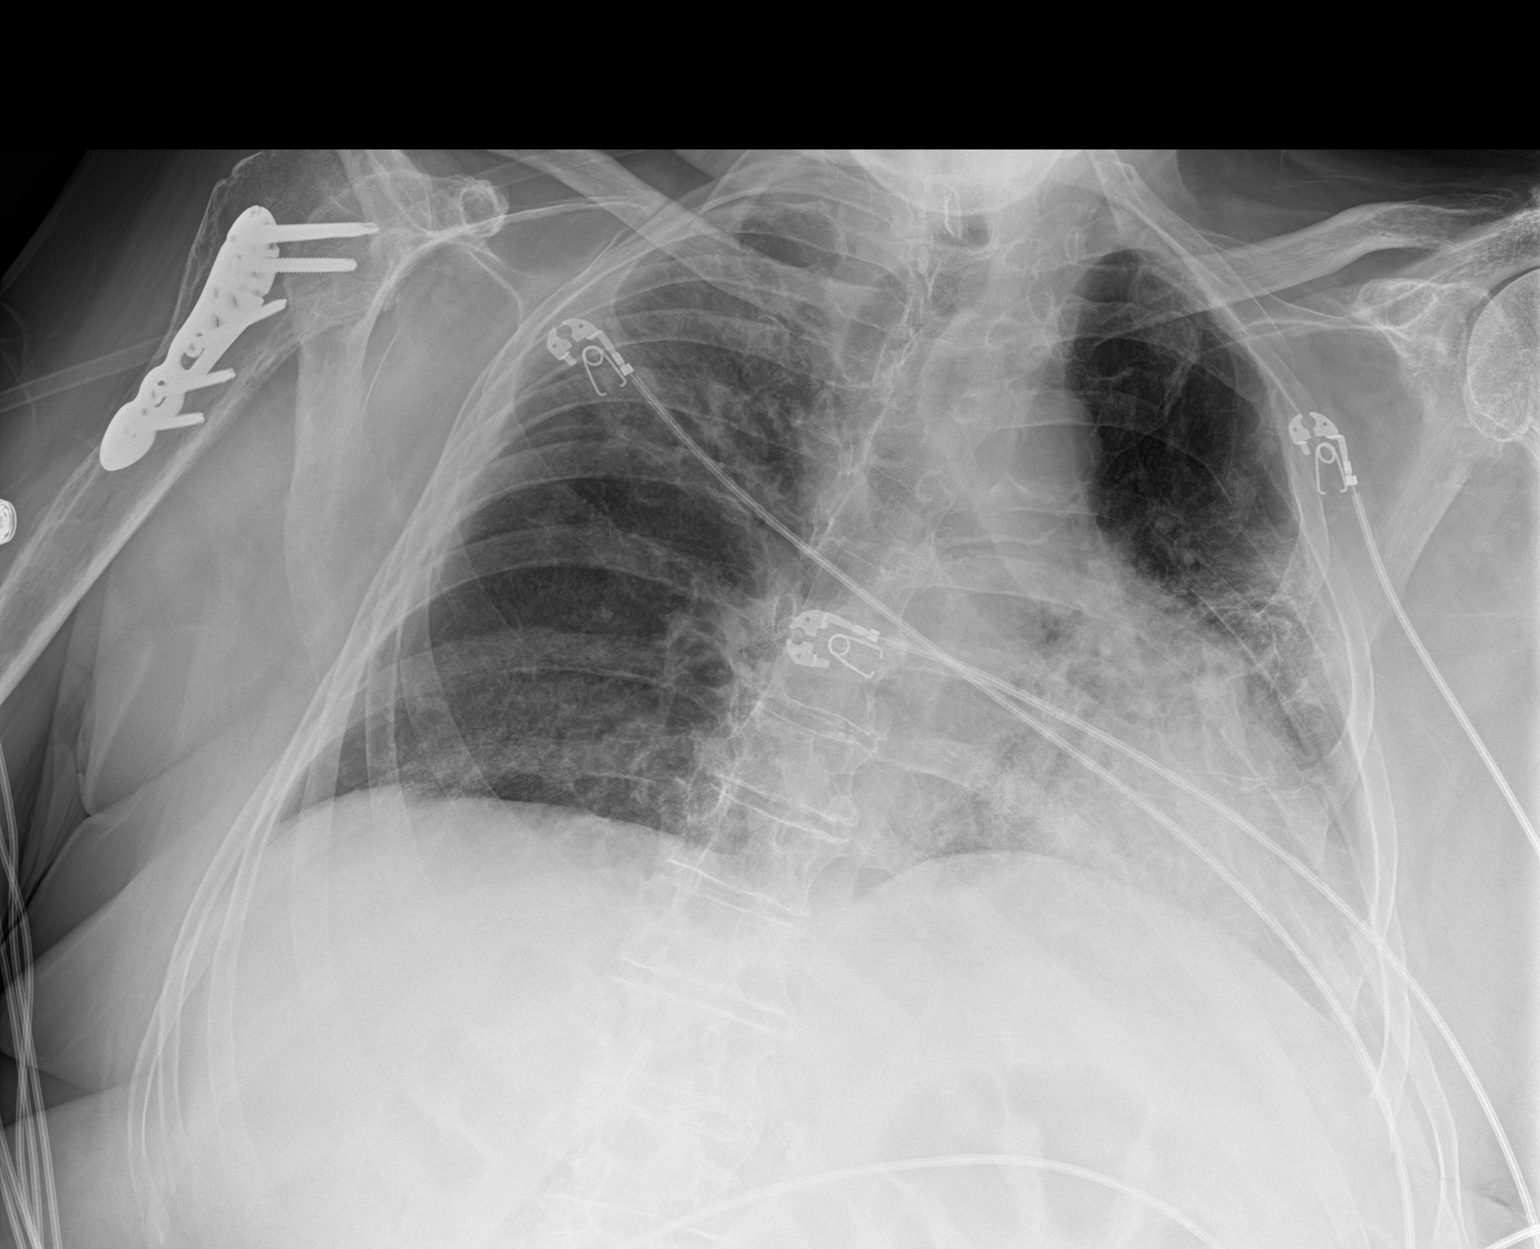

[1 of 1 positions shown; findings below may reference images not displayed]

FINDINGS: Low volume chest. There has been mild improvement in opacity,
especially about the right hilum. Reticular density at the right
base is better seen, although this is also a more technically clear
image. There is still extensive left-sided opacity with
architectural distortion, scarring by 8315 chest CT. No edema,
effusion, or pneumothorax. Stable heart size which is distorted by
scarring.
IMPRESSION: Mildly improved aeration.

Background chronic lung disease with scarring and low volumes.
